# Patient Record
Sex: Female | Born: 1975 | Race: White | Hispanic: Yes | Marital: Married | Smoking: Never smoker
Health system: Southern US, Community
[De-identification: ages and names within clinical notes are randomized; demographics above are authoritative.]

## PROBLEM LIST (undated history)

## (undated) ENCOUNTER — Emergency Department (HOSPITAL_COMMUNITY): Admission: EM | Payer: Self-pay | Source: Home / Self Care

## (undated) ENCOUNTER — Inpatient Hospital Stay (HOSPITAL_COMMUNITY): Payer: Self-pay

## (undated) DIAGNOSIS — Z789 Other specified health status: Secondary | ICD-10-CM

## (undated) DIAGNOSIS — K3533 Acute appendicitis with perforation and localized peritonitis, with abscess: Secondary | ICD-10-CM

## (undated) DIAGNOSIS — R7303 Prediabetes: Secondary | ICD-10-CM

## (undated) DIAGNOSIS — E119 Type 2 diabetes mellitus without complications: Secondary | ICD-10-CM

## (undated) HISTORY — DX: Type 2 diabetes mellitus without complications: E11.9

## (undated) HISTORY — PX: APPENDECTOMY: SHX54

## (undated) HISTORY — DX: Prediabetes: R73.03

---

## 2001-04-15 ENCOUNTER — Encounter: Payer: Self-pay | Admitting: Emergency Medicine

## 2001-04-15 ENCOUNTER — Emergency Department (HOSPITAL_COMMUNITY): Admission: EM | Admit: 2001-04-15 | Discharge: 2001-04-15 | Payer: Self-pay | Admitting: Emergency Medicine

## 2005-09-28 ENCOUNTER — Emergency Department (HOSPITAL_COMMUNITY): Admission: EM | Admit: 2005-09-28 | Discharge: 2005-09-28 | Payer: Self-pay | Admitting: Emergency Medicine

## 2005-10-03 ENCOUNTER — Emergency Department (HOSPITAL_COMMUNITY): Admission: EM | Admit: 2005-10-03 | Discharge: 2005-10-03 | Payer: Self-pay | Admitting: Family Medicine

## 2007-12-16 ENCOUNTER — Ambulatory Visit (HOSPITAL_COMMUNITY): Admission: RE | Admit: 2007-12-16 | Discharge: 2007-12-16 | Payer: Self-pay | Admitting: Obstetrics & Gynecology

## 2008-05-07 ENCOUNTER — Ambulatory Visit: Payer: Self-pay | Admitting: Obstetrics & Gynecology

## 2008-05-07 ENCOUNTER — Ambulatory Visit: Payer: Self-pay | Admitting: Obstetrics and Gynecology

## 2008-05-07 ENCOUNTER — Inpatient Hospital Stay (HOSPITAL_COMMUNITY): Admission: AD | Admit: 2008-05-07 | Discharge: 2008-05-09 | Payer: Self-pay | Admitting: Obstetrics & Gynecology

## 2008-05-08 ENCOUNTER — Encounter (HOSPITAL_COMMUNITY): Payer: Self-pay | Admitting: Obstetrics and Gynecology

## 2010-01-14 ENCOUNTER — Emergency Department (HOSPITAL_COMMUNITY): Admission: EM | Admit: 2010-01-14 | Discharge: 2010-01-14 | Payer: Self-pay | Admitting: Emergency Medicine

## 2010-10-17 ENCOUNTER — Emergency Department (HOSPITAL_COMMUNITY)
Admission: EM | Admit: 2010-10-17 | Discharge: 2010-10-17 | Payer: Self-pay | Source: Home / Self Care | Admitting: Emergency Medicine

## 2011-01-12 LAB — DIFFERENTIAL
Basophils Absolute: 0.1 10*3/uL (ref 0.0–0.1)
Basophils Relative: 1 % (ref 0–1)
Eosinophils Absolute: 1 10*3/uL — ABNORMAL HIGH (ref 0.0–0.7)
Monocytes Relative: 4 % (ref 3–12)
Neutro Abs: 9.7 10*3/uL — ABNORMAL HIGH (ref 1.7–7.7)
Neutrophils Relative %: 72 % (ref 43–77)

## 2011-01-12 LAB — URINALYSIS, ROUTINE W REFLEX MICROSCOPIC
Bilirubin Urine: NEGATIVE
Glucose, UA: NEGATIVE mg/dL
Ketones, ur: NEGATIVE mg/dL
Nitrite: NEGATIVE
Protein, ur: 30 mg/dL — AB
Specific Gravity, Urine: 1.033 — ABNORMAL HIGH (ref 1.005–1.030)
Urobilinogen, UA: 0.2 mg/dL (ref 0.0–1.0)
pH: 8.5 — ABNORMAL HIGH (ref 5.0–8.0)

## 2011-01-12 LAB — BASIC METABOLIC PANEL
BUN: 20 mg/dL (ref 6–23)
CO2: 25 mEq/L (ref 19–32)
Calcium: 9.2 mg/dL (ref 8.4–10.5)
Creatinine, Ser: 0.66 mg/dL (ref 0.4–1.2)
GFR calc non Af Amer: >60 mL/min (ref >60)
Glucose, Bld: 137 mg/dL — ABNORMAL HIGH (ref 70–99)
Sodium: 139 mEq/L (ref 135–145)

## 2011-01-12 LAB — URINE MICROSCOPIC-ADD ON

## 2011-01-12 LAB — CBC
MCH: 31 pg (ref 26.0–34.0)
MCHC: 34 g/dL (ref 30.0–36.0)
Platelets: 308 10*3/uL (ref 150–400)
RDW: 12.9 % (ref 11.5–15.5)

## 2011-01-12 LAB — POCT PREGNANCY, URINE: Preg Test, Ur: NEGATIVE

## 2011-01-26 LAB — URINALYSIS, ROUTINE W REFLEX MICROSCOPIC
Glucose, UA: NEGATIVE mg/dL
Ketones, ur: NEGATIVE mg/dL
Leukocytes, UA: NEGATIVE
Protein, ur: 30 mg/dL — AB
Urobilinogen, UA: 0.2 mg/dL (ref 0.0–1.0)

## 2011-01-26 LAB — COMPREHENSIVE METABOLIC PANEL
Albumin: 4.4 g/dL (ref 3.5–5.2)
Alkaline Phosphatase: 69 U/L (ref 39–117)
BUN: 17 mg/dL (ref 6–23)
Calcium: 8.4 mg/dL (ref 8.4–10.5)
Creatinine, Ser: 0.69 mg/dL (ref 0.4–1.2)
Potassium: 3.8 mEq/L (ref 3.5–5.1)
Total Protein: 8.2 g/dL (ref 6.0–8.3)

## 2011-01-26 LAB — CBC
Hemoglobin: 14.6 g/dL (ref 12.0–15.0)
MCV: 92.4 fL (ref 78.0–100.0)
Platelets: 255 10*3/uL (ref 150–400)
RBC: 4.65 MIL/uL (ref 3.87–5.11)
RDW: 13.5 % (ref 11.5–15.5)
WBC: 13.4 10*3/uL — ABNORMAL HIGH (ref 4.0–10.5)

## 2011-01-26 LAB — DIFFERENTIAL
Lymphocytes Relative: 8 % — ABNORMAL LOW (ref 12–46)
Lymphs Abs: 1.1 10*3/uL (ref 0.7–4.0)
Monocytes Absolute: 0.8 10*3/uL (ref 0.1–1.0)
Monocytes Relative: 6 % (ref 3–12)
Neutro Abs: 10.4 10*3/uL — ABNORMAL HIGH (ref 1.7–7.7)
Neutrophils Relative %: 77 % (ref 43–77)

## 2011-01-26 LAB — URINE MICROSCOPIC-ADD ON

## 2011-07-30 LAB — CBC
HCT: 36.8
Hemoglobin: 12.8
MCHC: 34.6
MCV: 96.5
RBC: 3.82 — ABNORMAL LOW
RDW: 14.7

## 2011-11-03 NOTE — L&D Delivery Note (Cosign Needed)
  Carrie Buchanan, Carrie Buchanan [[id number]]  Delivery Note At 7:31 PM a viable female was delivered via Vaginal, Spontaneous Delivery (Presentation: OA).  APGAR: 9, 9; weight 6 lb 2.4 oz (2790 g).   Placenta status: intact, normal, to pathology.  Cord: 3 vessels with the following complications: None.  Anesthesia: Epidural  Episiotomy: None Lacerations: None Est. Blood Loss (mL): 450  Mom to postpartum.  Baby to nursery-stable.  [name] 04/19/2012, 7:56 PM     [name], Colin Broach [[id number]]  Delivery Note At 7:38 PM a viable female was delivered via  (Presentation: double footling breech extraction).  APGAR: 5, 9; weight 6 lb 7 oz (2920 g).   Placenta status: intact, normal, to pathology.  Cord:  with the following complications: None  Anesthesia:  See above Lacerations: None Est. Blood Loss (mL): 450  Mom to postpartum.  Baby to nursery-stable.  [name] 04/19/2012, 7:56 PM  Supervised by Dr. Shawnie Pons and Wynelle Bourgeois, CNM

## 2011-12-14 ENCOUNTER — Other Ambulatory Visit (HOSPITAL_COMMUNITY): Payer: Self-pay | Admitting: Physician Assistant

## 2011-12-14 DIAGNOSIS — Z3689 Encounter for other specified antenatal screening: Secondary | ICD-10-CM

## 2011-12-14 LAB — CYTOLOGY - NON PAP: CYTOLOGY - PAP: NEGATIVE

## 2011-12-14 LAB — CBC
HCT: 37 % (ref 36–46)
Hemoglobin: 11.5 g/dL — AB (ref 12.0–16.0)
Platelets: 260 10*3/uL (ref 150–399)

## 2011-12-14 LAB — RUBELLA ANTIBODY, IGM: Rubella: IMMUNE

## 2011-12-14 LAB — HIV ANTIBODY (ROUTINE TESTING W REFLEX): HIV: NONREACTIVE

## 2011-12-14 LAB — ABO/RH

## 2011-12-17 ENCOUNTER — Ambulatory Visit (HOSPITAL_COMMUNITY)
Admission: RE | Admit: 2011-12-17 | Discharge: 2011-12-17 | Disposition: A | Discharge status: Home / Self Care | Payer: Medicaid Other | Source: Ambulatory Visit | Attending: Physician Assistant | Admitting: Physician Assistant

## 2011-12-17 ENCOUNTER — Other Ambulatory Visit (HOSPITAL_COMMUNITY): Payer: Self-pay | Admitting: Physician Assistant

## 2011-12-17 DIAGNOSIS — O358XX0 Maternal care for other (suspected) fetal abnormality and damage, not applicable or unspecified: Secondary | ICD-10-CM | POA: Insufficient documentation

## 2011-12-17 DIAGNOSIS — Z363 Encounter for antenatal screening for malformations: Secondary | ICD-10-CM | POA: Insufficient documentation

## 2011-12-17 DIAGNOSIS — Z3689 Encounter for other specified antenatal screening: Secondary | ICD-10-CM

## 2011-12-17 DIAGNOSIS — Z1389 Encounter for screening for other disorder: Secondary | ICD-10-CM | POA: Insufficient documentation

## 2011-12-17 DIAGNOSIS — O09299 Supervision of pregnancy with other poor reproductive or obstetric history, unspecified trimester: Secondary | ICD-10-CM | POA: Insufficient documentation

## 2012-01-01 DIAGNOSIS — O0930 Supervision of pregnancy with insufficient antenatal care, unspecified trimester: Secondary | ICD-10-CM | POA: Insufficient documentation

## 2012-01-01 DIAGNOSIS — O30049 Twin pregnancy, dichorionic/diamniotic, unspecified trimester: Secondary | ICD-10-CM

## 2012-01-01 DIAGNOSIS — O0990 Supervision of high risk pregnancy, unspecified, unspecified trimester: Secondary | ICD-10-CM | POA: Insufficient documentation

## 2012-01-01 DIAGNOSIS — O30009 Twin pregnancy, unspecified number of placenta and unspecified number of amniotic sacs, unspecified trimester: Secondary | ICD-10-CM | POA: Insufficient documentation

## 2012-01-01 DIAGNOSIS — O09529 Supervision of elderly multigravida, unspecified trimester: Secondary | ICD-10-CM | POA: Insufficient documentation

## 2012-01-04 ENCOUNTER — Ambulatory Visit (INDEPENDENT_AMBULATORY_CARE_PROVIDER_SITE_OTHER): Payer: Self-pay | Admitting: Obstetrics & Gynecology

## 2012-01-04 DIAGNOSIS — O09529 Supervision of elderly multigravida, unspecified trimester: Secondary | ICD-10-CM

## 2012-01-04 DIAGNOSIS — O0990 Supervision of high risk pregnancy, unspecified, unspecified trimester: Secondary | ICD-10-CM

## 2012-01-04 DIAGNOSIS — O0930 Supervision of pregnancy with insufficient antenatal care, unspecified trimester: Secondary | ICD-10-CM

## 2012-01-04 DIAGNOSIS — O30009 Twin pregnancy, unspecified number of placenta and unspecified number of amniotic sacs, unspecified trimester: Secondary | ICD-10-CM

## 2012-01-04 DIAGNOSIS — O30049 Twin pregnancy, dichorionic/diamniotic, unspecified trimester: Secondary | ICD-10-CM

## 2012-01-04 LAB — POCT URINALYSIS DIP (DEVICE)
Bilirubin Urine: NEGATIVE
Ketones, ur: NEGATIVE mg/dL
Protein, ur: NEGATIVE mg/dL
Specific Gravity, Urine: 1.02 (ref 1.005–1.030)
pH: 7 (ref 5.0–8.0)

## 2012-01-04 NOTE — Progress Notes (Signed)
U/S scheduled January 14, 2012 at 1030am

## 2012-01-04 NOTE — Progress Notes (Signed)
First visit at [facility].  No problems thus far.  Last scan was on 2/14, next scan to be scheduled.  No other complaints or concerns.  Fetal movement and labor precautions reviewed.  To meet with SW and Nutrition today.

## 2012-01-04 NOTE — Progress Notes (Signed)
Nutrition Note: (1st [facility] consult) Pt seen today for 1st nutrition consult. Dx. Twin gestation, overweight prior to pregnancy, late [facility], AMA. Pt reports pg wt of 140#, current wt of 142.5# @ [redacted]w[redacted]d gestation is inadequate and plots 10#< expected.  Pt reports good intake with no N/V, no food allergies.  Pt eats a wide variety of foods and reports 4 meals/24 hours.  Pt agrees to increase overall intake to 5-6 smaller meals.   Pt does receive WIC services and plans to BF. Follow up in 4 weeks.  Cy Blamer, RD

## 2012-01-04 NOTE — Patient Instructions (Signed)
Return to clinic for any obstetric concerns or go to [facility] for evaluation  

## 2012-01-04 NOTE — Progress Notes (Signed)
Pulse 92 Pain in back and legs

## 2012-01-05 ENCOUNTER — Encounter: Payer: Self-pay | Admitting: *Deleted

## 2012-01-14 ENCOUNTER — Ambulatory Visit (HOSPITAL_COMMUNITY)
Admission: RE | Admit: 2012-01-14 | Discharge: 2012-01-14 | Disposition: A | Discharge status: Home / Self Care | Payer: Medicaid Other | Source: Ambulatory Visit | Attending: Obstetrics & Gynecology | Admitting: Obstetrics & Gynecology

## 2012-01-14 ENCOUNTER — Inpatient Hospital Stay (HOSPITAL_COMMUNITY): Payer: Medicaid Other

## 2012-01-14 ENCOUNTER — Other Ambulatory Visit: Payer: Self-pay | Admitting: Obstetrics & Gynecology

## 2012-01-14 ENCOUNTER — Inpatient Hospital Stay (HOSPITAL_COMMUNITY)
Admission: AD | Admit: 2012-01-14 | Discharge: 2012-01-22 | DRG: 782 | Disposition: A | Discharge status: Home / Self Care | Payer: Medicaid Other | Attending: Obstetrics & Gynecology | Admitting: Obstetrics & Gynecology

## 2012-01-14 DIAGNOSIS — O0930 Supervision of pregnancy with insufficient antenatal care, unspecified trimester: Secondary | ICD-10-CM

## 2012-01-14 DIAGNOSIS — O09529 Supervision of elderly multigravida, unspecified trimester: Secondary | ICD-10-CM

## 2012-01-14 DIAGNOSIS — O30049 Twin pregnancy, dichorionic/diamniotic, unspecified trimester: Secondary | ICD-10-CM

## 2012-01-14 DIAGNOSIS — O30009 Twin pregnancy, unspecified number of placenta and unspecified number of amniotic sacs, unspecified trimester: Secondary | ICD-10-CM | POA: Insufficient documentation

## 2012-01-14 DIAGNOSIS — O0990 Supervision of high risk pregnancy, unspecified, unspecified trimester: Secondary | ICD-10-CM

## 2012-01-14 DIAGNOSIS — O26879 Cervical shortening, unspecified trimester: Secondary | ICD-10-CM

## 2012-01-14 LAB — TYPE AND SCREEN
ABO/RH(D): O POS
Antibody Screen: NEGATIVE

## 2012-01-14 LAB — CBC
MCHC: 33.9 g/dL (ref 30.0–36.0)
Platelets: 260 10*3/uL (ref 150–400)
RDW: 14.4 % (ref 11.5–15.5)
WBC: 12.2 10*3/uL — ABNORMAL HIGH (ref 4.0–10.5)

## 2012-01-14 LAB — BASIC METABOLIC PANEL
BUN: 9 mg/dL (ref 6–23)
CO2: 22 mEq/L (ref 19–32)
GFR calc non Af Amer: >90 mL/min (ref >90)
Glucose, Bld: 89 mg/dL (ref 70–99)
Potassium: 3.9 mEq/L (ref 3.5–5.1)

## 2012-01-14 LAB — WET PREP, GENITAL
Clue Cells Wet Prep HPF POC: NONE SEEN
Trich, Wet Prep: NONE SEEN
Yeast Wet Prep HPF POC: NONE SEEN

## 2012-01-14 MED ORDER — MAGNESIUM SULFATE 40 G IN LACTATED RINGERS - SIMPLE
2.0000 g/h | INTRAVENOUS | Status: DC
  Administered 2012-01-15 – 2012-01-16 (×2): 2 g/h via INTRAVENOUS
  Filled 2012-01-14 (×3): qty 500

## 2012-01-14 MED ORDER — ZOLPIDEM TARTRATE 10 MG PO TABS: 10.0000 mg | ORAL_TABLET | Freq: Every evening | ORAL | Status: DC | PRN

## 2012-01-14 MED ORDER — DOCUSATE SODIUM 100 MG PO CAPS
100.0000 mg | ORAL_CAPSULE | Freq: Every day | ORAL | Status: DC
  Administered 2012-01-14 – 2012-01-22 (×9): 100 mg via ORAL
  Filled 2012-01-14 (×8): qty 1

## 2012-01-14 MED ORDER — PRENATAL MULTIVITAMIN CH
1.0000 | ORAL_TABLET | Freq: Every day | ORAL | Status: DC
  Administered 2012-01-14 – 2012-01-22 (×9): 1 via ORAL
  Filled 2012-01-14 (×9): qty 1

## 2012-01-14 MED ORDER — BETAMETHASONE SOD PHOS & ACET 6 (3-3) MG/ML IJ SUSP
12.0000 mg | INTRAMUSCULAR | Status: AC
  Administered 2012-01-14 – 2012-01-15 (×2): 12 mg via INTRAMUSCULAR
  Filled 2012-01-14 (×2): qty 2

## 2012-01-14 MED ORDER — MAGNESIUM SULFATE BOLUS VIA INFUSION
6.0000 g | Freq: Once | INTRAVENOUS | Status: AC
  Administered 2012-01-14: 6 g via INTRAVENOUS
  Filled 2012-01-14: qty 500

## 2012-01-14 MED ORDER — CALCIUM CARBONATE ANTACID 500 MG PO CHEW: 2.0000 | CHEWABLE_TABLET | ORAL | Status: DC | PRN

## 2012-01-14 MED ORDER — PENICILLIN G POTASSIUM 5000000 UNITS IJ SOLR
5.0000 10*6.[IU] | Freq: Once | INTRAVENOUS | Status: DC
  Administered 2012-01-14: 5 10*6.[IU] via INTRAVENOUS
  Filled 2012-01-14: qty 5

## 2012-01-14 MED ORDER — ACETAMINOPHEN 325 MG PO TABS: 650.0000 mg | ORAL_TABLET | ORAL | Status: DC | PRN

## 2012-01-14 MED ORDER — PROGESTERONE MICRONIZED 200 MG PO CAPS
200.0000 mg | ORAL_CAPSULE | Freq: Every day | ORAL | Status: DC
  Administered 2012-01-14 – 2012-01-21 (×8): 200 mg via VAGINAL
  Filled 2012-01-14 (×9): qty 1

## 2012-01-14 MED ORDER — PENICILLIN G POTASSIUM 5000000 UNITS IJ SOLR
2.5000 10*6.[IU] | INTRAVENOUS | Status: DC
  Administered 2012-01-14 – 2012-01-18 (×21): 2.5 10*6.[IU] via INTRAVENOUS
  Filled 2012-01-14 (×25): qty 2.5

## 2012-01-14 MED ORDER — LACTATED RINGERS IV SOLN
INTRAVENOUS | Status: DC
  Administered 2012-01-14 – 2012-01-15 (×2): via INTRAVENOUS
  Administered 2012-01-15: 100 mL/h via INTRAVENOUS
  Administered 2012-01-16 – 2012-01-17 (×4): via INTRAVENOUS

## 2012-01-14 NOTE — Progress Notes (Signed)
Pt. C/o of SOB, pulse ox placed on left index finger, sats 99-100%, lung clear bi-laterally. Dr. Macon Large notified - no new orders received.

## 2012-01-14 NOTE — Consult Note (Signed)
Asked to provide prenatal consultation for patient at risk for preterm delivery of twins.  Mother is 35y.o. G7 P5 Ab1 who is now 24.[redacted] weeks EGA, with pregnancy previously otherwise uncomplicated but cervical shortening noted on routine [place] today.  She is being treated with betamethasone, mag SO4, and PCN (pending GBS results).  With assistance of interpreter I discussed with patient and FOB usual expectations for preterm infants at 24+ weeks gestation, including possible needs for DR resuscitation, respiratory support, IV access, and blood products.  Also presented risks of death or serious morbidity, including brain, lung, gut, or eye complications. Projected possible length of stay in NICU until 36 - [redacted] wks EGA.  Discussed advantages of feeding with mother's milk, and encouraged her to consider pumping postnatally (she intends to pump and breast feed).  Patient was attentive, FOB also attentive and had appropriate questions, thanked me for my input.  Thank you for the consultation.

## 2012-01-14 NOTE — Consult Note (Signed)
MATERNAL FETAL MEDICINE CONSULT  Patient Name: Carrie Buchanan Medical Record Number:  [id number] Date of Birth: 1976-08-18 Requesting Physician Name:  Tereso Newcomer, MD Date of Service: 01/14/2012  Chief Complaint Cervical Insufficiency  History of Present Illness Carrie Buchanan was seen today due to cervical insufficiency at the request of Tereso Newcomer, MD.  The patient is a 36 y.o. [id number], pregnancy with dichorionic diamniotic twins at [redacted]w[redacted]d with an EDD of 05/04/2012, by Ultrasound dating method.  She was seen earlier today for a routine ultrasound and was noted to have a cervical length of 0.9 cm. She denies pain, cramping, contractions, vaginal bleeding, or leakage of fluid. Her fetuses are moving normally.   Review of Systems Pertinent items are noted in HPI.  Patient History OB History    Grav Para Term Preterm Abortions TAB SAB Ect Mult Living   7 5 5  1  1   5      # Outc Date GA Lbr Len/2nd Wgt Sex Del Anes PTL Lv   1 TRM 1993 [redacted]w[redacted]d 05:00 6lb(2.722kg) F SVD None No    2 TRM 1994 [redacted]w[redacted]d 04:00 7lb(3.175kg) M SVD None No Yes   3 TRM 1996 [redacted]w[redacted]d 04:00 7lb(3.175kg) M SVD None No Yes   4 TRM 2000 [redacted]w[redacted]d 40:00 6lb(2.722kg) M SVD None  Yes   5 SAB 2004 [redacted]w[redacted]d          6 TRM 2009 [redacted]w[redacted]d 05:00 7lb(3.175kg) F SVD   Yes   Comments: induction   7 CUR               No past medical history on file.  No past surgical history on file.  History   Social History  . Marital Status: Single    Spouse Name: N/A    Number of Children: N/A  . Years of Education: N/A   Social History Main Topics  . Smoking status: Never Smoker   . Smokeless tobacco: Not on file  . Alcohol Use: No  . Drug Use: No  . Sexually Active: Yes   Other Topics Concern  . Not on file   Social History Narrative  . No narrative on file    Family History  Problem Relation Age of Onset  . Asthma Mother    In addition, the patient has no family history of recurrent miscarriage, cervical  insufficiency mental retardation, birth defects, or genetic diseases.  Physical Examination Filed Vitals:   01/14/12 1240  BP: 111/61  Pulse: 77  Temp: 98.4 F (36.9 C)  Resp: 18   General appearance - alert, well appearing, and in no distress Abdomen - soft, nontender, nondistended, no masses or organomegaly  Assessment and Recommendations 1. Cervical Insufficiency. As the patient is already receiving magnesium sulfate for neuroprotection and betamethasone to accelerate fetal maturity, I have few additional recommendations as there are no other interventions of proven benefit in this situation. A cerclage is contra-indicated in this situation both due to her gestational age and twin pregnancy. If rupture of membranes occurs and/or labor ensues a vaginal delivery can be attempted as the presenting fetus is vertex. However, the second twin is not a candidate for a breech vaginal delivery due to its small size and high risk of head entrapment. Thus, performing a primary cesarean if delivery is required would be a reasonable alternative to attempting a vaginal delivery.  Rema Fendt, MD

## 2012-01-14 NOTE — H&P (Signed)
Attending Antepartum Admission History and Physical  Carrie Buchanan is a 36 y.o. [phone] with di/di twin gestation at [redacted]w[redacted]d admitted after only 0.9cm cervical length was noted on routine ultrasound today. Patient denies any contractions, LOF, VB. Reports good FM x 2. Twins were noted to have good FHR, good concordance in weight, normal AFV x2, no other concerning signs or symptoms. Internal os was noted to be dilated to about 3.2 cm with funnelling, 0.9cm measurable cervical length. Fetal presentation is cephalic for Twin A and transverse, head maternal right for Twin B.  Patient Active Problem List  Diagnoses  . Twin pregnancy, antepartum  . AMA (advanced maternal age) multigravida 35+  . Late prenatal care  . High-risk pregnancy supervision  . Short cervix, antepartum, twins   Past Medical History: No past medical history on file.  Past Surgical History: No past surgical history on file.  Obstetrical History: OB History    Grav Para Term Preterm Abortions TAB SAB Ect Mult Living   7 5 5  1  1   5     SVD at term x 5, no complications.  SAB x 1  Gynecological History: Noncontributory. No history of cervical dysplasia or STDs.  Social History: History   Social History  . Marital Status: Single    Spouse Name: N/A    Number of Children: N/A  . Years of Education: N/A   Social History Main Topics  . Smoking status: Never Smoker   . Smokeless tobacco: Not on file  . Alcohol Use: No  . Drug Use: No  . Sexually Active: Yes   Other Topics Concern  . Not on file   Social History Narrative  . No narrative on file   Family History: Family History  Problem Relation Age of Onset  . Asthma Mother    Allergies: No Known Allergies  Prescriptions prior to admission  Medication Sig Dispense Refill  . prenatal vitamin w/FE, FA (PRENATAL 1 + 1) 27-1 MG TABS Take 1 tablet by mouth daily.       Review of Systems: Negative  Vitals:  Temperature 98.4 F (36.9 C),  temperature source Oral, resp. rate 18, height 4\' 11"  (1.499 m). Physical Examination:  General appearance: alert, well appearing, and in no distress  Chest: clear to auscultation, no wheezes, rales or rhonchi, symmetric air entry  Heart: normal rate and regular rhythm  Abdomen: gravid and fundal height is consistent with twins  Cervix: Evaluated by sterile speculum exam., Position: mid position, Dilation: 0 cm visually Thickness: 70% effaced and Consistency: soft Station: Floating and fetal presentation is cephalic and transverse on ultrasound.  Extremities: extremities normal, atraumatic, no cyanosis or edema with DTRs 2+ bilaterally  Membranes:intact  Fetal Monitoring: Baseline is 130s for Twin A, 140s for Twin B; moderate variability x 2, no accels x 2, no decels x 2  Labs:  No results found for this or any previous visit (from the past 24 hour(s)).  Imaging Studies: 12/17/2011  OBSTETRICAL ULTRASOUND:  Twin A EFW 770g/71%, normal AFV, cephalic, normal anatomy, female; and Twin B EFW 697g/58%, normal AFV, tranverse, normal anatomy, female; 9% discordance ; 0.9 cm cervical length, internal os open to 3.2 cm, funneling noted.    . betamethasone acetate-betamethasone sodium phosphate  12 mg Intramuscular Q24H  . docusate sodium  100 mg Oral Daily  . magnesium  6 g Intravenous Once  . pencillin G potassium IV  5 Million Units Intravenous Once   Followed by  . Robley Fries  G potassium IV  2.5 Million Units Intravenous Q4H  . prenatal multivitamin  1 tablet Oral Daily  . progesterone  200 mg Vaginal QHS   I have reviewed the patient's current medications.  ASSESSMENT AND PLAN: Patient Active Problem List  Diagnoses  . Twin pregnancy, antepartum  . AMA (advanced maternal age) multigravida 35+  . Late prenatal care  . High-risk pregnancy supervision  . Short cervix, antepartum, twins  Admit to Antenatal  Bedrest, Trendelenberg position, admission labs ordered  BMZ regimen, magnesium  sulfate ordered for tocolysis and neuroprotection  Progesterone per vagina ordered to help with cervix  PCN ordered for GBS unknown in setting of prematurity  NICU and MFM consults ordered  Routine antenatal care  Jaynie Collins, M.D. 01/14/2012 1:04 PM

## 2012-01-15 ENCOUNTER — Encounter (HOSPITAL_COMMUNITY): Payer: Self-pay | Admitting: *Deleted

## 2012-01-15 DIAGNOSIS — O26879 Cervical shortening, unspecified trimester: Principal | ICD-10-CM

## 2012-01-15 LAB — GC/CHLAMYDIA PROBE AMP, GENITAL: Chlamydia, DNA Probe: NEGATIVE

## 2012-01-15 NOTE — Progress Notes (Signed)
FACULTY PRACTICE ANTEPARTUM(COMPREHENSIVE) NOTE  Carrie Buchanan is a 36 y.o. [phone] with di/di twin gestation at [redacted]w[redacted]d  admitted after only 0.9cm cervical length was noted on routine ultrasound on 01/14/12. Patient denies any contractions, LOF, VB. Reports good FM x 2. Twins were noted to have good FHR, good concordance in weight, normal AFV x2, no other concerning signs or symptoms. Internal os was noted to be dilated to about 3.2 cm with funnelling, 0.9cm measurable cervical length.   Fetal presentation is cephalic for Twin A and transverse, head maternal right for Twin B.  Length of Stay:  1  Days  Subjective: No complaints. Patient reports good fetal movement x 2.  She reports no uterine contractions, no bleeding and no loss of fluid per vagina. Spanish interpreter present for this encounter.  Vitals:  Blood pressure 102/64, pulse 83, temperature 98 F (36.7 C), temperature source Oral, resp. rate 20, height 4\' 11"  (1.499 m), SpO2 98.00%. Physical Examination: General appearance - alert, well appearing, and in no distress  Abdomen - soft, nontender, nondistended, no masses or organomegaly  Fundal Height: size equals dates  Pelvic Exam: Deferred  Extremities: extremities normal, atraumatic, no cyanosis or edema, Homans sign is negative, no sign of DVT and SCD are in place with DTRs 2+ bilaterally  Membranes:intact   Fetal Monitoring: For both twins: Baseline: 130s bpm, Variability: moderate, Accelerations: Non-reactive but appropriate for gestational age and Decelerations: Absent   Labs:  Recent Results (from the past 24 hour(s))  WET PREP, GENITAL   Collection Time   01/14/12 12:36 PM      Component Value Range   Yeast Wet Prep HPF POC NONE SEEN  NONE SEEN    Trich, Wet Prep NONE SEEN  NONE SEEN    Clue Cells Wet Prep HPF POC NONE SEEN  NONE SEEN    WBC, Wet Prep HPF POC MANY (*) NONE SEEN   CBC   Collection Time   01/14/12  1:00 PM      Component Value Range   WBC 12.2  (*) 4.0 - 10.5 (K/uL)   RBC 3.48 (*) 3.87 - 5.11 (MIL/uL)   Hemoglobin 11.2 (*) 12.0 - 15.0 (g/dL)   HCT 45.4 (*) 09.8 - 46.0 (%)   MCV 94.8  78.0 - 100.0 (fL)   MCH 32.2  26.0 - 34.0 (pg)   MCHC 33.9  30.0 - 36.0 (g/dL)   RDW 11.9  14.7 - 82.9 (%)   Platelets 260  150 - 400 (K/uL)  TYPE AND SCREEN   Collection Time   01/14/12  1:00 PM      Component Value Range   ABO/RH(D) O POS     Antibody Screen NEG     Sample Expiration 01/17/2012    BASIC METABOLIC PANEL   Collection Time   01/14/12  1:00 PM      Component Value Range   Sodium 133 (*) 135 - 145 (mEq/L)   Potassium 3.9  3.5 - 5.1 (mEq/L)   Chloride 102  96 - 112 (mEq/L)   CO2 22  19 - 32 (mEq/L)   Glucose, Bld 89  70 - 99 (mg/dL)   BUN 9  6 - 23 (mg/dL)   Creatinine, Ser 5.62 (*) 0.50 - 1.10 (mg/dL)   Calcium 9.2  8.4 - 13.0 (mg/dL)   GFR calc non Af Amer >90  >90 (mL/min)   GFR calc Af Amer >90  >90 (mL/min)  ABO/RH   Collection Time   01/14/12  1:00  PM      Component Value Range   ABO/RH(D) O POS      Imaging Studies:    12/17/2011 OBSTETRICAL ULTRASOUND: Twin A EFW 770g/71%, normal AFV, cephalic, normal anatomy, female; and Twin B EFW 697g/58%, normal AFV, tranverse, normal anatomy, female; 9% discordance ; 0.9 cm cervical length, internal os open to 3.2 cm, funneling noted.   Medications:  Scheduled    . betamethasone acetate-betamethasone sodium phosphate  12 mg Intramuscular Q24H  . docusate sodium  100 mg Oral Daily  . magnesium  6 g Intravenous Once  . pencillin G potassium IV  5 Million Units Intravenous Once   Followed by  . pencillin G potassium IV  2.5 Million Units Intravenous Q4H  . prenatal multivitamin  1 tablet Oral Daily  . progesterone  200 mg Vaginal QHS   I have reviewed the patient's current medications.  ASSESSMENT: Patient Active Problem List  Diagnoses  . Twin pregnancy, antepartum  . AMA (advanced maternal age) multigravida 35+  . Late prenatal care  . High-risk pregnancy supervision    . Short cervix, antepartum, twins    PLAN: Continue bedrest, trendelenberg position  Continue BMZ regimen, magnesium sulfate for tocolysis and neuroprotection  Continue progesterone per vagina to help with cervix  PCN ordered for GBS unknown in setting of prematurity; follow up culture results  NICU and MFM consults have been consulted; appreciate their input. Cesarean delivery recommended by MFM for mode of delivery given increased risk of head entrapment for the second twin. Continue routine antenatal care.   Carrie Buchanan A 01/15/2012,7:37 AM

## 2012-01-16 NOTE — Progress Notes (Signed)
Patient ID: Carrie Buchanan, female   DOB: [birth year], 36 y.o.   MRN: [id number] FACULTY PRACTICE ANTEPARTUM(COMPREHENSIVE) NOTE  Carrie Buchanan is a 36 y.o. [id number] with di/di twin gestation at [redacted]w[redacted]d  admitted after only 38 cm cervical length was noted on routine ultrasound on 01/14/12. Patient denies any contractions, LOF, VB. Reports good FM x 2. Twins were noted to have good FHR, good concordance in weight, normal AFV x2, no other concerning signs or symptoms. Internal os was noted to be dilated to about 3.2 cm with funnelling, 0.9cm measurable cervical length.   Fetal presentation is cephalic for Twin A and transverse, head maternal right for Twin B.  Length of Stay:  2  Days  Subjective: No complaints. Patient reports good fetal movement x 2.  She reports no uterine contractions, no bleeding and no loss of fluid per vagina. Spanish interpreter present for this encounter.  Vitals:  Blood pressure 104/63, pulse 84, temperature 98.4 F (36.9 C), temperature source Oral, resp. rate 20, height 4\' 11"  (1.499 m), weight 142 lb (64.411 kg), SpO2 98.00%. Physical Examination: General appearance - alert, well appearing, and in no distress  Abdomen - soft, nontender, nondistended, no masses or organomegaly  Fundal Height: size equals dates  Pelvic Exam: Deferred  Extremities: extremities normal, atraumatic, no cyanosis or edema, Homans sign is negative, no sign of DVT and SCD are in place with DTRs 2+ bilaterally  Membranes:intact   Fetal Monitoring: For both twins: Baseline: 130s bpm, Variability: moderate, Accelerations: Non-reactive but appropriate for gestational age and Decelerations: Absent   Labs:  Recent Results (from the past 48 hour(s))  GC/CHLAMYDIA PROBE AMP, GENITAL   Collection Time   01/14/12 12:36 PM      Component Value Range   GC Probe Amp, Genital NEGATIVE  NEGATIVE    Chlamydia, DNA Probe NEGATIVE  NEGATIVE   WET PREP, GENITAL   Collection Time   01/14/12 12:36  PM      Component Value Range   Yeast Wet Prep HPF POC NONE SEEN  NONE SEEN    Trich, Wet Prep NONE SEEN  NONE SEEN    Clue Cells Wet Prep HPF POC NONE SEEN  NONE SEEN    WBC, Wet Prep HPF POC MANY (*) NONE SEEN   CBC   Collection Time   01/14/12  1:00 PM      Component Value Range   WBC 12.2 (*) 4.0 - 10.5 (K/uL)   RBC 3.48 (*) 3.87 - 5.11 (MIL/uL)   Hemoglobin 11.2 (*) 12.0 - 15.0 (g/dL)   HCT 64.3 (*) 32.9 - 46.0 (%)   MCV 94.8  78.0 - 100.0 (fL)   MCH 32.2  26.0 - 34.0 (pg)   MCHC 33.9  30.0 - 36.0 (g/dL)   RDW 51.8  84.1 - 66.0 (%)   Platelets 260  150 - 400 (K/uL)  TYPE AND SCREEN   Collection Time   01/14/12  1:00 PM      Component Value Range   ABO/RH(D) O POS     Antibody Screen NEG     Sample Expiration 01/17/2012    BASIC METABOLIC PANEL   Collection Time   01/14/12  1:00 PM      Component Value Range   Sodium 133 (*) 135 - 145 (mEq/L)   Potassium 3.9  3.5 - 5.1 (mEq/L)   Chloride 102  96 - 112 (mEq/L)   CO2 22  19 - 32 (mEq/L)   Glucose, Bld 89  70 - 99 (  mg/dL)   BUN 9  6 - 23 (mg/dL)   Creatinine, Ser 8.29 (*) 0.50 - 1.10 (mg/dL)   Calcium 9.2  8.4 - 56.2 (mg/dL)   GFR calc non Af Amer >90  >90 (mL/min)   GFR calc Af Amer >90  >90 (mL/min)  ABO/RH   Collection Time   01/14/12  1:00 PM      Component Value Range   ABO/RH(D) O POS        Imaging Studies:    12/17/2011 OBSTETRICAL ULTRASOUND: Twin A EFW 770g/71%, normal AFV, cephalic, normal anatomy, female; and Twin B EFW 697g/58%, normal AFV, tranverse, normal anatomy, female; 9% discordance ; 0.9 cm cervical length, internal os open to 3.2 cm, funneling noted.   Medications:  Scheduled    . betamethasone acetate-betamethasone sodium phosphate  12 mg Intramuscular Q24H  . docusate sodium  100 mg Oral Daily  . pencillin G potassium IV  2.5 Million Units Intravenous Q4H  . prenatal multivitamin  1 tablet Oral Daily  . progesterone  200 mg Vaginal QHS   I have reviewed the patient's current  medications.  ASSESSMENT: Patient Active Problem List  Diagnoses  . Twin pregnancy, antepartum  . AMA (advanced maternal age) multigravida 35+  . Late prenatal care  . High-risk pregnancy supervision  . Short cervix, antepartum, twins    PLAN: Continue bedrest, trendelenberg position  Finished BMZ regimen, continues magnesium sulfate for tocolysis and neuroprotection  Continue progesterone per vagina to help with cervix  PCN ordered for GBS unknown in setting of prematurity; follow up culture results  NICU and MFM consults have been consulted; appreciate their input. Cesarean delivery recommended by MFM for mode of delivery given increased risk of head entrapment for the second twin. Continue routine antenatal care.   Carrie Buchanan H 01/16/2012,7:57 AM

## 2012-01-17 LAB — TYPE AND SCREEN
ABO/RH(D): O POS
Antibody Screen: NEGATIVE

## 2012-01-17 LAB — CULTURE, BETA STREP (GROUP B ONLY): Organism ID, Bacteria: NEGATIVE

## 2012-01-17 MED ORDER — NIFEDIPINE ER 30 MG PO TB24
30.0000 mg | ORAL_TABLET | Freq: Two times a day (BID) | ORAL | Status: DC
  Administered 2012-01-17 – 2012-01-22 (×11): 30 mg via ORAL
  Filled 2012-01-17 (×13): qty 1

## 2012-01-17 MED ORDER — BISACODYL 10 MG RE SUPP
10.0000 mg | Freq: Every day | RECTAL | Status: DC | PRN
  Filled 2012-01-17: qty 1

## 2012-01-17 NOTE — Progress Notes (Signed)
Spanish Interpreter, [place] at the bedside to assist pt. With ordering her meals, security at the bedside due to computer showing 7 visitors in the room.  The pt. Does not have 7 visitors - previous visitors did not sign out.  Two adults and two school age children currently in the room. Interpreter explained visitor's policy to pt. & visitors.

## 2012-01-17 NOTE — Progress Notes (Signed)
Patient ID: Carrie Buchanan, female   DOB: Mar 26, 1976, 36 y.o.   MRN: [id number] FACULTY PRACTICE ANTEPARTUM(COMPREHENSIVE) NOTE  Carrie Buchanan is a 36 y.o. [id number] at [redacted]w[redacted]d by early ultrasound who is admitted for shortened cervix.   Fetal presentation is cephalic and transverse. Length of Stay:  3  Days  Subjective: Doing well, constipated Patient reports the fetal movement as active. Patient reports uterine contraction  activity as regular, every 15-20 minutes. Patient reports  vaginal bleeding as none. Patient describes fluid per vagina as None.  Vitals:  Blood pressure 97/63, pulse 78, temperature 98.1 F (36.7 C), temperature source Oral, resp. rate 18, height 4\' 11"  (1.499 m), weight 66.724 kg (147 lb 1.6 oz), SpO2 97.00%. Physical Examination:  General appearance - alert, well appearing, and in no distress Abdomen - soft, nontender, nondistended, no masses or organomegaly Fundal Height:  size equals dates Extremities: extremities normal, atraumatic, no cyanosis or edema  Membranes:intact  Fetal Monitoring:  Baseline: 140 x 2 bpm  Medications:  Scheduled    . docusate sodium  100 mg Oral Daily  . pencillin G potassium IV  2.5 Million Units Intravenous Q4H  . prenatal multivitamin  1 tablet Oral Daily  . progesterone  200 mg Vaginal QHS   I have reviewed the patient's current medications.  ASSESSMENT: Patient Active Problem List  Diagnoses  . Twin pregnancy, antepartum  . AMA (advanced maternal age) multigravida 35+  . Late prenatal care  . High-risk pregnancy supervision  . Short cervix, antepartum, twins    PLAN: D/C magnesium, slowly increase activity Start Procardia Dulcolax  Carrie Buchanan S 01/17/2012,7:46 AM

## 2012-01-17 NOTE — Progress Notes (Signed)
While giving a.m. Medication, supp. Not given - pt. Stated she just had a BM - "much".

## 2012-01-18 ENCOUNTER — Encounter: Payer: Self-pay | Admitting: Family Medicine

## 2012-01-18 MED ORDER — SODIUM CHLORIDE 0.9 % IJ SOLN
3.0000 mL | Freq: Two times a day (BID) | INTRAMUSCULAR | Status: DC
  Administered 2012-01-18 – 2012-01-21 (×8): 3 mL via INTRAVENOUS

## 2012-01-18 NOTE — Progress Notes (Signed)
Patient ID: Carrie Buchanan, female   DOB: April 02, 1976, 36 y.o.   MRN: [id number] Patient ID: Carrie Buchanan, female   DOB: [birth year], 36 y.o.   MRN: [id number] FACULTY PRACTICE ANTEPARTUM(COMPREHENSIVE) NOTE  Carrie Buchanan is a 36 y.o. [id number] at [redacted]w[redacted]d by early ultrasound who is admitted for shortened cervix.   Fetal presentation is cephalic and transverse. Length of Stay:  5  Days  Subjective: Doing well, constipated Patient reports the fetal movement as active. Patient reports no uterine contractions. Patient reports  vaginal bleeding as none. Patient describes fluid per vagina as None.  Vitals:  Blood pressure 105/56, pulse 85, temperature 97.8 F (36.6 C), temperature source Oral, resp. rate 20, height 4\' 11"  (1.499 m), weight 147 lb 1.6 oz (66.724 kg), SpO2 97.00%. Physical Examination:  General appearance - alert, well appearing, and in no distress Abdomen - soft, nontender, nondistended, no masses or organomegaly Fundal Height:  size equals dates Extremities: extremities normal, atraumatic, no cyanosis or edema  Membranes:intact  Fetal Monitoring:  Baseline: 140 x 2 bpm  Medications:  Scheduled    . docusate sodium  100 mg Oral Daily  . NIFEdipine  30 mg Oral BID  . prenatal multivitamin  1 tablet Oral Daily  . progesterone  200 mg Vaginal QHS  . DISCONTD: pencillin G potassium IV  2.5 Million Units Intravenous Q4H   I have reviewed the patient's current medications.  ASSESSMENT: Patient Active Problem List  Diagnoses  . Twin pregnancy, antepartum  . AMA (advanced maternal age) multigravida 35+  . Late prenatal care  . High-risk pregnancy supervision  . Short cervix, antepartum, twins    PLAN: Status stable D/C magnesium, slowly increase activity Start Procardia Dulcolax  Gardiner Espana H 01/18/2012,7:49 AM

## 2012-01-19 NOTE — Progress Notes (Signed)
Interpreter at the bedside with rn. Offered to pt the option to move across the hall for a better view. Pt ok with her current room and doesn't wish to move at this time. Pt reports that her spirit and emotional state is ok. She is ok with having to stay in the hospital a while. Will let the nurses know if she has any needs or decides she wants to move rooms. Valinda Hoar, RNC

## 2012-01-19 NOTE — Progress Notes (Signed)
Patient ID: Carrie Buchanan, female   DOB: [birth year], 36 y.o.   MRN: [id number] FACULTY PRACTICE ANTEPARTUM(COMPREHENSIVE) NOTE  Elton Catalano is a 36 y.o. [id number] at [redacted]w[redacted]d by midtrimester ultrasound who is admitted for short cervix, funneling 3.2 cm.   Fetal presentation is cephalic, transverse Length of Stay:  5  Days  Subjective: No problems Patient reports the fetal movement as active. Patient reports uterine contraction  activity as none. Patient reports  vaginal bleeding as none. Patient describes fluid per vagina as None.  Vitals:  Blood pressure 108/52, pulse 75, temperature 98.1 F (36.7 C), temperature source Oral, resp. rate 16, height 4\' 11"  (1.499 m), weight 147 lb 1.6 oz (66.724 kg), SpO2 97.00%. Physical Examination:  General appearance - alert, well appearing, and in no distress Heart - normal rate and regular rhythm Abdomen - soft, nontender, nondistended Fundal Height:  consistent with twins Cervical Exam: Not evaluated. and found to be not evaluated/ / and fetal presentation is . Extremities: extremities normal, atraumatic, no cyanosis or edema and Homans sign is negative, no sign of DVT with DTRs 2+ bilaterally Membranes:intact  Fetal Monitoring:  Baseline: 145-155 bpm, Variability: Fair (1-6 bpm) and Decelerations: Absent  Labs:  No results found for this or any previous visit (from the past 24 hour(s)).  Imaging Studies:      Medications:  Scheduled    . docusate sodium  100 mg Oral Daily  . NIFEdipine  30 mg Oral BID  . prenatal multivitamin  1 tablet Oral Daily  . progesterone  200 mg Vaginal QHS  . sodium chloride  3 mL Intravenous Q12H   I have reviewed the patient's current medications.  ASSESSMENT: Patient Active Problem List  Diagnoses  . Twin pregnancy, antepartum  . AMA (advanced maternal age) multigravida 35+  . Late prenatal care  . High-risk pregnancy supervision  . Short cervix, antepartum, twins    PLAN: Continue  present management  Jenni Thew 01/19/2012,7:18 AM

## 2012-01-20 LAB — TYPE AND SCREEN
ABO/RH(D): O POS
Antibody Screen: NEGATIVE

## 2012-01-20 NOTE — Progress Notes (Signed)
Patient ID: Carrie Buchanan, female   DOB: 1976-02-16, 36 y.o.   MRN: [id number] FACULTY PRACTICE ANTEPARTUM(COMPREHENSIVE) NOTE  Carrie Buchanan is a 36 y.o. [id number] at [redacted]w[redacted]d Fetal presentation is cephalic, transverse. S/p BMZ & mag prophylaxis Length of Stay:  6  Days  Subjective: Denies leak, bldg or pain. Reports +FM.   Vitals:  Blood pressure 106/69, pulse 81, temperature 98 F (36.7 C), temperature source Oral, resp. rate 18, height 4\' 11"  (1.499 m), weight 66.724 kg (147 lb 1.6 oz), SpO2 97.00%. Physical Examination:  General appearance - alert, well appearing, and in no distress Fundal Height:  consistent with twins Pelvic Exam:  examination not indicated Cervical Exam: Not evaluated. and found to be not evaluated Extremities: extremities normal, atraumatic, no cyanosis or edema  Membranes:intact  Fetal Monitoring:  Baseline: 150 x 2 bpm; + accels, occ mi variables, reassuring for GA; no ctx per [employer]:  No results found for this or any previous visit (from the past 24 hour(s)).        Medications:  Scheduled    . docusate sodium  100 mg Oral Daily  . NIFEdipine  30 mg Oral BID  . prenatal multivitamin  1 tablet Oral Daily  . progesterone  200 mg Vaginal QHS  . sodium chloride  3 mL Intravenous Q12H   I have reviewed the patient's current medications.  ASSESSMENT: Patient Active Problem List  Diagnoses  . Twin pregnancy, antepartum  . AMA (advanced maternal age) multigravida 35+  . Late prenatal care  . High-risk pregnancy supervision  . Short cervix, antepartum, twins    PLAN: Continue current management  Carrie Buchanan 01/20/2012,7:01 AM

## 2012-01-21 ENCOUNTER — Inpatient Hospital Stay (HOSPITAL_COMMUNITY): Payer: Medicaid Other

## 2012-01-21 NOTE — Progress Notes (Signed)
25 weeks twin gestation, with short cervix.  Height  59" Weight 148 Lbs pre-pregnancy weight 140 Lbs.Pre-pregnancy  BMI 28.3 ( overweight)  IBW 95 Lbs  Total weight gain 8 Lbs. Weight gain goals 31-50 Lbs.  Noted improved weight gain since admission ( 6 Lbs ) Estimated needs: 19-2100 kcal/day, 65-75  grams protein/day, 2.1 liters fluid/day Regular diet tolerated well Current diet prescription will provide for increased needs. No abnormal nutrition related labs  Nutrition Dx: Increased nutrient needs r/t twin pregnancy and fetal growth requirements aeb [redacted] weeks gestation.  No educational needs assessed at this time.

## 2012-01-21 NOTE — Progress Notes (Signed)
Patient ID: Saryah Loper, female   DOB: [birth year], 36 y.o.   MRN: [id number] FACULTY PRACTICE ANTEPARTUM NOTE  Misheel Gowans is a 37 y.o. [id number] at [redacted]w[redacted]d  who is admitted for decreased cervical length.    Length of Stay:  7  Days  Subjective: Patient reports good fetal movement.  She reports no uterine contractions, no bleeding and no loss of fluid per vagina.  Vitals:  Blood pressure 116/61, pulse 101, temperature 98.2 F (36.8 C), temperature source Oral, resp. rate 16, height 4\' 11"  (1.499 m), weight 67.178 kg (148 lb 1.6 oz), SpO2 98.00%. Physical Examination:  General appearance - alert, well appearing, and in no distress Eyes - pupils equal and reactive, extraocular eye movements intact Chest - clear to auscultation, no wheezes, rales or rhonchi, symmetric air entry Heart - normal rate, regular rhythm, normal S1, S2, no murmurs, rubs, clicks or gallops Abdomen - soft, nontender, nondistended, no masses or organomegaly Extremities - peripheral pulses normal, no pedal edema, no clubbing or cyanosis  Fetal Monitoring:  Baseline: 145 and 145 bpm, 10x10 accelerations  Labs:  Recent Results (from the past 24 hour(s))  TYPE AND SCREEN   Collection Time   01/20/12 11:30 AM      Component Value Range   ABO/RH(D) O POS     Antibody Screen NEG     Sample Expiration 01/23/2012     Medications:  Scheduled    . docusate sodium  100 mg Oral Daily  . NIFEdipine  30 mg Oral BID  . prenatal multivitamin  1 tablet Oral Daily  . progesterone  200 mg Vaginal QHS  . sodium chloride  3 mL Intravenous Q12H   I have reviewed the patient's current medications.  ASSESSMENT: Patient Active Problem List  Diagnoses  . Twin pregnancy, antepartum  . AMA (advanced maternal age) multigravida 35+  . Late prenatal care  . High-risk pregnancy supervision  . Short cervix, antepartum, twins    PLAN: Ultrasound for cervical length today. Continue routine antenatal  care.   Tasneem Cormier JEHIEL 01/21/2012,9:09 AM

## 2012-01-22 ENCOUNTER — Ambulatory Visit (HOSPITAL_COMMUNITY): Payer: Medicaid Other

## 2012-01-22 MED ORDER — NIFEDIPINE ER 30 MG PO TB24: 30.0000 mg | ORAL_TABLET | Freq: Two times a day (BID) | ORAL | Status: DC

## 2012-01-22 MED ORDER — DSS 100 MG PO CAPS: 100.0000 mg | ORAL_CAPSULE | Freq: Two times a day (BID) | ORAL | Status: AC

## 2012-01-22 MED ORDER — PROGESTERONE MICRONIZED 200 MG PO CAPS: ORAL_CAPSULE | ORAL | Status: DC

## 2012-01-22 NOTE — Progress Notes (Signed)
Patient ID: Carrie Buchanan, female   DOB: [birth year], 36 y.o.   MRN: [id number] FACULTY PRACTICE ANTEPARTUM(COMPREHENSIVE) NOTE  Carrie Buchanan is a 36 y.o. [id number] at [redacted]w[redacted]d by LMP who is admitted for shortened cervix.   Fetal presentation is cephalic and transverse. Length of Stay:  8  Days  Subjective: Doing well Patient reports the fetal movement as active. Patient reports uterine contraction  activity as irregular, every 10-15 minutes and mild. Patient reports  vaginal bleeding as none. Patient describes fluid per vagina as None.  Vitals:  Blood pressure 101/63, pulse 89, temperature 98 F (36.7 C), temperature source Oral, resp. rate 18, height 4\' 11"  (1.499 m), weight 67.178 kg (148 lb 1.6 oz), SpO2 98.00%. Physical Examination:  General appearance - alert, well appearing, and in no distress Abdomen - soft, nontender, nondistended, no masses or organomegaly Fundal Height:  size equals dates Extremities: extremities normal, atraumatic, no cyanosis or edema  Membranes:intact VE-Cervix is closed/80 Fetal Monitoring:  Baseline: 140 bpm, Variability: Good {> 6 bpm), Accelerations: Non-reactive but appropriate for gestational age, Decelerations: Absent and B baseline:  140 bpm, Variability:  Good (> 6 bpm), Accelerations;  Non-reactive but appropriate for gestational age, Decelerations:  Absent.  Labs:  No results found for this or any previous visit (from the past 24 hour(s)).  Medications:  Scheduled    . docusate sodium  100 mg Oral Daily  . NIFEdipine  30 mg Oral BID  . prenatal multivitamin  1 tablet Oral Daily  . progesterone  200 mg Vaginal QHS  . sodium chloride  3 mL Intravenous Q12H   I have reviewed the patient's current medications.  ASSESSMENT: Patient Active Problem List  Diagnoses  . Twin pregnancy, antepartum  . AMA (advanced maternal age) multigravida 35+  . Late prenatal care  . High-risk pregnancy supervision  . Short cervix, antepartum, twins     PLAN: Consider D/C home.  Will discuss with team.  Cervix is unchanged.  She has someone to come help her at home to maintain bedrest.  Kimothy Kishimoto S 01/22/2012,7:29 AM

## 2012-01-22 NOTE — Discharge Summary (Signed)
Physician Discharge Summary  Patient ID: Carrie Buchanan MRN: [id number] DOB/AGE: 1976/08/28 35 y.o.  Admit date: 01/14/2012 Discharge date: 01/22/2012  Admission Diagnoses: Twin IUP with short cervix  Discharge Diagnoses: same Principal Problem:  *Short cervix, antepartum, twins Active Problems:  Twin pregnancy, antepartum   Discharged Condition: good  Hospital Course: Patient admitted on 3/14 following a routine ultrasound which revealed a short cervix of 0.9 cm. Patient was asymptomatic and denied any cramping pain, contractions or pelvic pressure. Patient was admitted for bedrest and received betamethasone to help facilitate fetal lung maturity, vaginal prometrium and magnesium sulfate for CP prophylaxis. The patient was then started on Procardia as a tocolytic on HD#2. Throughout her hospitalization, fetal status remained reassuring and the patient remained without any symptoms of preterm labor. On HD#7, a repeat ultrasound was obtained with demonstrated an unchanged cervix measuring 0.99cm and her cervical exam remain unchanged since admission. Due to a stable maternal-fetal unit, the patient was found to be stable for discharge on HD#8. Patient assured [place] that she had ample help home to help in the care of her other 5 children to allow her to adhere to strict bed rest. Patient is scheduled to be seen in high risk clinic on 3/28 at 9:30am. Preterm labor precautions reviewed with the patient.   Consults: None  Significant Diagnostic Studies: radiology: Ultrasound: 3/14 CL: 0.9   3/21 CL 0.99  Treatments: bedrest, betamethasone, Magnesium sulfate for seizure prophylaxis, tocolysis with procardia and vaginal prometrium  Discharge Exam: Blood pressure 105/62, pulse 88, temperature 98 F (36.7 C), temperature source Axillary, resp. rate 18, height 4\' 11"  (1.499 m), weight 67.178 kg (148 lb 1.6 oz), SpO2 98.00%. General appearance: alert, cooperative and no distress Pelvic: exam by  Dr. Shawnie Pons- unchanged from admission Extremities: Homans sign is negative, no sign of DVT and no edema, redness or tenderness in the calves or thighs  Disposition:   Discharge Orders    Future Appointments: Provider: Department: Dept Phone: Center:   01/28/2012 9:30 AM Lesly Dukes, MD [facility] [phone] [facility]     Future Orders Please Complete By Expires   Culture, beta strep (group b only)      Comments:   This external order was created through the Results Console.     Medication List  As of 01/22/2012  9:45 AM   TAKE these medications         DSS 100 MG Caps   Take 100 mg by mouth 2 (two) times daily.      NIFEdipine 30 MG 24 hr tablet   Commonly known as: PROCARDIA-XL/ADALAT CC   Take 1 tablet (30 mg total) by mouth 2 (two) times daily.      prenatal multivitamin Tabs   Take 1 tablet by mouth daily.      progesterone 200 MG capsule   Commonly known as: PROMETRIUM   Place 1 capsule (200 mg total) vaginally at bedtime           Follow-up Information    Follow up with [facility] on 01/28/2012. ( at 9:30 am)    Contact information:   [address] [place] [address] [phone]         Signed: Amandalee Lacap 01/22/2012, 9:45 AM

## 2012-01-22 NOTE — Discharge Instructions (Signed)
Sport and exercise psychologist  (Preterm Labor) El parto prematuro comienza antes de la semana 37 de [place]. La duracin de un embarazo normal es de 39 a 41 semanas.  CAUSAS  Generalmente no hay una causa que pueda identificarse. Sin embargo, una de las causas conocidas ms frecuentes son las infecciones. Las infecciones del tero, el cuello, la vagina, el lquido [place], la vejiga, los riones y Teacher, adult education de los pulmones (neumona) pueden hacer que el trabajo de parto se inicie. Otras causas son:   Infecciones urogenitales, como infecciones por hongos y vaginosis bacteriana.   Anormalidades uterinas (forma del tero, sptum uterino, fibromas, hemorragias en la placenta).   Un cuello que ha sido operado y se abre prematuramente.   Malformaciones del beb.   Gestaciones mltiples (mellizos, trillizos y ms).   Ruptura del saco amnitico.  :Otros factores de riesgo del parto prematuro son   Historia previa de Sport and exercise psychologist.   Ruptura prematura de las [place].   La placenta cubre la apertura del cuello (placenta previa).   La placenta se separa del tero (abrupcin placentaria).   El cuello es demasiado dbil para contener al beb en el tero (cuello incompetente).   Hay mucho lquido en el saco amnitico (polihidramnios).   Consumo de drogas o hbito de fumar durante Firefighter.   No aumentar de peso lo suficiente durante el [employer].   Mujeres menores de 18 aos o [address] de [address] [place].   Nivel socioeconmico bajo.   Raza afroamericana.  SNTOMAS  Los signos y sntomas son:   Clicos del tipo menstrual   Contracciones con un intervalo entre 30 y 70 segundos, comienzan a ser regulares, se hacen ms frecuentes y se hacen ms intensas y dolorosas.   Contracciones que comienzan en la parte superior del tero y se expanden hacia abajo, hacia la zona inferior del abdomen y la espalda.   Sensacin de presin en la pelvis o dolor en la espalda.   Aparece una secrecin acuosa o  sanguinolenta por la vagina.  DIAGNSTICO  El diagnstico puede confirmarse:   Con un examen vaginal.   Ecografa del cuello.   Muestra (hisopado) de las secreciones crvico-vaginales. Estas muestras se analizan para buscar la presencia de fibronectina fetal. Esta protena que se encuentra en las secreciones del tero y se asocia con el parto prematuro.   Monitoreo fetal  TRATAMIENTO  Segn el tiempo del Psychiatrist y otras [place], el mdico puede indicar reposo en cama. Si es necesario, le indicarn medicamentos para TEFL teacher las contracciones y apurar la maduracin de los pulmones del feto. Si el trabajo de parto se inicia antes de las 34 semanas de [place], se recomienda la hospitalizacin. El tratamiento depende de las condiciones en que se encuentre la madre y el beb.  PREVENCIN  Hay algunas cosas que [employer] puede hacer para disminuir el riesgo de trabajo de parto prematuro en futuros [employer]. Una mam puede:   Dejar de fumar.   Mantener un peso saludable y evitar sustancias qumicas y drogas innecesarias.   Controlar todo tipo de infeccin.   Informar al mdico si tiene una historia conocida de parto prematuro.  Document Released: 01/26/2008 Document Revised: 10/08/2011 [facility] Patient Information 2012 [place], [place].  Prevencin de Sport and exercise psychologist (Preventing Preterm Labor) Un parto prematuro ocurre cuando la mujer embarazada tiene contracciones uterinas que causan la apertura, el acortamiento y el afinamiento del cuello del tero, antes de las 37 semanas de [place]. Tendr contracciones regulares cada 2 a 3 minutos. Esto generalmente causa  molestias o dolor. CUIDADOS EN EL HOGAR  Consuma una dieta saludable.   [employer] las vitaminas segn le haya indicado el mdico.   Beba una cantidad de lquido suficiente como para Pharmacologist la orina de tono claro o color amarillo plido todos [place].   Descanse y duerma.   No tenga relaciones sexuales si tiene un parto  prematuro o alto riesgo de tenerlo.   Siga las instrucciones del mdico acerca de su [place], los medicamentos y los exmenes.   Evite el estrs.   Evite los esfuerzos extenuantes o la actividad fsica extensa.   No fume.  SOLICITE AYUDA DE INMEDIATO SI:   Tiene contracciones.   Siente dolor abdominal.   Tiene sangrado que proviene de la vagina.   Siente dolor al [employer].   Observa una secrecin anormal que proviene de la vagina.   Tiene una temperatura oral de ms de 102 F (38.9 C).  ASEGRESE DE QUE:  Comprende estas instrucciones.   Controlar su enfermedad.   Solicitar ayuda si no mejora o si empeora.  Document Released: 11/21/2010 Document Revised: 10/08/2011 [facility] Patient Information 2012 [place], [place].

## 2012-01-25 ENCOUNTER — Encounter: Payer: Self-pay | Admitting: Family

## 2012-01-28 ENCOUNTER — Ambulatory Visit (INDEPENDENT_AMBULATORY_CARE_PROVIDER_SITE_OTHER): Payer: Medicaid Other | Admitting: Obstetrics & Gynecology

## 2012-01-28 VITALS — BP 128/77 | Wt 146.7 lb

## 2012-01-28 DIAGNOSIS — O30009 Twin pregnancy, unspecified number of placenta and unspecified number of amniotic sacs, unspecified trimester: Secondary | ICD-10-CM

## 2012-01-28 DIAGNOSIS — O0990 Supervision of high risk pregnancy, unspecified, unspecified trimester: Secondary | ICD-10-CM

## 2012-01-28 DIAGNOSIS — O26879 Cervical shortening, unspecified trimester: Secondary | ICD-10-CM

## 2012-01-28 LAB — POCT URINALYSIS DIP (DEVICE)
Bilirubin Urine: NEGATIVE
Hgb urine dipstick: NEGATIVE
Leukocytes, UA: NEGATIVE
Nitrite: NEGATIVE
Protein, ur: NEGATIVE mg/dL
Urobilinogen, UA: 0.2 mg/dL (ref 0.0–1.0)
pH: 5.5 (ref 5.0–8.0)

## 2012-01-28 LAB — CBC
MCH: 31.8 pg (ref 26.0–34.0)
MCHC: 31.9 g/dL (ref 30.0–36.0)
MCV: 99.7 fL (ref 78.0–100.0)
Platelets: 289 10*3/uL (ref 150–400)
RDW: 14.3 % (ref 11.5–15.5)

## 2012-01-28 LAB — RPR

## 2012-01-28 NOTE — Progress Notes (Signed)
No bleeding, no LOF, rare contractions.  Pt still on procardia and prometrium.  Pts cervix is closed and short, high in vagina, good tone.  Continue current bedrest and meds.

## 2012-01-28 NOTE — Patient Instructions (Signed)
Amamantar al beb (Breastfeeding) LOS BENEFICIOS DE AMAMANTAR Para el beb  La primera leche (calostro ) ayuda al mejor funcionamiento del sistema digestivo del beb.   La leche tiene anticuerpos que provienen de la madre y que ayudan a prevenir las infecciones en el beb.   Hay una menor incidencia de asma, enfermedades alrgicas y SMSI (sndrome de muerte sbita nfantil).   Los nutrientes que contiene la leche materna son mejores que las frmulas para el bibern y favorecen el desarrollo cerebral.   Los bebs amamantados sufren menos gases, clicos y constipacin.  Para la mam  La lactancia materna favorece el desarrollo de un vnculo muy especial entre la madre y el beb.   Es ms conveniente, siempre disponible a la temperatura adecuada y ms econmica que la leche maternizada.   Consume caloras en la madre y la ayuda a perder el peso ganado durante el embarazo.   Favorece la contraccin del tero a su tamao normal, de manera ms rpida y disminuye las hemorragias luego del parto.   Las madres que amamantan tienen menor riesgo de desarrollar cncer de mama.  AMAMNTELO CON FRECUENCIA  Un beb sano, nacido a trmino, puede amamantarse con tanta frecuencia como cada hora, o espaciar las comidas cada tres horas.   Esta frecuencia variar de un beb a otro. Observe al beb cuando manifieste signos de hambre, antes que regirse por el reloj.   Amamntelo tan seguido como el beb lo solicite, o cuando usted sienta la necesidad de aliviar sus mamas.   Despierte al beb si han pasado 3  4 horas desde la ltima comida.   El amamantamiento frecuente la ayudar a producir ms leche y a prevenir problemas de dolor en los pezones e hinchazn de las mamas.  LA POSICIN DEL BEB PARA AMAMANTARLO  Ya sea que se encuentre acostada o sentada, asegrese que el abdomen del beb enfrente el suyo.   Sostenga la mama con el pulgar por arriba y el resto de los dedos por debajo. Asegrese que  sus dedos se encuentren lejos del pezn y de la boca del beb.   Toque suavemente los labios del beb y la mejilla ms cercana a la mama con el dedo o el pezn.   Cuando la boca del beb se abra lo suficiente, introduzca el pezn y la zona oscura que lo rodea tanto como le sea posible dentro de la boca.   Coloque a beb cerca suyo de modo que su nariz y mejillas toquen las mamas al mamar.  LAS COMIDAS  La duracin de cada comida vara de un beb a otro y de una comida a otra.   El beb debe succionar alrededor de dos o tres minutos para que le llegue leche. Esto se denomina "bajada". Por este motivo, permita que el nio se alimente en cada mama todo lo que desee. Terminar de mamar cuando haya recibido la cantidad adecuada de nutrientes.   Para detener la succin coloque su dedo en la comisura de la boca del nio y deslcelo entre sus encas antes de quitarle la mama de la boca. Esto la ayudar a evitar el dolor en los pezones.  REDUCIR LA CONGESTIN DE LAS MAMAS  Durante la primera semana despus del parto, usted puede experimentar congestin en las mamas. Cuando las mamas estn congestionadas, se sienten calientes, llenas y molestas al tacto. Puede reducir la congestin si:   Lo amamanta frecuentemente, cada 2-3 horas. Las mams que amamantan pronto y con frecuencia tienen menos problemas   de congestin.   Coloque bolsas fras livianas entre cada mamada. Esto ayuda a reducir la hinchazn. Envuelva las bolsas de hielo en una toalla liviana para proteger su piel.   Aplique compresas hmedas calientes sobre la mama durante 5 a 10 minutos antes de amamantar al nio. Esto aumenta la circulacin y ayuda a que la leche fluya.   Masajee suavemente la mama antes y durante la alimentacin.   Asegrese que el nio vaca al menos una mama antes de cambiar de lado.   Use un sacaleche para vaciar la mama si el beb se duerme o no se alimenta bien. Tambin podr quitarse la leche con esta bomba si  tiene que volver al trabajo o siente que las mamas estn congestionadas.   Evite los biberones, chupetes o complementar la alimentacin con agua o jugos en lugar de la leche materna.   Verifique que el beb se encuentra en la posicin correcta mientras lo alimenta.   Evite el cansancio, el estrs y la anemia   Use un soutien que sostenga bien sus mamas y evite los que tienen aro.   Consuma una dieta balanceada y beba lquidos en cantidad.  Si sigue estas indicaciones, la congestin debe mejorar en 24 a 48 horas. Si an tiene dificultades, consulte a su asesor en lactancia. TENDR SUFICIENTE LECHE MI BEB? Algunas veces las madres se preocupan acerca de si sus bebs tendrn la leche suficiente. Puede asegurarse que el beb tiene la leche suficiente si:  El beb succiona y escucha que traga activamente.   El nio se alimenta al menos 8 a 12 veces en 24 horas. Alimntelo hasta que se desprenda por sus propios medios o se quede dormido en la primera mama (al menos durante 10 a 20 minutos), luego ofrzcale el otro lado.   El beb moja 5 a 6 paales descartables (6 a 8 paales de tela) en 24 horas cuando tiene 5  6 das de vida.   Tiene al menos 2-3 deposiciones todos los das en los primeros meses. La leche materna es todo el alimento que el beb necesita. No es necesario que el nio ingiera agua o preparados de bibern. De hecho, para ayudar a que sus mamas produzcan ms leche, lo mejor es no darle al beb suplementos durante las primeras semanas.   La materia fecal debe ser blanda y amarillenta.   El beb debe aumentar 112 a 196 g por semana.  CUDESE Cuide sus mamas del siguiente modo:  Bese o dchese diariamente.   No lave sus pezones con jabn.   Comience a amamantar del lado izquierdo en una comida y del lado derecho en la siguiente.   Notar que aumenta el suministro de leche a los 2 a 5 das despus del parto. Puede sentir algunas molestias por la congestin, lo que hace que  sus mamas estn duras y sensibles. La congestin disminuye en 24 a 48 horas. Mientras tanto, aplique toallas hmedas calientes durante 5 a 10 minutos antes de amamantar. Un masaje suave y la extraccin de un poco de leche antes de amamantar ablandarn las mamas y har ms fcil que el beb se agarre. Use un buen sostn y seque al aire los pezones durante 10 a 15 minutos luego de cada alimentacin.   Solo utilice apsitos de algodn.   Utilice lanolina pura sobre los pezones luego de amamantar. No necesita lavarlos luego de alimentar al nio.  Cudese del siguiente modo:   Consuma alimentos bien balanceados y refrigerios nutritivos.     Beba leche, jugos de fruta y agua para satisfacer la sed (alrededor de 8 vasos por da).   Descanse lo suficiente.   Aumente la ingesta de calcio en la dieta (1200mg/da).   Evite los alimentos que usted nota que puedan afectar al beb.  SOLICITE ATENCIN MDICA SI:  Tiene preguntas que formular o dificultades con la alimentacin a pecho.   Necesita ayuda.   Observa una zona dura, roja y que le duele en la zona de la mama, y se acompaa de fiebre de 100.5 F (38.1 C) o ms.   El beb est muy somnoliento como para alimentarse bien o tiene problemas para dormir.   El beb moja menos de 6 paales por da, a partir de los 5 das de vida.   La piel del beb o la parte blanca de sus ojos est ms amarilla de lo que estaba en el hospital.   Se siente deprimida.  Document Released: 10/19/2005 Document Revised: 10/08/2011 [employer] Patient Information 2012 [employer]. 

## 2012-01-28 NOTE — Progress Notes (Signed)
No vaginal discharge. Pulse 104. 1 hr gtt due at 10:53.

## 2012-02-04 ENCOUNTER — Other Ambulatory Visit: Payer: Self-pay | Admitting: Obstetrics & Gynecology

## 2012-02-04 ENCOUNTER — Ambulatory Visit (INDEPENDENT_AMBULATORY_CARE_PROVIDER_SITE_OTHER): Payer: Medicaid Other | Admitting: Obstetrics & Gynecology

## 2012-02-04 VITALS — BP 106/68 | Temp 98.2°F | Wt 147.2 lb

## 2012-02-04 DIAGNOSIS — O09529 Supervision of elderly multigravida, unspecified trimester: Secondary | ICD-10-CM

## 2012-02-04 DIAGNOSIS — O0990 Supervision of high risk pregnancy, unspecified, unspecified trimester: Secondary | ICD-10-CM

## 2012-02-04 DIAGNOSIS — O26879 Cervical shortening, unspecified trimester: Secondary | ICD-10-CM

## 2012-02-04 DIAGNOSIS — O30009 Twin pregnancy, unspecified number of placenta and unspecified number of amniotic sacs, unspecified trimester: Secondary | ICD-10-CM

## 2012-02-04 LAB — POCT URINALYSIS DIP (DEVICE)
Ketones, ur: NEGATIVE mg/dL
Leukocytes, UA: NEGATIVE
Protein, ur: NEGATIVE mg/dL
Urobilinogen, UA: 0.2 mg/dL (ref 0.0–1.0)
pH: 6 (ref 5.0–8.0)

## 2012-02-04 NOTE — Progress Notes (Signed)
U/S scheduled 02/10/12 at 230pm.

## 2012-02-04 NOTE — Progress Notes (Signed)
No complaints.  Signed tubal papers on 01/04/12. Needs growth scan next week. No other complaints or concerns.  Fetal movement and labor precautions reviewed.

## 2012-02-04 NOTE — Progress Notes (Signed)
Pulse 103 Pt has some abdominal pain, no pressure.

## 2012-02-04 NOTE — Patient Instructions (Signed)
Return to clinic for any obstetric concerns or go to [facility] for evaluation  

## 2012-02-10 ENCOUNTER — Other Ambulatory Visit: Payer: Self-pay | Admitting: Obstetrics & Gynecology

## 2012-02-10 ENCOUNTER — Ambulatory Visit (HOSPITAL_COMMUNITY)
Admission: RE | Admit: 2012-02-10 | Discharge: 2012-02-10 | Disposition: A | Discharge status: Home / Self Care | Payer: Medicaid Other | Source: Ambulatory Visit | Attending: Obstetrics & Gynecology | Admitting: Obstetrics & Gynecology

## 2012-02-10 DIAGNOSIS — O09529 Supervision of elderly multigravida, unspecified trimester: Secondary | ICD-10-CM

## 2012-02-10 DIAGNOSIS — O26879 Cervical shortening, unspecified trimester: Secondary | ICD-10-CM

## 2012-02-10 DIAGNOSIS — O30009 Twin pregnancy, unspecified number of placenta and unspecified number of amniotic sacs, unspecified trimester: Secondary | ICD-10-CM

## 2012-02-10 DIAGNOSIS — O09299 Supervision of pregnancy with other poor reproductive or obstetric history, unspecified trimester: Secondary | ICD-10-CM | POA: Insufficient documentation

## 2012-02-10 DIAGNOSIS — O3430 Maternal care for cervical incompetence, unspecified trimester: Secondary | ICD-10-CM | POA: Insufficient documentation

## 2012-02-11 ENCOUNTER — Encounter: Payer: Self-pay | Admitting: Obstetrics & Gynecology

## 2012-02-18 ENCOUNTER — Ambulatory Visit (INDEPENDENT_AMBULATORY_CARE_PROVIDER_SITE_OTHER): Payer: Medicaid Other | Admitting: Obstetrics and Gynecology

## 2012-02-18 VITALS — BP 98/63 | HR 91 | Temp 97.9°F | Wt 149.2 lb

## 2012-02-18 DIAGNOSIS — Z641 Problems related to multiparity: Secondary | ICD-10-CM

## 2012-02-18 DIAGNOSIS — Z23 Encounter for immunization: Secondary | ICD-10-CM

## 2012-02-18 DIAGNOSIS — O0990 Supervision of high risk pregnancy, unspecified, unspecified trimester: Secondary | ICD-10-CM

## 2012-02-18 DIAGNOSIS — O26879 Cervical shortening, unspecified trimester: Secondary | ICD-10-CM

## 2012-02-18 DIAGNOSIS — O30009 Twin pregnancy, unspecified number of placenta and unspecified number of amniotic sacs, unspecified trimester: Secondary | ICD-10-CM

## 2012-02-18 LAB — POCT URINALYSIS DIP (DEVICE)
Bilirubin Urine: NEGATIVE
Hgb urine dipstick: NEGATIVE
Leukocytes, UA: NEGATIVE
Nitrite: NEGATIVE
Protein, ur: NEGATIVE mg/dL
pH: 7 (ref 5.0–8.0)

## 2012-02-18 MED ORDER — TETANUS-DIPHTH-ACELL PERTUSSIS 5-2.5-18.5 LF-MCG/0.5 IM SUSP
0.5000 mL | Freq: Once | INTRAMUSCULAR | Status: AC
  Administered 2012-02-18: 0.5 mL via INTRAMUSCULAR

## 2012-02-18 MED ORDER — TETANUS-DIPHTH-ACELL PERTUSSIS 5-2.5-18.5 LF-MCG/0.5 IM SUSP: 0.5000 mL | Freq: Once | INTRAMUSCULAR | Status: DC

## 2012-02-18 NOTE — Progress Notes (Signed)
Pulse: 91. Pain on right side of abdomen since last night. No pressure. Vaginal d/c described as clear, watery; usually a day after administration of progesterone capsule.

## 2012-02-18 NOTE — Progress Notes (Signed)
Informal [place] for FHT's  Twin A= 146, breech on maternal Lt,  Twin B=133, breech on maternal Rt

## 2012-02-18 NOTE — Progress Notes (Signed)
Addended by: Lynnell Dike on: 02/18/2012 10:31 AM   Modules accepted: Orders

## 2012-02-18 NOTE — Progress Notes (Signed)
[place] 4/10: concordant growth, nl AFV, short cx stable. Denies UCs. Discussed RLP. Cont. Pelvic rest and Prometrium.  See SW re BTL.

## 2012-02-18 NOTE — Patient Instructions (Signed)
Dolor del ligamento redondo (Round Ligament Pain) El ligamento redondo se compone de msculo y tejido fibroso. Est unido al tero cerca de las trompas de [place] El ligamento redondo est ubicado en ambos lados del tero y ayuda a mantener su posicin. Normalmente comienza en el segundo trimestre del embarazo cuando el tero sale hacia afuera de la pelvis. El dolor puede aparecer y desaparecer hasta el nacimiento del beb. El dolor de ligamento redondo no es un problema serio y no ocasiona daos al beb. CAUSAS Durante el embarazo el tero crece mayormente desde el segundo trimestre hasta el parto. A medida que crece, se estira y tuerce ligeramente los ligamentos. Cuando el tero ejerce presin en ambos lados, el ligamento redondo del lado opuesto presiona y se estira. Esto causa dolor. SNTOMAS El dolor puede ocurrir en uno o ambos lados. El dolor es por lo general como un pellizco corto y filoso. A veces puede ser un dolor opaco y persistente. Se siente en la parte baja del abdomen o en la ingle. Es un dolor interno, y por lo general comienza en la ingle y se mueve hacia la zona de la cadera. El dolor puede ocurrir con:  Cambio de posicin repentino como el levantarse de la cama o de una silla.   Darse vuelta en la cama.   Toser o estornudar.   Caminar demasiado.   Cualquier tipo de actividad fsica.  DIAGNSTICO El medico deber asegurarse de que no existen problemas graves que ocasionan el dolor. Si no encuentra nada grave, los sntomas suelen indicar de que se trata de un dolor proveniente del ligamento redondo. TRATAMIENTO  Sintese y reljese cuando el dolor comience.   Llevar las rodillas hacia el estmago.   Recustese sobre un lado con una almohada debajo del vientre (abdomen) y otra entre sus piernas.   Sintese en un bao caliente de 15 a 20 minutos o hasta que el dolor desaparezca.  INSTRUCCIONES PARA EL CUIDADO DOMICILIARIO  Utilice los medicamentos de venta libre o de  prescripcin para el dolor, el malestar o la fiebre, segn se lo indique el profesional que lo asiste.   Sintese y pngase de pie lentamente.   Evite caminatas largas si le ocasionan dolor.   Detenga o disminuya las actividades fsicas si le ocasionan dolor.  SOLICITE ATENCIN MDICA SI:  El dolor no desaparece con estas medidas.   Necesita que le prescriban medicamentos ms fuertes para el dolor.   Desarrolla un dolor de espalda que no haba sentido antes junto con el de lado.  SOLICITE ATENCIN MDICA DE INMEDIATO SI:  La temperatura se eleva por encima de 102 F (38.9 C) o ms.   Siente contracciones uterinas.   Presenta hemorragia vaginal.   Presenta nuseas, diarrea o vmitos.   Comienza a sentir escalofros   Siente dolor al orinar.  Document Released: 10/01/2008 Document Revised: 10/08/2011 [employer] Patient Information 2012 [employer]. 

## 2012-02-25 ENCOUNTER — Ambulatory Visit (INDEPENDENT_AMBULATORY_CARE_PROVIDER_SITE_OTHER): Payer: Medicaid Other | Admitting: Obstetrics and Gynecology

## 2012-02-25 ENCOUNTER — Other Ambulatory Visit: Payer: Self-pay | Admitting: Obstetrics and Gynecology

## 2012-02-25 VITALS — BP 103/69 | Temp 97.0°F | Wt 152.5 lb

## 2012-02-25 DIAGNOSIS — Z641 Problems related to multiparity: Secondary | ICD-10-CM

## 2012-02-25 DIAGNOSIS — O30009 Twin pregnancy, unspecified number of placenta and unspecified number of amniotic sacs, unspecified trimester: Secondary | ICD-10-CM

## 2012-02-25 DIAGNOSIS — O26879 Cervical shortening, unspecified trimester: Secondary | ICD-10-CM

## 2012-02-25 DIAGNOSIS — O0990 Supervision of high risk pregnancy, unspecified, unspecified trimester: Secondary | ICD-10-CM

## 2012-02-25 DIAGNOSIS — O09529 Supervision of elderly multigravida, unspecified trimester: Secondary | ICD-10-CM

## 2012-02-25 DIAGNOSIS — O0930 Supervision of pregnancy with insufficient antenatal care, unspecified trimester: Secondary | ICD-10-CM

## 2012-02-25 LAB — POCT URINALYSIS DIP (DEVICE)
Ketones, ur: NEGATIVE mg/dL
Leukocytes, UA: NEGATIVE
Protein, ur: NEGATIVE mg/dL
Urobilinogen, UA: 0.2 mg/dL (ref 0.0–1.0)
pH: 7 (ref 5.0–8.0)

## 2012-02-25 MED ORDER — PRENATAL MULTIVITAMIN CH: 1.0000 | ORAL_TABLET | Freq: Every day | ORAL | Status: DC

## 2012-02-25 MED ORDER — PROGESTERONE MICRONIZED 200 MG PO CAPS: ORAL_CAPSULE | ORAL | Status: DC

## 2012-02-25 MED ORDER — NIFEDIPINE ER 30 MG PO TB24: 30.0000 mg | ORAL_TABLET | Freq: Two times a day (BID) | ORAL | Status: DC

## 2012-02-25 NOTE — Progress Notes (Signed)
Pulse 95. Pain on left hip. No pressure.

## 2012-02-25 NOTE — Progress Notes (Addendum)
U/S scheduled Mar 04, 2012 at 930 am.

## 2012-02-25 NOTE — Progress Notes (Signed)
Patient doing well. Reports contractions decreased with procardia. Will schedule F/U growth ultrasound. Will start fetal testing at 32 weeks

## 2012-03-03 ENCOUNTER — Ambulatory Visit (INDEPENDENT_AMBULATORY_CARE_PROVIDER_SITE_OTHER): Payer: Self-pay | Admitting: Family Medicine

## 2012-03-03 VITALS — BP 98/61 | Temp 97.0°F | Wt 154.1 lb

## 2012-03-03 DIAGNOSIS — O0990 Supervision of high risk pregnancy, unspecified, unspecified trimester: Secondary | ICD-10-CM

## 2012-03-03 DIAGNOSIS — O0930 Supervision of pregnancy with insufficient antenatal care, unspecified trimester: Secondary | ICD-10-CM

## 2012-03-03 DIAGNOSIS — O26879 Cervical shortening, unspecified trimester: Secondary | ICD-10-CM

## 2012-03-03 DIAGNOSIS — O30009 Twin pregnancy, unspecified number of placenta and unspecified number of amniotic sacs, unspecified trimester: Secondary | ICD-10-CM

## 2012-03-03 LAB — POCT URINALYSIS DIP (DEVICE)
Hgb urine dipstick: NEGATIVE
Nitrite: NEGATIVE
Protein, ur: NEGATIVE mg/dL
Specific Gravity, Urine: 1.015 (ref 1.005–1.030)
Urobilinogen, UA: 0.2 mg/dL (ref 0.0–1.0)

## 2012-03-03 MED ORDER — PROGESTERONE MICRONIZED 200 MG PO CAPS: ORAL_CAPSULE | ORAL | Status: DC

## 2012-03-03 MED ORDER — NIFEDIPINE ER 30 MG PO TB24: 30.0000 mg | ORAL_TABLET | Freq: Two times a day (BID) | ORAL | Status: DC

## 2012-03-03 NOTE — Progress Notes (Signed)
Addended by: Levie Heritage on: 03/03/2012 11:54 AM   Modules accepted: Orders, Level of Service

## 2012-03-03 NOTE — Progress Notes (Signed)
Pain in lower back that radiates to left groin, 10/10 in pain scale; pain in right leg. No c/o pressure.

## 2012-03-03 NOTE — Patient Instructions (Signed)
Embarazo - Tercer trimestre (Pregnancy - Third Trimester) El tercer trimestre del embarazo (los ltimos 3 meses) es el perodo de cambios ms rpidos que atraviesan usted y el beb. El aumento de peso es ms rpido. El beb alcanza un largo de aproximadamente 50 cm (20 pulgadas) y pesa entre 2,700 y 4,500 kg (6 a 10 libras). El beb gana ms tejido graso y ya est listo para la vida fuera del cuerpo de la madre. Mientras estn en el interior, los bebs tienen perodos de sueo y vigilia, succionan el pulgar y tienen hipo. Quizs sienta pequeas contracciones del tero. Este es el falso trabajo de parto. Tambin se las conoce como contracciones de Braxton-Hicks. Es como una prctica del parto. Los problemas ms habituales de esta etapa del embarazo incluyen mayor dificultad para respirar, hinchazn de las manos y los pies por retencin de lquidos y la necesidad de orinar con ms frecuencia debido a que el tero y el beb presionan sobre la vejiga.  EXAMENES PRENATALES  Durante los exmenes prenatales, deber seguir realizando pruebas de sangre, segn avance el embarazo. Estas pruebas se realizan para controlar su salud y la del beb. Tambin se realizan anlisis de sangre para conocer los niveles de hemoglobina. La anemia (bajo nivel de hemoglobina) es frecuente durante el embarazo. Para prevenirla, se administran hierro y vitaminas. Tambin le harn nuevas pruebas para descartar la diabetes. Podrn repetirle algunas de las pruebas que le hicieron previamente.   En cada visita le medirn el tamao del tero. Es para asegurarse de que el beb se desarrolla correctamente.   Tambin en cada visita la pesarn. Esto se realiza para asegurarse de que aumenta de peso al ritmo indicado y que usted y su beb evolucionan normalmente.   En algunas ocasiones se realiza una ecografa para confirmar el correcto desarrollo y evolucin del beb. Esta prueba se realiza con ondas sonoras inofensivas para el beb, de modo  que el profesional pueda calcular con ms precisin la fecha del parto.   Discuta las posibilidades de la anestesia si necesita cesrea.  Algunas veces se realizan pruebas especializadas del lquido amnitico que rodea al beb. Esta prueba se denomina amniocentesis. El lquido amnitico se obtiene introduciendo una aguja en el abdomen (vientre). En ocasiones se lleva a cabo cerca del final del embarazo, si es necesario adelantar el parto. En este caso se realiza para asegurarse de que los pulmones del beb estn lo suficientemente maduros como para que pueda vivir fuera del tero. CAMBIOS QUE OCURREN EN EL TERCER TRIMESTRE DEL EMBARAZO Su organismo atravesar diferentes cambios durante el embarazo que varan de una persona a otra. Converse con el profesional que la asiste acerca los cambios que usted note y que la preocupen.  Durante el ltimo trimestre probablemente sienta un aumento del apetito. Es normal tener "antojos" de ciertas comidas. Esto vara de una persona a otra y de un embarazo a otro.   Podrn aparecer las primeras estras en las caderas, abdomen y mamas. Estos son cambios normales del cuerpo durante el embarazo. No existen medicamentos ni ejercicios que puedan prevenir estos cambios.   El estreimiento puede tratarse con un laxante o agregando fibra a su dieta. Beber grandes cantidades de lquidos, tomar fibras en forma de verduras, frutas y granos integrales es de gran ayuda.   Tambin es beneficioso practicar actividad fsica. Si ha sido una persona activa hasta el embarazo, podr continuar con la mayora de las actividades durante el mismo. Si ha sido menos activa, puede ser beneficioso   que comience con un programa de ejercicios, como realizar caminatas. Consulte con el profesional que la asiste antes de comenzar un programa de ejercicios.   Evite el consumo de cigarrillos, el alcohol, los medicamentos no prescritos y las "drogas de la calle" durante el embarazo. Estas sustancias  qumicas afectan la formacin y el desarrollo del beb. Evite estas sustancias durante todo el embarazo para asegurar el nacimiento de un beb sano.   Dolor de espalda, venas varicosas y hemorroides podran aparecer o empeorar.   Los movimientos del beb pueden ser ms bruscos y aparecer ms a menudo.   Puede que note dificultades para respirar facilmente.   El ombligo podra salrsele hacia afuera.   Puede segregar un lquido amarillento (calostro) de las mamas.   Puede segregar mucus con sangre. Esto normalmente ocurre unos pocos das a una semana antes de que comience el trabajo de parto.  INSTRUCCIONES PARA EL CUIDADO DOMICILIARIO  La mayor parte de los cuidados que se aconsejan son los mismos que los indicados para las primeras etapas del embarazo. Es importante que concurra a todas las citas con el profesional y siga sus instrucciones con respecto a los medicamentos que deba utilizar, a la actividad fsica y a la dieta.   Durante el embarazo debe obtener nutrientes para usted y para su beb. Consuma alimentos balanceados a intervalos regulares. Elija alimentos como carne, pescado, leche y otros productos lcteos descremados, verduras, frutas, panes integrales y cereales. El profesional le informar cul es el aumento de peso ideal.   Las relaciones sexuales pueden continuarse hasta casi el final del embarazo, si no se presentan otros problemas como prdida prematura (antes de tiempo) de lquido amnitico, hemorragia vaginal o dolor abdominal (en el vientre).   Realice actividad fsica todos los das, si no tiene restricciones. Consulte con el profesional que la asiste si no sabe con certeza si determinados ejercicios son seguros. El mayor aumento de peso se produce en los dos ltimos trimestres del embarazo.   Haga reposo con frecuencia, con las piernas elevadas, o segn lo necesite para evitar los calambres y el dolor de cintura.   Use un buen sostn o como los que se usan para hacer  deportes para aliviar la sensibilidad de las mamas. Tambin puede serle til si lo usa mientras duerme. Si pierde calostro, podr utilizar apsitos en el sostn.   No utilice la baera con agua caliente, baos turcos y saunas.   Colquese el cinturn de seguridad cuando conduzca. Este la proteger a usted y al beb en caso de accidente.   Evite comer carne cruda y el contacto con los utensilios y desperdicios de los gatos. Estos elementos contienen grmenes que pueden causar defectos de nacimiento en el beb.   Es fcil perder algo de orina durante el embarazo. Apretar y fortalecer los msculos de la pelvis la ayudar con este problema. Practique detener la miccin cuando est en el bao. Estos son los mismos msculos que necesita fortalecer. Son tambin los mismos msculos que utiliza cuando trata de evitar los gases. Puede practicar apretando estos msculos diez veces, y repetir esto tres veces por da aproximadamente. Una vez que conozca qu msculos debe contraer, no realice estos ejercicios durante la miccin. Puede favorecerle una infeccin si la orina vuelve hacia atrs.   Pida ayuda si tiene necesidades econmicas, de asesoramiento o nutricionales durante el embarazo. El profesional podr ayudarla con respecto a estas necesidades, o derivarla a otros especialistas.   Practique la ida hasta el hospital a modo   de prueba.   Tome clases prenatales junto con su pareja para comprender, practicar y hacer preguntas acerca del trabajo de parto y el nacimiento.   Prepare la habitacin del beb.   No viaje fuera de la ciudad a menos que sea absolutamente necesario y con el consejo del mdico.   Use slo zapatos bajos sin taco para tener un mejor equilibrio y prevenir cadas.  EL CONSUMO DE MEDICAMENTOS Y DROGAS DURANTE EL EMBARAZO  Contine tomando las vitaminas apropiadas para esta etapa tal como se le indic. Las vitaminas deben contener un miligramo de cido flico y deben suplementarse con  hierro. Guarde todas las vitaminas fuera del alcance de los nios. La ingestin de slo un par de vitaminas o comprimidos que contengan hierro pueden ocasionar la muerte en un beb o en un nio pequeo.   Evite el uso de medicamentos, inclusive los de venta libre, que no hayan sido prescritos o indicados por el profesional que la asiste. Algunos medicamentos pueden causar problemas fsicos al beb. Utilice los medicamentos de venta libre o de prescripcin para el dolor, el malestar o la fiebre, segn se lo indique el profesional que lo asiste. No utilice aspirina, ibuprofeno (Motrin, Advil, Nuprin) o naproxeno (Aleve) a menos que el profesional la autorice.   El alcohol se asocia a cierto nmero de defectos del nacimiento, incluido el sndrome de alcoholismo fetal. Debe evitar el consumo de alcohol en cualquiera de sus formas. El cigarrillo causa nacimientos prematuros y bebs de bajo peso al nacer. Las drogas de la calle son muy nocivas para el beb y estn absolutamente prohibidas. Un beb que nace de una madre adicta, ser adicto al nacer. Ese beb tendr los mismos sntomas de abstinencia que un adulto.   Infrmele al profesional si consume alguna droga.  SOLICITE ATENCIN MDICA SI: Tiene alguna preocupacin durante el embarazo. Es mejor que llame para formular las preguntas si no puede esperar hasta la prxima visita, que sentirse preocupada por ellas.  DECISIONES ACERCA DE LA CIRCUNCISIN Usted puede saber o no cul es el sexo de su beb. Si es un varn, ste es el momento de pensar acerca de la circuncisin. La circuncisin es la extirpacin del prepucio. Esta es la piel que cubre el extremo sensible del pene. No hay un motivo mdico que lo justifique. Generalmente la decisin se toma segn lo que sea popular en ese momento, o se basa en creencias religiosas. Podr conversar estos temas con el profesional que la asiste. SOLICITE ATENCIN MDICA DE INMEDIATO SI:  La temperatura oral se eleva  sin motivo por encima de 102 F (38.9 C) o segn le indique el profesional que la asiste.   Tiene una prdida de lquido por la vagina (canal de parto). Si sospecha una ruptura de las membranas, tmese la temperatura y llame al profesional para informarlo sobre esto.   Observa unas pequeas manchas, una hemorragia vaginal o elimina cogulos. Avsele al profesional acerca de la cantidad y de cuntos apsitos est utilizando.   Presenta un olor desagradable en la secrecin vaginal y observa un cambio en el color, de transparente a blanco.   Ha vomitado durante ms de 24 horas.   Presenta escalofros o fiebre.   Comienza a sentir falta de aire.   Siente ardor al orinar.   Baja o sube ms de 900 g (ms de 2 libras), o segn lo indicado por el profesional que la asiste. Observa que sbitamente se le hinchan el rostro, las manos, los pies o las   piernas.   Presenta dolor abdominal. Las molestias en el ligamento redondo son una causa benigna (no cancerosa) frecuente de dolor abdominal durante el embarazo, pero el profesional que la asiste deber evaluarlo.   Presenta dolor de cabeza intenso que no se alivia.   Si no siente los movimientos del beb durante ms de tres horas. Si piensa que el beb no se mueve tanto como lo haca habitualmente, coma algo que contenga azcar y recustese sobre el lado izquierdo durante una hora. El beb debe moverse al menos 4  5 veces por hora. Comunquese inmediatamente si el beb se mueve menos que lo indicado.   Se cae, se ve involucrada en un accidente automovilstico o sufre algn tipo de traumatismo.   En su hogar hay violencia mental o fsica.  Document Released: 07/29/2005 Document Revised: 10/08/2011 [employer] Patient Information 2012 [employer]. 

## 2012-03-03 NOTE — Progress Notes (Signed)
Has some intermittent lower back and leg pain due to pregnancy.  No other complaints.  Denies contractions, vaginal bleeding, vaginal discharge.

## 2012-03-04 ENCOUNTER — Ambulatory Visit (HOSPITAL_COMMUNITY)
Admission: RE | Admit: 2012-03-04 | Discharge: 2012-03-04 | Disposition: A | Discharge status: Home / Self Care | Payer: Medicaid Other | Source: Ambulatory Visit | Attending: Obstetrics and Gynecology | Admitting: Obstetrics and Gynecology

## 2012-03-04 DIAGNOSIS — O3430 Maternal care for cervical incompetence, unspecified trimester: Secondary | ICD-10-CM | POA: Insufficient documentation

## 2012-03-04 DIAGNOSIS — O09299 Supervision of pregnancy with other poor reproductive or obstetric history, unspecified trimester: Secondary | ICD-10-CM | POA: Insufficient documentation

## 2012-03-04 DIAGNOSIS — O09529 Supervision of elderly multigravida, unspecified trimester: Secondary | ICD-10-CM | POA: Insufficient documentation

## 2012-03-04 DIAGNOSIS — O30009 Twin pregnancy, unspecified number of placenta and unspecified number of amniotic sacs, unspecified trimester: Secondary | ICD-10-CM | POA: Insufficient documentation

## 2012-03-04 DIAGNOSIS — O0990 Supervision of high risk pregnancy, unspecified, unspecified trimester: Secondary | ICD-10-CM

## 2012-03-17 ENCOUNTER — Ambulatory Visit (INDEPENDENT_AMBULATORY_CARE_PROVIDER_SITE_OTHER): Payer: Medicaid Other | Admitting: Obstetrics & Gynecology

## 2012-03-17 VITALS — BP 105/68 | Temp 98.3°F | Wt 156.5 lb

## 2012-03-17 DIAGNOSIS — O0930 Supervision of pregnancy with insufficient antenatal care, unspecified trimester: Secondary | ICD-10-CM

## 2012-03-17 LAB — POCT URINALYSIS DIP (DEVICE)
Bilirubin Urine: NEGATIVE
Hgb urine dipstick: NEGATIVE
Ketones, ur: NEGATIVE mg/dL
Nitrite: NEGATIVE
Specific Gravity, Urine: 1.02 (ref 1.005–1.030)
pH: 7 (ref 5.0–8.0)

## 2012-03-17 NOTE — Progress Notes (Signed)
Pulse: 89

## 2012-03-17 NOTE — Progress Notes (Signed)
Informal scan for position and FHT's.    Twin A= vtx, FHR = 128,  Twin B = breech, FHR = 136

## 2012-03-17 NOTE — Progress Notes (Signed)
Last growth [place] -- concordant growth.  Needs [place] to check FH x2.  Continue prometrium and procardia until next week.

## 2012-03-31 ENCOUNTER — Encounter: Payer: Self-pay | Admitting: Family Medicine

## 2012-03-31 ENCOUNTER — Ambulatory Visit (INDEPENDENT_AMBULATORY_CARE_PROVIDER_SITE_OTHER): Payer: Medicaid Other | Admitting: Family Medicine

## 2012-03-31 VITALS — BP 106/63 | Temp 97.8°F | Wt 161.4 lb

## 2012-03-31 DIAGNOSIS — O30009 Twin pregnancy, unspecified number of placenta and unspecified number of amniotic sacs, unspecified trimester: Secondary | ICD-10-CM

## 2012-03-31 DIAGNOSIS — O0930 Supervision of pregnancy with insufficient antenatal care, unspecified trimester: Secondary | ICD-10-CM

## 2012-03-31 DIAGNOSIS — O09529 Supervision of elderly multigravida, unspecified trimester: Secondary | ICD-10-CM

## 2012-03-31 DIAGNOSIS — O26879 Cervical shortening, unspecified trimester: Secondary | ICD-10-CM

## 2012-03-31 LAB — POCT URINALYSIS DIP (DEVICE)
Bilirubin Urine: NEGATIVE
Glucose, UA: NEGATIVE mg/dL
Ketones, ur: NEGATIVE mg/dL
Specific Gravity, Urine: 1.01 (ref 1.005–1.030)

## 2012-03-31 LAB — OB RESULTS CONSOLE GBS: GBS: NEGATIVE

## 2012-03-31 NOTE — Patient Instructions (Signed)
Monitoreo fetal, Prueba sin estrs (Psychologist, counselling, Nonstress Test) El beb (feto) es monitoreado antes del nacimiento. Esto se realiza para observar si existe algn problema. La correccin de los problemas puede prevenir la muerte fetal. Los patrones de monitoreo de la frecuencia cardaca del feto se ha utilizado durante casi treinta aos. actualmente, la ultrasonografa y la velocimetra Doppler se utilizan para Scientist, physiological salud del beb. Tambin se realizan diferentes monitoreos si usted tiene problemas de Visual merchandiser. Tambin se utilizan si el beb no crece normalmente.  TCNICAS DE MONITOREO DEL BEB ANTES DEL NACIMIENTO Se utilizan varios tipos de prueba. Entre ellas se incluyen la evaluacin de los movimientos fetales, prueba sin estrs, prueba de estrs durante la contraccin, el perfil biofsico fetal, el perfil biofsico modificado y velocimetra Doppler arterial umbilical. Los nombres parecen difciles pero las pruebas se realizan de rutina. PRUEBA SIN ESTRS La prueba sin estrs se basa en la idea de que la frecuencia cardaca de un bebe normal se acelerar si el beb se mueve. Esto es indicador de un embarazo que sigue su curso normal. La prdida del movimiento generalmente se observa cuando el beb duerme, o si existen problemas en el embarazo. La frecuencia cardaca fetal se controla con un electrodo en el abdomen de la [place], [place] se encuentra recostada sobre un lado. El fumar puede [employer] de la prueba. Se observa un trazado de aceleraciones de frecuencia cardaca fetal que aumentan en al menos 15 latidos por minuto con respecto al estado de reposo y duran al menos 15 segundos. Puede ser necesario realizar un trazado de 40 minutos o ms. La estimulacin sonora de un feto sano puede acelerar la frecuencia cardaca del beb. Esto tambin significa que el beb es sano. La estimulacin ayuda a reducir [employer] de la prueba y adems puede descubrir problemas del  Psychiatrist. Para hacerlo, se coloca sobre el abdomen de la madre un dispositivo para estimulacin sonora durante aproximadamente 12 segundos. Esto puede repetirse hasta tres veces para obtener una duracin cada vez mayor.  Los resultados de la prueba sin estrs se clasifican en reactivos (normal) o no reactivos (anormal). Generalmente se considera reactivo (normal) si hay dos o ms aceleraciones en la frecuencia cardaca fetal como se han descrito, dentro de un perodo de 20 minutos. Pueden ser con o sin los movimientos fetales que la madre percibe. Una prueba no reactiva es la que no presenta suficientes aceleraciones de la frecuencia cardaca en un perodo de 40 minutos. Una prueba anormal puede requerir un seguimiento ms profundo. El profesional que la asiste le ayudar a [employer], y lo que significan para usted y su beb. Document Released: 10/19/2005 Document Revised: 10/08/2011 [facility] Patient Information 2012 [place], [place].

## 2012-03-31 NOTE — Progress Notes (Signed)
No complaints.  Denies regular contractions, vaginal discharge, vaginal bleeding.  [place] next week for growth.  Twice weekly testing starting next week.

## 2012-03-31 NOTE — Progress Notes (Signed)
Pulse- 76  Edema-feet  Pain-vaginal/stomach x 3 days Interpreter # [phone]

## 2012-04-01 LAB — GC/CHLAMYDIA PROBE AMP, GENITAL: GC Probe Amp, Genital: NEGATIVE

## 2012-04-07 ENCOUNTER — Ambulatory Visit (HOSPITAL_COMMUNITY)
Admission: RE | Admit: 2012-04-07 | Discharge: 2012-04-07 | Disposition: A | Discharge status: Home / Self Care | Payer: Medicaid Other | Source: Ambulatory Visit | Attending: Family Medicine | Admitting: Family Medicine

## 2012-04-07 DIAGNOSIS — O3430 Maternal care for cervical incompetence, unspecified trimester: Secondary | ICD-10-CM | POA: Insufficient documentation

## 2012-04-07 DIAGNOSIS — O09299 Supervision of pregnancy with other poor reproductive or obstetric history, unspecified trimester: Secondary | ICD-10-CM | POA: Insufficient documentation

## 2012-04-07 DIAGNOSIS — O30009 Twin pregnancy, unspecified number of placenta and unspecified number of amniotic sacs, unspecified trimester: Secondary | ICD-10-CM | POA: Insufficient documentation

## 2012-04-07 DIAGNOSIS — O09529 Supervision of elderly multigravida, unspecified trimester: Secondary | ICD-10-CM | POA: Insufficient documentation

## 2012-04-19 ENCOUNTER — Inpatient Hospital Stay (HOSPITAL_COMMUNITY): Payer: Medicaid Other | Admitting: Anesthesiology

## 2012-04-19 ENCOUNTER — Encounter (HOSPITAL_COMMUNITY): Payer: Self-pay | Admitting: *Deleted

## 2012-04-19 ENCOUNTER — Inpatient Hospital Stay (HOSPITAL_COMMUNITY)
Admission: AD | Admit: 2012-04-19 | Discharge: 2012-04-21 | DRG: 775 | Disposition: A | Discharge status: Home / Self Care | Payer: Medicaid Other | Source: Ambulatory Visit | Attending: Obstetrics & Gynecology | Admitting: Obstetrics & Gynecology

## 2012-04-19 ENCOUNTER — Encounter (HOSPITAL_COMMUNITY): Payer: Self-pay | Admitting: Anesthesiology

## 2012-04-19 DIAGNOSIS — O26879 Cervical shortening, unspecified trimester: Secondary | ICD-10-CM

## 2012-04-19 DIAGNOSIS — O30009 Twin pregnancy, unspecified number of placenta and unspecified number of amniotic sacs, unspecified trimester: Secondary | ICD-10-CM

## 2012-04-19 DIAGNOSIS — O3090 Multiple gestation, unspecified, unspecified trimester: Secondary | ICD-10-CM

## 2012-04-19 DIAGNOSIS — O09529 Supervision of elderly multigravida, unspecified trimester: Secondary | ICD-10-CM

## 2012-04-19 DIAGNOSIS — O329XX0 Maternal care for malpresentation of fetus, unspecified, not applicable or unspecified: Principal | ICD-10-CM | POA: Diagnosis present

## 2012-04-19 LAB — PREPARE RBC (CROSSMATCH)

## 2012-04-19 LAB — CBC
HCT: 34 % — ABNORMAL LOW (ref 36.0–46.0)
MCHC: 33.2 g/dL (ref 30.0–36.0)
Platelets: 141 10*3/uL — ABNORMAL LOW (ref 150–400)
RDW: 15 % (ref 11.5–15.5)
WBC: 9.2 10*3/uL (ref 4.0–10.5)

## 2012-04-19 LAB — HEPATITIS B SURFACE ANTIGEN: Hepatitis B Surface Ag: NEGATIVE

## 2012-04-19 LAB — APTT: aPTT: 29 seconds (ref 24–37)

## 2012-04-19 LAB — RPR: RPR Ser Ql: NONREACTIVE

## 2012-04-19 MED ORDER — OXYCODONE-ACETAMINOPHEN 5-325 MG PO TABS: 1.0000 | ORAL_TABLET | ORAL | Status: DC | PRN

## 2012-04-19 MED ORDER — OXYTOCIN 40 UNITS IN LACTATED RINGERS INFUSION - SIMPLE MED
62.5000 mL/h | Freq: Once | INTRAVENOUS | Status: AC
  Administered 2012-04-19: 2.5 [IU]/h via INTRAVENOUS
  Filled 2012-04-19: qty 1000

## 2012-04-19 MED ORDER — CITRIC ACID-SODIUM CITRATE 334-500 MG/5ML PO SOLN
ORAL | Status: AC
  Filled 2012-04-19: qty 15

## 2012-04-19 MED ORDER — FLEET ENEMA 7-19 GM/118ML RE ENEM: 1.0000 | ENEMA | RECTAL | Status: DC | PRN

## 2012-04-19 MED ORDER — ONDANSETRON HCL 4 MG/2ML IJ SOLN: 4.0000 mg | Freq: Four times a day (QID) | INTRAMUSCULAR | Status: DC | PRN

## 2012-04-19 MED ORDER — ONDANSETRON HCL 4 MG PO TABS: 4.0000 mg | ORAL_TABLET | ORAL | Status: DC | PRN

## 2012-04-19 MED ORDER — PHENYLEPHRINE 40 MCG/ML (10ML) SYRINGE FOR IV PUSH (FOR BLOOD PRESSURE SUPPORT): 80.0000 ug | PREFILLED_SYRINGE | INTRAVENOUS | Status: DC | PRN

## 2012-04-19 MED ORDER — IBUPROFEN 600 MG PO TABS: 600.0000 mg | ORAL_TABLET | Freq: Four times a day (QID) | ORAL | Status: DC | PRN

## 2012-04-19 MED ORDER — DIPHENHYDRAMINE HCL 50 MG/ML IJ SOLN: 12.5000 mg | INTRAMUSCULAR | Status: DC | PRN

## 2012-04-19 MED ORDER — PRENATAL MULTIVITAMIN CH
1.0000 | ORAL_TABLET | Freq: Every day | ORAL | Status: DC
  Administered 2012-04-20 – 2012-04-21 (×2): 1 via ORAL
  Filled 2012-04-19 (×2): qty 1

## 2012-04-19 MED ORDER — PHENYLEPHRINE 40 MCG/ML (10ML) SYRINGE FOR IV PUSH (FOR BLOOD PRESSURE SUPPORT)
80.0000 ug | PREFILLED_SYRINGE | INTRAVENOUS | Status: DC | PRN
  Filled 2012-04-19: qty 5

## 2012-04-19 MED ORDER — SENNOSIDES-DOCUSATE SODIUM 8.6-50 MG PO TABS
2.0000 | ORAL_TABLET | Freq: Every day | ORAL | Status: DC
  Administered 2012-04-20: 2 via ORAL

## 2012-04-19 MED ORDER — CITRIC ACID-SODIUM CITRATE 334-500 MG/5ML PO SOLN: 30.0000 mL | ORAL | Status: DC | PRN

## 2012-04-19 MED ORDER — EPHEDRINE 5 MG/ML INJ: 10.0000 mg | INTRAVENOUS | Status: DC | PRN

## 2012-04-19 MED ORDER — IBUPROFEN 600 MG PO TABS
600.0000 mg | ORAL_TABLET | Freq: Four times a day (QID) | ORAL | Status: DC
  Administered 2012-04-20 – 2012-04-21 (×6): 600 mg via ORAL
  Filled 2012-04-19 (×7): qty 1

## 2012-04-19 MED ORDER — EPHEDRINE 5 MG/ML INJ
10.0000 mg | INTRAVENOUS | Status: DC | PRN
  Filled 2012-04-19: qty 4

## 2012-04-19 MED ORDER — TETANUS-DIPHTH-ACELL PERTUSSIS 5-2.5-18.5 LF-MCG/0.5 IM SUSP: 0.5000 mL | Freq: Once | INTRAMUSCULAR | Status: DC

## 2012-04-19 MED ORDER — LACTATED RINGERS IV SOLN
500.0000 mL | Freq: Once | INTRAVENOUS | Status: AC
  Administered 2012-04-19: 12:00:00 via INTRAVENOUS

## 2012-04-19 MED ORDER — ZOLPIDEM TARTRATE 5 MG PO TABS: 5.0000 mg | ORAL_TABLET | Freq: Every evening | ORAL | Status: DC | PRN

## 2012-04-19 MED ORDER — LACTATED RINGERS IV SOLN
INTRAVENOUS | Status: DC
  Administered 2012-04-19: 125 mL/h via INTRAVENOUS
  Administered 2012-04-19 (×2): via INTRAVENOUS

## 2012-04-19 MED ORDER — LIDOCAINE HCL (PF) 1 % IJ SOLN
INTRAMUSCULAR | Status: DC | PRN
  Administered 2012-04-19 (×2): 5 mL

## 2012-04-19 MED ORDER — OXYCODONE-ACETAMINOPHEN 5-325 MG PO TABS
1.0000 | ORAL_TABLET | ORAL | Status: DC | PRN
  Administered 2012-04-21: 1 via ORAL
  Filled 2012-04-19: qty 1

## 2012-04-19 MED ORDER — ONDANSETRON HCL 4 MG/2ML IJ SOLN: 4.0000 mg | INTRAMUSCULAR | Status: DC | PRN

## 2012-04-19 MED ORDER — BENZOCAINE-MENTHOL 20-0.5 % EX AERO: 1.0000 "application " | INHALATION_SPRAY | CUTANEOUS | Status: DC | PRN

## 2012-04-19 MED ORDER — LACTATED RINGERS IV SOLN: 500.0000 mL | INTRAVENOUS | Status: DC | PRN

## 2012-04-19 MED ORDER — ACETAMINOPHEN 325 MG PO TABS: 650.0000 mg | ORAL_TABLET | ORAL | Status: DC | PRN

## 2012-04-19 MED ORDER — OXYTOCIN 40 UNITS IN LACTATED RINGERS INFUSION - SIMPLE MED
1.0000 m[IU]/min | INTRAVENOUS | Status: DC
  Administered 2012-04-19: 2 m[IU]/min via INTRAVENOUS

## 2012-04-19 MED ORDER — FENTANYL CITRATE 0.05 MG/ML IJ SOLN: 50.0000 ug | INTRAMUSCULAR | Status: DC | PRN

## 2012-04-19 MED ORDER — WITCH HAZEL-GLYCERIN EX PADS: 1.0000 "application " | MEDICATED_PAD | CUTANEOUS | Status: DC | PRN

## 2012-04-19 MED ORDER — MISOPROSTOL 200 MCG PO TABS
ORAL_TABLET | ORAL | Status: AC
  Administered 2012-04-19: 1000 ug
  Filled 2012-04-19: qty 5

## 2012-04-19 MED ORDER — OXYTOCIN BOLUS FROM INFUSION
250.0000 mL | Freq: Once | INTRAVENOUS | Status: DC
  Filled 2012-04-19: qty 500

## 2012-04-19 MED ORDER — SIMETHICONE 80 MG PO CHEW: 80.0000 mg | CHEWABLE_TABLET | ORAL | Status: DC | PRN

## 2012-04-19 MED ORDER — DIPHENHYDRAMINE HCL 25 MG PO CAPS: 25.0000 mg | ORAL_CAPSULE | Freq: Four times a day (QID) | ORAL | Status: DC | PRN

## 2012-04-19 MED ORDER — DIBUCAINE 1 % RE OINT: 1.0000 "application " | TOPICAL_OINTMENT | RECTAL | Status: DC | PRN

## 2012-04-19 MED ORDER — LANOLIN HYDROUS EX OINT: TOPICAL_OINTMENT | CUTANEOUS | Status: DC | PRN

## 2012-04-19 MED ORDER — TERBUTALINE SULFATE 1 MG/ML IJ SOLN: 0.2500 mg | Freq: Once | INTRAMUSCULAR | Status: DC | PRN

## 2012-04-19 MED ORDER — LIDOCAINE HCL (PF) 1 % IJ SOLN: 30.0000 mL | INTRAMUSCULAR | Status: DC | PRN

## 2012-04-19 MED ORDER — FENTANYL 2.5 MCG/ML BUPIVACAINE 1/10 % EPIDURAL INFUSION (WH - ANES)
14.0000 mL/h | INTRAMUSCULAR | Status: DC
  Administered 2012-04-19 (×2): 14 mL/h via EPIDURAL
  Filled 2012-04-19 (×2): qty 60

## 2012-04-19 NOTE — Progress Notes (Signed)
Patient ID: Carrie Buchanan, female   DOB: Dec 15, 1975, 36 y.o.   MRN: [id number] Asked to see pt. For pp bleeding.  Nurse noted 20cc clot on pad. On exam, 250 cc removed from boggy uterus. of cytotec given per rectum. Excellent hemostasis noted.

## 2012-04-19 NOTE — Progress Notes (Signed)
Carrie Buchanan is a 36 y.o. [id number] at [redacted]w[redacted]d by ultrasound admitted for PROM  Subjective:   Objective: BP 112/77  Pulse 66  Temp 98.4 F (36.9 C) (Oral)  Resp 16  Ht 4\' 11"  (1.499 m)  Wt 171 lb (77.565 kg)  BMI 34.54 kg/m2  SpO2 99%      FHT:  FHR: 120 bpm, variability: moderate,  accelerations:  Present,  decelerations:  Absent B:  FHR:125, moderate variability, accelerations: Present, decelerations: Occ. Mild variable. UC:   regular, every 1-2 minutes SVE:   4/100/-2 Labs: Lab Results  Component Value Date   WBC 9.2 04/19/2012   HGB 11.3* 04/19/2012   HCT 34.0* 04/19/2012   MCV 94.4 04/19/2012   PLT 141* 04/19/2012    Assessment / Plan: Augmentation of labor, progressing well  Labor: Progressing normally Preeclampsia:  N/A Fetal Wellbeing:  Category I Pain Control:  Epidural I/D:  n/a Anticipated MOD:  NSVD  Jestina Stephani S 04/19/2012, 3:23 PM

## 2012-04-19 NOTE — H&P (Signed)
I saw this patient with the resident, reviewed the tracing, used ultrasound to determine presentation.  I have reviewed her history and performed an exam.  I agree with the resident's above note.  The patient is a grand multipara with a twin gestation, at term that are vtx, breech.  Discussed delivery and pain management with the pt.  Will give her 6 hours to spontaneously labor, then start pitocin if no labor.

## 2012-04-19 NOTE — Progress Notes (Signed)
[facility] Department of Clinical Social Work Documentation of Interpretation   I assisted __Dr. Freeman-Jackson_________________ with interpretation of _epidural_____________________ for this patient.

## 2012-04-19 NOTE — H&P (Signed)
Carrie Buchanan is a 36 y.o. female presenting for ROM. Maternal Medical History:  Reason for admission: Reason for admission: rupture of membranes.  Reason for Admission:   nausea@0600   Contractions: Frequency: irregular.   Perceived severity is moderate.    Fetal activity: Perceived fetal activity is normal.    Prenatal complications: Preterm labor.   No bleeding, cholelithiasis, HIV, hypertension, infection, IUGR, nephrolithiasis, oligohydramnios, placental abnormality, polyhydramnios, pre-eclampsia or substance abuse.   Hospitalization for short cervix.  Pt received betamethasone, magnesium for CP prophylaxis.  Pt adhered to strict bed rest    AMA Di/Di Twins, short cervix Cystic fibrosis screen negative 12/14/2011 Too late for integrated screen Quad screen drawn 12/14/2011 negative Early 1 hour GTT 96, repeat 117 Urine culture no growth Pap Smear negative on 12/14/2011 Plans to breast feed, wants BTL for contraception, papers signed 01/04/12  OB History    Grav Para Term Preterm Abortions TAB SAB Ect Mult Living   7 5 5  1  1   5      History reviewed. No pertinent past medical history. History reviewed. No pertinent past surgical history. Family History: family history includes Asthma in her mother. Social History:  reports that she has never smoked. She does not have any smokeless tobacco history on file. She reports that she does not drink alcohol or use illicit drugs.  Prenatal Transfer Tool  Maternal Diabetes: No Genetic Screening: Normal Maternal Ultrasounds/Referrals: Normal Fetal Ultrasounds or other Referrals:  None Maternal Substance Abuse:  No Significant Maternal Medications:  None Significant Maternal Lab Results:  None Other Comments:  Twin A vertex; Twin B breech  Review of Systems  Constitutional: Negative for fever and chills.  Eyes: Negative for blurred vision and double vision.  Respiratory: Negative for cough, hemoptysis, shortness of breath  and wheezing.   Cardiovascular: Positive for leg swelling. Negative for claudication.  Gastrointestinal: Negative for nausea, vomiting and abdominal pain.  Genitourinary: Negative.  Negative for dysuria, urgency and frequency.  Musculoskeletal: Negative.   Neurological: Negative.  Negative for headaches.  Endo/Heme/Allergies: Negative.   Psychiatric/Behavioral: Negative.     Dilation: 1.5 Effacement (%): 80 Station: -2 Exam by:: Dr. Berline Chough Blood pressure 137/79, pulse 62, temperature 97.8 F (36.6 C), temperature source Oral, resp. rate 18, height 4\' 11"  (1.499 m), weight 77.565 kg (171 lb). Maternal Exam:  Uterine Assessment: Contraction strength is moderate.  Contraction frequency is irregular.   Abdomen: Fetal presentation: vertex Bed side ultrasound confirmed twin in Vertex, Twin B breech   Introitus: Normal vulva. Normal vagina.  Ferning test: positive.   Pelvis: adequate for delivery.   Cervix: Cervix evaluated by digital exam.     Fetal Exam Fetal Monitor Review: Mode: ultrasound.   Baseline rate: Baby A - 125  Baby B - 130.  Pattern: no decelerations and accelerations present.    Fetal State Assessment: Category I - tracings are normal.     Physical Exam  Constitutional: She appears well-developed and well-nourished. No distress.  HENT:  Head: Normocephalic and atraumatic.  Mouth/Throat: No oropharyngeal exudate.  Eyes: Conjunctivae are normal. Right eye exhibits no discharge. Left eye exhibits no discharge.  Cardiovascular: Normal rate, regular rhythm, normal heart sounds and intact distal pulses.  Exam reveals no gallop and no friction rub.   No murmur heard. Respiratory: Breath sounds normal. No respiratory distress. She has no wheezes.  GI: Soft. Bowel sounds are normal. She exhibits distension and mass. There is no tenderness. There is no rebound and no guarding.  Genitourinary: Vagina normal and uterus normal.  Musculoskeletal: She exhibits edema (2+/4).  She exhibits no tenderness.  Skin: Skin is warm and dry. No rash noted. She is not diaphoretic. No erythema. No pallor.  Psychiatric: She has a normal mood and affect. Her behavior is normal. Judgment and thought content normal.    Prenatal labs: ABO, Rh: --/--/O POS (03/20 1130) Antibody: NEG (03/20 1130) Rubella: Immune (02/11 0000) RPR: NON REAC (03/28 1050)  HBsAg:   PENDING HIV: Non-reactive (02/11 0000)  GBS: Negative (05/30 0000)   Assessment/Plan: To L&D for expectant NSVD delivery. Dr. Shawnie Pons aware of pt and in to evaluate Ultrasound reveals twin A vertex; twin B breech IV pain medicine fentanyl; offered epidural; pt considering Will continue to monitor; will plan to start pitocin ~1200  Andrena Mews, DO [facility] Resident - PGY-1 04/19/2012 9:40 AM

## 2012-04-19 NOTE — Progress Notes (Signed)
Carrie Buchanan is a 36 y.o. [id number] at [redacted]w[redacted]d Subjective: Pt has epidural placed.  Objective: BP 140/74  Pulse 61  Temp 98.5 F (36.9 C) (Oral)  Resp 16  Ht 4\' 11"  (1.499 m)  Wt 77.565 kg (171 lb)  BMI 34.54 kg/m2  SpO2 99%      FHT:  FHR: 135 & 145 bpm, variability: moderate,  accelerations:  Present,  decelerations:  Absent UC:   irregular, every 4-6 minutes SVE:   Dilation: 1.5 Effacement (%): 80 Station: -2 Exam by:: Carrie Buchanan ([email or web address])  Labs: Lab Results  Component Value Date   WBC 9.2 04/19/2012   HGB 11.3* 04/19/2012   HCT 34.0* 04/19/2012   MCV 94.4 04/19/2012   PLT 141* 04/19/2012    Assessment / Plan: Protracted latent phase  Labor: Will augment labor Preeclampsia:  n/a Fetal Wellbeing:  Category I Pain Control:  Epidural I/D:  n/a Anticipated MOD:  NSVD  Carrie Mews, DO [facility] Resident - PGY-1 04/19/2012 12:29 PM

## 2012-04-19 NOTE — MAU Note (Signed)
Pt reports SROM, clear fluid, at 0600.  Pt denies uc's or bleeding.

## 2012-04-19 NOTE — Progress Notes (Signed)
[facility] Department of Clinical Social Work Documentation of Interpretation   I assisted ___Judy  RN________________ with interpretation of ____questions__________________ for this patient. 

## 2012-04-19 NOTE — Progress Notes (Signed)
Dr. Shawnie Pons & Dr. Berline Chough @ bedside.  Sonosite U/S done to verify presentation - Baby A vertex. Baby B breech.

## 2012-04-19 NOTE — Progress Notes (Signed)
CNM informed of pt status, states someone from F/P will be coming to see pt.

## 2012-04-19 NOTE — Anesthesia Procedure Notes (Signed)
Epidural Patient location during procedure: OB Start time: 04/19/2012 12:08 PM  Staffing Anesthesiologist: Brayton Caves R Performed by: anesthesiologist   Preanesthetic Checklist Completed: patient identified, site marked, surgical consent, pre-op evaluation, timeout performed, IV checked, risks and benefits discussed and monitors and equipment checked  Epidural Patient position: sitting Prep: site prepped and draped and DuraPrep Patient monitoring: continuous pulse ox and blood pressure Approach: midline Injection technique: LOR air and LOR saline  Needle:  Needle type: Tuohy  Needle gauge: 17 G Needle length: 9 cm Needle insertion depth: 5 cm cm Catheter type: closed end flexible Catheter size: 19 Gauge Catheter at skin depth: 10 cm Test dose: negative  Assessment Events: blood not aspirated, injection not painful, no injection resistance, negative IV test and no paresthesia  Additional Notes Patient identified.  Risk benefits discussed including failed block, incomplete pain control, headache, nerve damage, paralysis, blood pressure changes, nausea, vomiting, reactions to medication both toxic or allergic, and postpartum back pain.  Patient expressed understanding and wished to proceed.  All questions were answered.  Sterile technique used throughout procedure and epidural site dressed with sterile barrier dressing. No paresthesia or other complications noted.The patient did not experience any signs of intravascular injection such as tinnitus or metallic taste in mouth nor signs of intrathecal spread such as rapid motor block. Please see nursing notes for vital signs.

## 2012-04-19 NOTE — Anesthesia Preprocedure Evaluation (Addendum)
Anesthesia Evaluation  Patient identified by MRN, date of birth, ID band Patient awake    Reviewed: Allergy & Precautions, H&P , Patient's Chart, lab work & pertinent test results  Airway Mallampati: II TM Distance: >3 FB Neck ROM: full    Dental No notable dental hx.    Pulmonary neg pulmonary ROS,  breath sounds clear to auscultation  Pulmonary exam normal       Cardiovascular negative cardio ROS  Rhythm:regular Rate:Normal     Neuro/Psych negative neurological ROS  negative psych ROS   GI/Hepatic negative GI ROS, Neg liver ROS,   Endo/Other  negative endocrine ROS  Renal/GU negative Renal ROS     Musculoskeletal   Abdominal   Peds  Hematology negative hematology ROS (+)   Anesthesia Other Findings   Reproductive/Obstetrics (+) Pregnancy                           Anesthesia Physical Anesthesia Plan  ASA: II  Anesthesia Plan: Epidural   Post-op Pain Management:    Induction:   Airway Management Planned:   Additional Equipment:   Intra-op Plan:   Post-operative Plan:   Informed Consent: I have reviewed the patients History and Physical, chart, labs and discussed the procedure including the risks, benefits and alternatives for the proposed anesthesia with the patient or authorized representative who has indicated his/her understanding and acceptance.     Plan Discussed with:   Anesthesia Plan Comments:        twins Anesthesia Quick Evaluation

## 2012-04-20 LAB — CBC
HCT: 31.2 % — ABNORMAL LOW (ref 36.0–46.0)
HCT: 28.6 % — ABNORMAL LOW (ref 36.0–46.0)
Hemoglobin: 10.6 g/dL — ABNORMAL LOW (ref 12.0–15.0)
Hemoglobin: 9.8 g/dL — ABNORMAL LOW (ref 12.0–15.0)
MCH: 31.6 pg (ref 26.0–34.0)
MCH: 31.7 pg (ref 26.0–34.0)
MCHC: 34.3 g/dL (ref 30.0–36.0)
MCV: 93.1 fL (ref 78.0–100.0)
MCV: 92.6 fL (ref 78.0–100.0)
Platelets: 155 10*3/uL (ref 150–400)
RBC: 3.35 MIL/uL — ABNORMAL LOW (ref 3.87–5.11)
WBC: 17.2 10*3/uL — ABNORMAL HIGH (ref 4.0–10.5)

## 2012-04-20 LAB — COMPREHENSIVE METABOLIC PANEL
Alkaline Phosphatase: 185 U/L — ABNORMAL HIGH (ref 39–117)
BUN: 11 mg/dL (ref 6–23)
Creatinine, Ser: 0.78 mg/dL (ref 0.50–1.10)
GFR calc Af Amer: >90 mL/min (ref >90)
Glucose, Bld: 79 mg/dL (ref 70–99)
Potassium: 4.6 mEq/L (ref 3.5–5.1)
Total Bilirubin: 0.4 mg/dL (ref 0.3–1.2)
Total Protein: 4.7 g/dL — ABNORMAL LOW (ref 6.0–8.3)

## 2012-04-20 NOTE — Anesthesia Postprocedure Evaluation (Signed)
  Anesthesia Post-op Note  Patient: Carrie Buchanan  Procedure(s) Performed: * No procedures listed *  Patient Location: Mother/Baby  Anesthesia Type: Epidural  Level of Consciousness: awake, alert  and oriented  Airway and Oxygen Therapy: Patient Spontanous Breathing  Post-op Pain: mild  Post-op Assessment: Post-op Vital signs reviewed, Patient's Cardiovascular Status Stable, No headache, No backache, No residual numbness and No residual motor weakness  Post-op Vital Signs: Reviewed and stable  Complications: No apparent anesthesia complications

## 2012-04-20 NOTE — Progress Notes (Signed)
Ur chart review completed.  

## 2012-04-20 NOTE — Progress Notes (Signed)
Spoke with resident regarding possible order for CBC and LR fluids. No order given for CBC and told to let bag of LR fluids finish to gravity and to then saline lock IV.

## 2012-04-20 NOTE — Progress Notes (Signed)
Post Partum Day 1 Subjective: no complaints, up ad lib, voiding and No nausea or vomiting.  NPO for possible BTL today.  Objective: Blood pressure 135/89, pulse 68, temperature 98.3 F (36.8 C), temperature source Oral, resp. rate 20, height 4\' 11"  (1.499 m), weight 77.565 kg (171 lb), SpO2 97.00%, unknown if currently breastfeeding.  Physical Exam:  General: alert, cooperative, appears stated age and no distress Lochia: appropriate Uterine Fundus: firm DVT Evaluation: No evidence of DVT seen on physical exam.   Basename 04/20/12 0530 04/19/12 0950  HGB 10.6* 11.3*  HCT 31.2* 34.0*    Assessment/Plan: Plan for discharge tomorrow.  Breast and bottle feeding.  Would like BTL.  Will discuss with attending.   LOS: 1 day   CRANSTOUN, LANDI 04/20/2012, 7:54 AM    Saw pt. Still is fasting. Dr. Marice Potter is waiting to hear from OR if BTL will be done.  States bleeding is light. Denies H/A, visual sx. FF at u+3 fb.  BPs 130's 150's over 70's -89. DTR 1-2+. P: Watch subinvolution Will check preE labs.   Maximos Zayas 04/20/2012 11:08 AM

## 2012-04-21 DIAGNOSIS — O30009 Twin pregnancy, unspecified number of placenta and unspecified number of amniotic sacs, unspecified trimester: Secondary | ICD-10-CM

## 2012-04-21 MED ORDER — IBUPROFEN 600 MG PO TABS: 600.0000 mg | ORAL_TABLET | Freq: Four times a day (QID) | ORAL | Status: AC

## 2012-04-21 NOTE — Progress Notes (Signed)
[facility] Department of Clinical Social Work Documentation of Interpretation   I assisted __Faculty Practice_________________ with interpretation of ___discharge plan___________________ for this patient.

## 2012-04-21 NOTE — Discharge Summary (Signed)
Obstetric Discharge Summary Reason for Admission: rupture of membranes Prenatal Procedures: ultrasound Intrapartum Procedures: spontaneous vaginal delivery with Baby A vertex and Baby B double footling breech   Postpartum Procedures: none Complications-Operative and Postpartum: hemorrhage Hgb 11.3>9.8 Hemoglobin  Date Value Range Status  04/20/2012 9.8* 12.0 - 15.0 g/dL Final     HCT  Date Value Range Status  04/20/2012 28.6* 36.0 - 46.0 % Final    Physical Exam:  General: alert, cooperative and no distress Lochia: appropriate Uterine Fundus: firm, -1 Incision: N/A DVT Evaluation: No evidence of DVT seen on physical exam. Negative Homan's sign. No cords or calf tenderness. No significant calf/ankle edema.  Discharge Diagnoses: Term Pregnancy-delivered  Discharge Information: Date: 04/21/2012 Activity: pelvic rest Diet: routine Medications: Ibuprofen Condition: stable Instructions: refer to practice specific booklet Discharge to: home   Newborn Data:   Carrie Buchanan, [place] [[id number]]  Live born female  Birth Weight: 6 lb 2.4 oz (2790 g) APGAR: 9, 9   Carrie Buchanan, Dress [[id number]]  Live born female  Birth Weight: 6 lb 7 oz (2920 g) APGAR: 5, 9  Home with mother.  Carrie Buchanan 04/21/2012, 8:34 AM

## 2012-04-22 LAB — TYPE AND SCREEN: Unit division: 0

## 2012-05-18 ENCOUNTER — Encounter: Payer: Self-pay | Admitting: Family

## 2012-05-18 ENCOUNTER — Ambulatory Visit: Payer: Self-pay | Admitting: Family

## 2012-05-18 VITALS — BP 107/63 | HR 80 | Temp 97.5°F | Ht 59.0 in | Wt 135.9 lb

## 2012-05-18 DIAGNOSIS — Z3009 Encounter for other general counseling and advice on contraception: Secondary | ICD-10-CM

## 2012-05-18 LAB — POCT PREGNANCY, URINE: Preg Test, Ur: NEGATIVE

## 2012-05-18 MED ORDER — MEDROXYPROGESTERONE ACETATE 150 MG/ML IM SUSP: 150.0000 mg | INTRAMUSCULAR | Status: DC

## 2012-05-18 MED ORDER — MEDROXYPROGESTERONE ACETATE 150 MG/ML IM SUSP
150.0000 mg | INTRAMUSCULAR | Status: AC
  Administered 2012-05-18: 150 mg via INTRAMUSCULAR

## 2012-05-18 NOTE — Progress Notes (Unsigned)
  Subjective:     Carrie Buchanan is a 36 y.o. female who presents for a postpartum visit. She is 4 weeks postpartum following a spontaneous vaginal delivery. I have fully reviewed the prenatal and intrapartum course. The delivery was at 38 gestational weeks. Outcome: spontaneous vaginal delivery. Anesthesia: epidural. Postpartum course has been unremarkable. Babies course has been unremarkable. Baby is feeding by breast. Bleeding no bleeding, stopped 12 days after. Bowel function is normal. Bladder function is normal. Patient is sexually active. Contraception method is none. Postpartum depression screening: negative.  Desires depo provera for birth control.  The following portions of the patient's history were reviewed and updated as appropriate: allergies, current medications, past family history, past medical history, past social history, past surgical history and problem list.  Review of Systems Pertinent items are noted in HPI.   Objective:    BP 107/63  Pulse 80  Temp 97.5 F (36.4 C) (Oral)  Ht 4\' 11"  (1.499 m)  Wt 135 lb 14.4 oz (61.644 kg)  BMI 27.45 kg/m2  General:  alert, cooperative and appears stated age   Breasts:  inspection negative, no nipple discharge or bleeding, no masses or nodularity palpable  Lungs: clear to auscultation bilaterally  Heart:  regular rate and rhythm, S1, S2 normal, no murmur, click, rub or gallop  Abdomen: soft, non-tender; bowel sounds normal; no masses,  no organomegaly   Vulva:  normal  Vagina: normal vagina, no discharge, exudate, lesion, or erythema  Cervix:  no cervical motion tenderness  Corpus: normal size, contour, position, consistency, mobility, non-tender  Adnexa:  normal adnexa  Rectal Exam: Normal rectovaginal exam        Assessment:    Normal postpartum exam. Pap smear not done at today's visit.   Plan:    1. Contraception: Depo-Provera injections 2. Pap smear due 3. Follow up in: 3 months or as needed.

## 2012-08-03 ENCOUNTER — Ambulatory Visit: Payer: Medicaid Other

## 2012-09-02 HISTORY — PX: INTRAUTERINE DEVICE INSERTION: SHX323

## 2013-02-09 ENCOUNTER — Inpatient Hospital Stay (HOSPITAL_COMMUNITY): Payer: Medicaid Other | Admitting: Anesthesiology

## 2013-02-09 ENCOUNTER — Encounter (HOSPITAL_COMMUNITY): Admission: EM | Disposition: A | Discharge status: Home / Self Care | Payer: Self-pay | Source: Home / Self Care

## 2013-02-09 ENCOUNTER — Encounter (HOSPITAL_COMMUNITY): Payer: Self-pay | Admitting: Anesthesiology

## 2013-02-09 ENCOUNTER — Emergency Department (HOSPITAL_COMMUNITY): Payer: Medicaid Other

## 2013-02-09 ENCOUNTER — Observation Stay (HOSPITAL_COMMUNITY)
Admission: EM | Admit: 2013-02-09 | Discharge: 2013-02-11 | Disposition: A | Discharge status: Home / Self Care | Payer: Medicaid Other | Attending: General Surgery | Admitting: General Surgery

## 2013-02-09 ENCOUNTER — Encounter (HOSPITAL_COMMUNITY): Payer: Self-pay | Admitting: Emergency Medicine

## 2013-02-09 DIAGNOSIS — K3580 Unspecified acute appendicitis: Secondary | ICD-10-CM

## 2013-02-09 DIAGNOSIS — K3533 Acute appendicitis with perforation and localized peritonitis, with abscess: Secondary | ICD-10-CM | POA: Diagnosis present

## 2013-02-09 HISTORY — DX: Acute appendicitis with perforation and localized peritonitis, with abscess: K35.33

## 2013-02-09 HISTORY — PX: LAPAROSCOPIC APPENDECTOMY: SHX408

## 2013-02-09 HISTORY — DX: Other specified health status: Z78.9

## 2013-02-09 LAB — COMPREHENSIVE METABOLIC PANEL
ALT: 36 U/L — ABNORMAL HIGH (ref 0–35)
AST: 33 U/L (ref 0–37)
Albumin: 4.4 g/dL (ref 3.5–5.2)
Alkaline Phosphatase: 97 U/L (ref 39–117)
Chloride: 102 mEq/L (ref 96–112)
Potassium: 4 mEq/L (ref 3.5–5.1)
Total Bilirubin: 0.5 mg/dL (ref 0.3–1.2)

## 2013-02-09 LAB — URINALYSIS, MICROSCOPIC ONLY
Bilirubin Urine: NEGATIVE
Glucose, UA: NEGATIVE mg/dL
Ketones, ur: 15 mg/dL — AB
pH: 5 (ref 5.0–8.0)

## 2013-02-09 LAB — CBC WITH DIFFERENTIAL/PLATELET
Basophils Absolute: 0 10*3/uL (ref 0.0–0.1)
Basophils Relative: 0 % (ref 0–1)
Hemoglobin: 13.9 g/dL (ref 12.0–15.0)
MCHC: 35.4 g/dL (ref 30.0–36.0)
Neutro Abs: 12 10*3/uL — ABNORMAL HIGH (ref 1.7–7.7)
Neutrophils Relative %: 80 % — ABNORMAL HIGH (ref 43–77)
RDW: 13.3 % (ref 11.5–15.5)
WBC: 15 10*3/uL — ABNORMAL HIGH (ref 4.0–10.5)

## 2013-02-09 LAB — PREGNANCY, URINE: Preg Test, Ur: NEGATIVE

## 2013-02-09 LAB — WET PREP, GENITAL: Yeast Wet Prep HPF POC: NONE SEEN

## 2013-02-09 SURGERY — APPENDECTOMY, LAPAROSCOPIC
Anesthesia: General | Site: Abdomen | Wound class: Contaminated

## 2013-02-09 MED ORDER — MORPHINE SULFATE 2 MG/ML IJ SOLN: 1.0000 mg | INTRAMUSCULAR | Status: DC | PRN

## 2013-02-09 MED ORDER — KCL IN DEXTROSE-NACL 20-5-0.45 MEQ/L-%-% IV SOLN
INTRAVENOUS | Status: DC
  Administered 2013-02-09: 16:00:00 via INTRAVENOUS
  Filled 2013-02-09 (×3): qty 1000

## 2013-02-09 MED ORDER — PROPOFOL 10 MG/ML IV BOLUS
INTRAVENOUS | Status: DC | PRN
  Administered 2013-02-09: 200 mg via INTRAVENOUS

## 2013-02-09 MED ORDER — DIPHENHYDRAMINE HCL 12.5 MG/5ML PO ELIX: 12.5000 mg | ORAL_SOLUTION | Freq: Four times a day (QID) | ORAL | Status: DC | PRN

## 2013-02-09 MED ORDER — DIPHENHYDRAMINE HCL 50 MG/ML IJ SOLN: 12.5000 mg | Freq: Four times a day (QID) | INTRAMUSCULAR | Status: DC | PRN

## 2013-02-09 MED ORDER — FENTANYL CITRATE 0.05 MG/ML IJ SOLN
INTRAMUSCULAR | Status: DC | PRN
  Administered 2013-02-09 (×2): 50 ug via INTRAVENOUS
  Administered 2013-02-09: 100 ug via INTRAVENOUS

## 2013-02-09 MED ORDER — OXYCODONE HCL 5 MG PO TABS
5.0000 mg | ORAL_TABLET | ORAL | Status: DC | PRN
  Administered 2013-02-10 – 2013-02-11 (×4): 5 mg via ORAL
  Filled 2013-02-09 (×4): qty 1

## 2013-02-09 MED ORDER — SODIUM CHLORIDE 0.9 % IV SOLN
INTRAVENOUS | Status: DC
  Administered 2013-02-09: 10:00:00 via INTRAVENOUS

## 2013-02-09 MED ORDER — GLYCOPYRROLATE 0.2 MG/ML IJ SOLN
INTRAMUSCULAR | Status: DC | PRN
  Administered 2013-02-09: 0.4 mg via INTRAVENOUS

## 2013-02-09 MED ORDER — SODIUM CHLORIDE 0.9 % IR SOLN
Status: DC | PRN
  Administered 2013-02-09: 1000 mL

## 2013-02-09 MED ORDER — SODIUM CHLORIDE 0.9 % IV SOLN
INTRAVENOUS | Status: DC
  Administered 2013-02-09: 1000 mL via INTRAVENOUS
  Administered 2013-02-10: 75 mL/h via INTRAVENOUS
  Administered 2013-02-10: 12:00:00 via INTRAVENOUS

## 2013-02-09 MED ORDER — IOHEXOL 300 MG/ML  SOLN
80.0000 mL | Freq: Once | INTRAMUSCULAR | Status: AC | PRN
  Administered 2013-02-09: 80 mL via INTRAVENOUS

## 2013-02-09 MED ORDER — ONDANSETRON HCL 4 MG/2ML IJ SOLN
4.0000 mg | Freq: Four times a day (QID) | INTRAMUSCULAR | Status: DC | PRN
  Filled 2013-02-09: qty 2

## 2013-02-09 MED ORDER — NEOSTIGMINE METHYLSULFATE 1 MG/ML IJ SOLN
INTRAMUSCULAR | Status: DC | PRN
  Administered 2013-02-09: 3 mg via INTRAVENOUS

## 2013-02-09 MED ORDER — MORPHINE SULFATE 2 MG/ML IJ SOLN
2.0000 mg | INTRAMUSCULAR | Status: DC | PRN
  Administered 2013-02-10 (×2): 2 mg via INTRAVENOUS
  Filled 2013-02-09 (×2): qty 1

## 2013-02-09 MED ORDER — ONDANSETRON HCL 4 MG/2ML IJ SOLN
4.0000 mg | Freq: Four times a day (QID) | INTRAMUSCULAR | Status: DC | PRN
  Administered 2013-02-09: 4 mg via INTRAVENOUS

## 2013-02-09 MED ORDER — MIDAZOLAM HCL 5 MG/5ML IJ SOLN
INTRAMUSCULAR | Status: DC | PRN
  Administered 2013-02-09: 2 mg via INTRAVENOUS

## 2013-02-09 MED ORDER — IOHEXOL 300 MG/ML  SOLN
25.0000 mL | INTRAMUSCULAR | Status: AC
  Administered 2013-02-09 (×2): 25 mL via ORAL

## 2013-02-09 MED ORDER — HYDROMORPHONE HCL PF 1 MG/ML IJ SOLN
0.2500 mg | INTRAMUSCULAR | Status: DC | PRN
  Administered 2013-02-09 (×2): 0.5 mg via INTRAVENOUS

## 2013-02-09 MED ORDER — ROCURONIUM BROMIDE 100 MG/10ML IV SOLN
INTRAVENOUS | Status: DC | PRN
  Administered 2013-02-09: 40 mg via INTRAVENOUS

## 2013-02-09 MED ORDER — SODIUM CHLORIDE 0.9 % IV SOLN
1.0000 g | INTRAVENOUS | Status: DC
  Administered 2013-02-09: 1 g via INTRAVENOUS
  Filled 2013-02-09: qty 1

## 2013-02-09 MED ORDER — MORPHINE SULFATE 4 MG/ML IJ SOLN
4.0000 mg | INTRAMUSCULAR | Status: DC | PRN
  Administered 2013-02-09: 4 mg via INTRAVENOUS
  Filled 2013-02-09: qty 1

## 2013-02-09 MED ORDER — ACETAMINOPHEN 325 MG PO TABS
650.0000 mg | ORAL_TABLET | Freq: Four times a day (QID) | ORAL | Status: DC | PRN
  Administered 2013-02-10: 650 mg via ORAL
  Filled 2013-02-09: qty 1
  Filled 2013-02-09: qty 2
  Filled 2013-02-09: qty 1

## 2013-02-09 MED ORDER — ONDANSETRON HCL 4 MG/2ML IJ SOLN
INTRAMUSCULAR | Status: DC | PRN
  Administered 2013-02-09: 4 mg via INTRAVENOUS

## 2013-02-09 MED ORDER — LACTATED RINGERS IV SOLN
INTRAVENOUS | Status: DC | PRN
  Administered 2013-02-09: 17:00:00 via INTRAVENOUS

## 2013-02-09 MED ORDER — BUPIVACAINE-EPINEPHRINE 0.25% -1:200000 IJ SOLN
INTRAMUSCULAR | Status: DC | PRN
  Administered 2013-02-09: 10 mL

## 2013-02-09 MED ORDER — HYDROMORPHONE HCL PF 1 MG/ML IJ SOLN
INTRAMUSCULAR | Status: AC
  Filled 2013-02-09: qty 1

## 2013-02-09 MED ORDER — ACETAMINOPHEN 650 MG RE SUPP: 650.0000 mg | Freq: Four times a day (QID) | RECTAL | Status: DC | PRN

## 2013-02-09 MED ORDER — ONDANSETRON HCL 4 MG/2ML IJ SOLN
4.0000 mg | INTRAMUSCULAR | Status: DC | PRN
  Administered 2013-02-09: 4 mg via INTRAVENOUS
  Filled 2013-02-09: qty 2

## 2013-02-09 MED ORDER — LIDOCAINE HCL (CARDIAC) 20 MG/ML IV SOLN
INTRAVENOUS | Status: DC | PRN
  Administered 2013-02-09: 80 mg via INTRAVENOUS

## 2013-02-09 MED ORDER — SODIUM CHLORIDE 0.9 % IV SOLN
1.0000 g | Freq: Once | INTRAVENOUS | Status: DC
  Filled 2013-02-09: qty 1

## 2013-02-09 MED ORDER — LACTATED RINGERS IV SOLN: INTRAVENOUS | Status: DC

## 2013-02-09 SURGICAL SUPPLY — 42 items
ADH SKN CLS APL DERMABOND .7 (GAUZE/BANDAGES/DRESSINGS) ×1
ADH SKN CLS LQ APL DERMABOND (GAUZE/BANDAGES/DRESSINGS) ×1
APPLIER CLIP ROT 10 11.4 M/L (STAPLE)
APR CLP MED LRG 11.4X10 (STAPLE)
BAG SPEC RTRVL LRG 6X4 10 (ENDOMECHANICALS) ×1
CANISTER SUCTION 2500CC (MISCELLANEOUS) ×2 IMPLANT
CHLORAPREP W/TINT 26ML (MISCELLANEOUS) ×2 IMPLANT
CLIP APPLIE ROT 10 11.4 M/L (STAPLE) IMPLANT
CLOTH BEACON ORANGE TIMEOUT ST (SAFETY) ×2 IMPLANT
COVER SURGICAL LIGHT HANDLE (MISCELLANEOUS) ×2 IMPLANT
CUTTER FLEX LINEAR 45M (STAPLE) ×2 IMPLANT
DERMABOND ADHESIVE PROPEN (GAUZE/BANDAGES/DRESSINGS) ×1
DERMABOND ADVANCED (GAUZE/BANDAGES/DRESSINGS) ×1
DERMABOND ADVANCED .7 DNX12 (GAUZE/BANDAGES/DRESSINGS) ×1 IMPLANT
DERMABOND ADVANCED .7 DNX6 (GAUZE/BANDAGES/DRESSINGS) IMPLANT
ELECT REM PT RETURN 9FT ADLT (ELECTROSURGICAL) ×2
ELECTRODE REM PT RTRN 9FT ADLT (ELECTROSURGICAL) ×1 IMPLANT
GLOVE BIO SURGEON STRL SZ7 (GLOVE) ×2 IMPLANT
GLOVE BIOGEL PI IND STRL 7.5 (GLOVE) ×1 IMPLANT
GLOVE BIOGEL PI INDICATOR 7.5 (GLOVE) ×1
GOWN STRL NON-REIN LRG LVL3 (GOWN DISPOSABLE) ×6 IMPLANT
KIT BASIN OR (CUSTOM PROCEDURE TRAY) ×2 IMPLANT
KIT ROOM TURNOVER OR (KITS) ×2 IMPLANT
NS IRRIG 1000ML POUR BTL (IV SOLUTION) ×2 IMPLANT
PAD ARMBOARD 7.5X6 YLW CONV (MISCELLANEOUS) ×4 IMPLANT
POUCH SPECIMEN RETRIEVAL 10MM (ENDOMECHANICALS) ×2 IMPLANT
RELOAD 45 VASCULAR/THIN (ENDOMECHANICALS) ×2 IMPLANT
RELOAD STAPLE 45 2.5 WHT GRN (ENDOMECHANICALS) ×1 IMPLANT
RELOAD STAPLE 45 3.5 BLU ETS (ENDOMECHANICALS) IMPLANT
RELOAD STAPLE TA45 3.5 REG BLU (ENDOMECHANICALS) IMPLANT
SCALPEL HARMONIC ACE (MISCELLANEOUS) ×2 IMPLANT
SCISSORS LAP 5X35 DISP (ENDOMECHANICALS) IMPLANT
SET IRRIG TUBING LAPAROSCOPIC (IRRIGATION / IRRIGATOR) ×2 IMPLANT
SLEEVE ENDOPATH XCEL 5M (ENDOMECHANICALS) ×2 IMPLANT
SPECIMEN JAR SMALL (MISCELLANEOUS) ×2 IMPLANT
SUT MNCRL AB 4-0 PS2 18 (SUTURE) ×2 IMPLANT
TOWEL OR 17X24 6PK STRL BLUE (TOWEL DISPOSABLE) ×2 IMPLANT
TOWEL OR 17X26 10 PK STRL BLUE (TOWEL DISPOSABLE) ×2 IMPLANT
TRAY FOLEY CATH 14FR (SET/KITS/TRAYS/PACK) ×2 IMPLANT
TRAY LAPAROSCOPIC (CUSTOM PROCEDURE TRAY) ×2 IMPLANT
TROCAR XCEL BLUNT TIP 100MML (ENDOMECHANICALS) ×2 IMPLANT
TROCAR XCEL NON-BLD 5MMX100MML (ENDOMECHANICALS) ×2 IMPLANT

## 2013-02-09 NOTE — Transfer of Care (Signed)
Immediate Anesthesia Transfer of Care Note  Patient: Carrie Buchanan  Procedure(s) Performed: Procedure(s): APPENDECTOMY LAPAROSCOPIC (N/A)  Patient Location: PACU  Anesthesia Type:General  Level of Consciousness: awake, alert  and oriented  Airway & Oxygen Therapy: Patient Spontanous Breathing and Patient connected to nasal cannula oxygen  Post-op Assessment: Report given to PACU RN  Post vital signs: Reviewed and stable  Complications: No apparent anesthesia complications

## 2013-02-09 NOTE — Op Note (Signed)
Preoperative diagnosis: Acute appendicitis Postoperative diagnosis: Acute suppurative appendicitis Procedure: Laparoscopic appendectomy Surgeon: Dr. Harden Mo Anesthesia: General endotracheal Estimated blood loss: Minimal Drains: None Complications: None Specimen: Appendix to pathology Sponge and needle count correct at end of operation Disposition to recovery room in stable condition  Indications: This is a 37 year old female with a several day history of abdominal pain. On her CT scan she has evidence of appendicitis. On her clinical examination she is tender in the right lower quadrant as well. I discussed with her via a translator proceeding with a laparoscopic appendectomy.  Procedure: After informed consent was obtained the patient was taken to the operating room. She had sequential compression devices on her legs. She was administered invanz in the emergency room. She was then placed under general anesthesia without complication. A Foley catheter was placed. Her abdomen was prepped and draped in the standard sterile surgical fashion. A surgical timeout was then performed.  I infiltrated Marcaine below the incision. I then made a vertical incision and carried this out down to the fascia. I incised the fascia sharply. I then entered the peritoneum bluntly. I placed a 0 Vicryl pursestring suture through the fascia. I then inserted a Hassan trocar and insufflated the abdomen to 15 mm mercury pressure. I then placed 2 further 5 mm trocars in the suprapubic and left lower quadrant. The omentum was noted be adherent to the sidewall uncovering what ended up being acute suppurative appendicitis. I released the omentum from the sidewall using the Harmonic scalpel. I also released the lateral attachments of the colon to rotate the appendix into view. I was able to dissect this free from the cecum which it had rolled back on top of. I then encircled the base. I divided the base with the GIA stapler.  I then divided the mesentery with the Harmonic scalpel. I placed this in an Endo Catch bag and removed it from the umbilicus. I irrigated this area. There was no evidence of any injury to the cecum and terminal ileum. The staple line was hemostatic and clean. The irrigation was clear. I then removed the umbilical trocar and tied this down and it completely obliterated by defect. I then removed my remaining trocars and desufflated the abdomen. She tolerated this well was extubated and transferred to recovery stable.

## 2013-02-09 NOTE — Preoperative (Signed)
Beta Blockers   Reason not to administer Beta Blockers:Not Applicable 

## 2013-02-09 NOTE — Anesthesia Postprocedure Evaluation (Signed)
  Anesthesia Post-op Note  Patient: Carrie Buchanan  Procedure(s) Performed: Procedure(s): APPENDECTOMY LAPAROSCOPIC (N/A)  Patient Location: PACU  Anesthesia Type:General  Level of Consciousness: awake and alert   Airway and Oxygen Therapy: Patient Spontanous Breathing and Patient connected to nasal cannula oxygen  Post-op Pain: mild  Post-op Assessment: Post-op Vital signs reviewed, Patient's Cardiovascular Status Stable, Respiratory Function Stable, Patent Airway and No signs of Nausea or vomiting  Post-op Vital Signs: Reviewed and stable  Complications: No apparent anesthesia complications

## 2013-02-09 NOTE — Anesthesia Preprocedure Evaluation (Addendum)
Anesthesia Evaluation  Patient identified by MRN, date of birth, ID band Patient awake    Reviewed: Allergy & Precautions, H&P , NPO status , Patient's Chart, lab work & pertinent test results  Airway Mallampati: II TM Distance: >3 FB Neck ROM: Full    Dental  (+) Teeth Intact, Dental Advisory Given and Caps,    Pulmonary neg pulmonary ROS,          Cardiovascular negative cardio ROS      Neuro/Psych    GI/Hepatic Neg liver ROS, History of abdominal pain.   Endo/Other  negative endocrine ROS  Renal/GU negative Renal ROS     Musculoskeletal   Abdominal   Peds  Hematology   Anesthesia Other Findings   Reproductive/Obstetrics                          Anesthesia Physical Anesthesia Plan  ASA: II  Anesthesia Plan: General   Post-op Pain Management:    Induction: Intravenous  Airway Management Planned: Oral ETT  Additional Equipment:   Intra-op Plan:   Post-operative Plan:   Informed Consent:   Dental advisory given  Plan Discussed with: CRNA, Anesthesiologist and Surgeon  Anesthesia Plan Comments:         Anesthesia Quick Evaluation

## 2013-02-09 NOTE — ED Notes (Addendum)
Pt reports RUQ pain with n/v, has not eaten today. Has not taken any OTC medications.

## 2013-02-09 NOTE — Progress Notes (Signed)
Belongings with pt

## 2013-02-09 NOTE — ED Notes (Signed)
Pt c/o right sided abd pain with vomiting; pt denies dysuria

## 2013-02-09 NOTE — ED Provider Notes (Signed)
History     CSN: [id number]  Arrival date & time 02/09/13  0821   First MD Initiated Contact with Patient 02/09/13 0930      Chief Complaint  Patient presents with  . Abdominal Pain     HPI Pt was seen at 0940.   Per pt, c/o gradual onset and persistence of constant RLQ abd "pain" for the past 3 days.  Has been associated with multiple intermittent episodes of N/V.  Describes the abd pain as starting diffusely, now located in her RLQ.  Denies diarrhea, no fevers, no back pain, no rash, no CP/SOB, no black or blood in stools or emesis, no dysuria, no vaginal bleeding/discharge.       History reviewed. No pertinent past medical history.  History reviewed. No pertinent past surgical history.  Family History  Problem Relation Age of Onset  . Asthma Mother     History  Substance Use Topics  . Smoking status: Never Smoker   . Smokeless tobacco: Not on file  . Alcohol Use: No    OB History   Grav Para Term Preterm Abortions TAB SAB Ect Mult Living   7 6 6  1  1  1 7       Review of Systems ROS: Statement: All systems negative except as marked or noted in the HPI; Constitutional: Negative for fever and chills. ; ; Eyes: Negative for eye pain, redness and discharge. ; ; ENMT: Negative for ear pain, hoarseness, nasal congestion, sinus pressure and sore throat. ; ; Cardiovascular: Negative for chest pain, palpitations, diaphoresis, dyspnea and peripheral edema. ; ; Respiratory: Negative for cough, wheezing and stridor. ; ; Gastrointestinal: +N/V, abd pain. Negative for diarrhea, blood in stool, hematemesis, jaundice and rectal bleeding. . ; ; Genitourinary: Negative for dysuria, flank pain and hematuria. ; ; Musculoskeletal: Negative for back pain and neck pain. Negative for swelling and trauma.; ; Skin: Negative for pruritus, rash, abrasions, blisters, bruising and skin lesion.; ; Neuro: Negative for headache, lightheadedness and neck stiffness. Negative for weakness, altered level of  consciousness , altered mental status, extremity weakness, paresthesias, involuntary movement, seizure and syncope.       Allergies  Review of patient's allergies indicates no known allergies.  Home Medications   Current Outpatient Rx  Name  Route  Sig  Dispense  Refill  . Multiple Vitamins-Minerals (MULTIVITAMIN WITH MINERALS) tablet   Oral   Take 1 tablet by mouth daily.           BP 119/85  Pulse 70  Temp(Src) 97.8 F (36.6 C) (Oral)  Resp 16  SpO2 100%  Physical Exam 0945: Physical examination:  Nursing notes reviewed; Vital signs and O2 SAT reviewed;  Constitutional: Well developed, Well nourished, Well hydrated, Uncomfortable appearing; Head:  Normocephalic, atraumatic; Eyes: EOMI, PERRL, No scleral icterus; ENMT: Mouth and pharynx normal, Mucous membranes moist; Neck: Supple, Full range of motion, No lymphadenopathy; Cardiovascular: Regular rate and rhythm, No murmur, rub, or gallop; Respiratory: Breath sounds clear & equal bilaterally, No rales, rhonchi, wheezes.  Speaking full sentences with ease, Normal respiratory effort/excursion; Chest: Nontender, Movement normal; Abdomen: Soft, +diffusely tender, esp RLQ. Nondistended, Normal bowel sounds; Genitourinary: No CVA tenderness. Pelvic exam performed with permission of pt and female ED RN assist during exam.  External genitalia w/o lesions. Vaginal vault with thick white discharge. No bleeding. Cervix w/o lesions, not friable, GC/chlam and wet prep obtained and sent to lab.  Bimanual exam w/o CMT, uterine or adnexal tenderness.;;; Extremities: Pulses normal,  No tenderness, No edema, No calf edema or asymmetry.; Neuro: AA&Ox3, Major CN grossly intact.  Speech clear. No gross focal motor or sensory deficits in extremities.; Skin: Color normal, Warm, Dry.   ED Course  Procedures      MDM  MDM Reviewed: previous chart, nursing note and vitals Reviewed previous: labs Interpretation: labs, ultrasound and CT  scan   Results for orders placed during the hospital encounter of 02/09/13  CBC WITH DIFFERENTIAL      Result Value Range   WBC 15.0 (*) 4.0 - 10.5 K/uL   RBC 4.50  3.87 - 5.11 MIL/uL   Hemoglobin 13.9  12.0 - 15.0 g/dL   HCT 16.1  09.6 - 04.5 %   MCV 87.3  78.0 - 100.0 fL   MCH 30.9  26.0 - 34.0 pg   MCHC 35.4  30.0 - 36.0 g/dL   RDW 40.9  81.1 - 91.4 %   Platelets 280  150 - 400 K/uL   Neutrophils Relative 80 (*) 43 - 77 %   Neutro Abs 12.0 (*) 1.7 - 7.7 K/uL   Lymphocytes Relative 15  12 - 46 %   Lymphs Abs 2.2  0.7 - 4.0 K/uL   Monocytes Relative 5  3 - 12 %   Monocytes Absolute 0.7  0.1 - 1.0 K/uL   Eosinophils Relative 1  0 - 5 %   Eosinophils Absolute 0.1  0.0 - 0.7 K/uL   Basophils Relative 0  0 - 1 %   Basophils Absolute 0.0  0.0 - 0.1 K/uL  COMPREHENSIVE METABOLIC PANEL      Result Value Range   Sodium 137  135 - 145 mEq/L   Potassium 4.0  3.5 - 5.1 mEq/L   Chloride 102  96 - 112 mEq/L   CO2 25  19 - 32 mEq/L   Glucose, Bld 134 (*) 70 - 99 mg/dL   BUN 26 (*) 6 - 23 mg/dL   Creatinine, Ser 7.82  0.50 - 1.10 mg/dL   Calcium 9.4  8.4 - 95.6 mg/dL   Total Protein 8.2  6.0 - 8.3 g/dL   Albumin 4.4  3.5 - 5.2 g/dL   AST 33  0 - 37 U/L   ALT 36 (*) 0 - 35 U/L   Alkaline Phosphatase 97  39 - 117 U/L   Total Bilirubin 0.5  0.3 - 1.2 mg/dL   GFR calc non Af Amer >90  >90 mL/min   GFR calc Af Amer >90  >90 mL/min  LIPASE, BLOOD      Result Value Range   Lipase 24  11 - 59 U/L  URINALYSIS, MICROSCOPIC ONLY      Result Value Range   Color, Urine YELLOW  YELLOW   APPearance TURBID (*) CLEAR   Specific Gravity, Urine 1.035 (*) 1.005 - 1.030   pH 5.0  5.0 - 8.0   Glucose, UA NEGATIVE  NEGATIVE mg/dL   Hgb urine dipstick TRACE (*) NEGATIVE   Bilirubin Urine NEGATIVE  NEGATIVE   Ketones, ur 15 (*) NEGATIVE mg/dL   Protein, ur NEGATIVE  NEGATIVE mg/dL   Urobilinogen, UA 0.2  0.0 - 1.0 mg/dL   Nitrite NEGATIVE  NEGATIVE   Leukocytes, UA SMALL (*) NEGATIVE   WBC, UA  0-2  <3 WBC/hpf   RBC / HPF 0-2  <3 RBC/hpf   Bacteria, UA MANY (*) RARE   Squamous Epithelial / LPF MANY (*) RARE   Urine-Other AMORPHOUS URATES/PHOSPHATES    PREGNANCY,  URINE      Result Value Range   Preg Test, Ur NEGATIVE  NEGATIVE  POCT PREGNANCY, URINE      Result Value Range   Preg Test, Ur NEGATIVE  NEGATIVE    US Pelvis Complete 02/09/2013  *RADIOLOGY REPORT*  Clinical Data:  Right lower quadrant pain.  TRANSABDOMINAL AND TRANSVAGINAL ULTRASOUND OF PELVIS DOPPLER ULTRASOUND OF OVARIES  Technique:  Both transabdominal and transvaginal ultrasound examinations of the pelvis were performed. Transabdominal technique was performed for global imaging of the pelvis including uterus, ovaries, adnexal regions, and pelvic cul-de-sac.  It was necessary to proceed with endovaginal exam following the transabdominal exam to visualize the uterus, endometrium and ovaries.  Color and duplex Doppler ultrasound was utilized to evaluate blood flow to the ovaries.  Comparison:  OB ultrasound 04/07/2012  Findings:  Uterus:  Uterus measures 5.7 x 2.9 x 5.8 cm.  There is a normal anteverted position without gross abnormality.  Endometrium:  An IUD is present.  As a result, endometrium is difficult to evaluate.  Position of IUD is grossly normal.  Right ovary:  Right ovary measures 1.6 x 3.3 x 1.8 cm.  Small follicles in the right ovary.  Vascular flow is identified in the right ovary.  Left ovary:   The left ovary measures 2.3 x 2.2 x 1.0 cm.  There are small follicles within the left ovary.  There is vascular flow in the left ovary.  Pulsed Doppler evaluation demonstrates normal low-resistance arterial and venous waveforms in both ovaries.  IMPRESSION: Normal examination.  IUD is present.  No sonographic evidence for ovarian torsion.   Original Report Authenticated By: Richarda Overlie, M.D.    Ct Abdomen Pelvis W Contrast 02/09/2013  *RADIOLOGY REPORT*  Clinical Data: Abdominal pain.  Nausea, vomiting and diarrhea.  CT  ABDOMEN AND PELVIS WITH CONTRAST  Technique:  Multidetector CT imaging of the abdomen and pelvis was performed following the standard protocol during bolus administration of intravenous contrast.  Contrast: 80mL OMNIPAQUE IOHEXOL 300 MG/ML  SOLN  Comparison: No priors  Findings:  Lung Bases: 2 mm nodule in the anterior aspect of the right middle lobe (image 70 of series 3) is nonspecific, but statistically likely to be benign in this young patient.  Otherwise, unremarkable.  Abdomen/Pelvis:  The appearance of the liver, gallbladder, pancreas, bilateral adrenal glands and bilateral kidneys is unremarkable.  There is mild irregularity of the contour of the spleen anteriorly, which is nonspecific, but may suggest prior splenic trauma.  An IUD is in place within the uterus.  The uterus and ovaries are unremarkable in appearance.  No significant volume of ascites.  No pneumoperitoneum.  No pathologic distension of small bowel.  No definite pathologic lymphadenopathy identified within the abdomen or pelvis.  A trace volume of free fluid the cul- de-sac is presumably physiologic in this young female patient. Urinary bladder is unremarkable in appearance.  The appendix is severely enlarged measuring up to 13 mm in diameter.  Notably, the appendix is surrounded by a large amount of inflammatory changes with some reactive lymphadenopathy in the ileocolic mesentery, and a trace volume of free fluid.  Musculoskeletal: There are no aggressive appearing lytic or blastic lesions noted in the visualized portions of the skeleton. 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  Bilateral pars defects are present at L5.  4 mm of anterolisthesis of L5 upon S1.  IMPRESSION: 1.  Findings, as above, compatible with severe acute appendicitis. No definite findings to suggest appendiceal rupture  at this time. Surgical consultation is highly recommended. 2.  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 3.  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 4.  2 mm nodule in the right middle lobe is almost certainly benign in this young patient. If the patient is at high risk for bronchogenic carcinoma, follow-up chest CT at 1 year is recommended.  If the patient is at low risk, no follow-up is needed.  This recommendation follows the consensus statement: Guidelines for Management of Small Pulmonary Nodules Detected on CT Scans:  A Statement from the [employer]isch[employer] Society as published in Radiology 2005; 237:395-400.  Critical Value/emergent results were called by telephone at the time of interpretation on 02/09/2013 at 01:20 p.m. to Dr. Clarene Duke, who verbally acknowledged these results.   Original Report Authenticated By: Trudie Reed, M.D.      1320:  Pt with acute appendicitis on CT scan.  Will keep NPO, dose IV abx.  Dx and testing d/w pt and family.  Questions answered.  Verb understanding, agreeable to admit. T/C to General Surgery, case discussed, including:  HPI, pertinent PM/SHx, VS/PE, dx testing, ED course and treatment:  Agreeable to admit, requests to dose invanz IV, they will be down to admit.          Laray Anger, DO 02/11/13 1140

## 2013-02-09 NOTE — H&P (Signed)
Carrie Buchanan is an 37 y.o. female.   Chief Complaint: Abdominal pain with some nausea and vomiting for the last 4 days. HPI: Patient is a 37 year old female who is 9 months postpartum and breast feeding. She speaks very little [place] and most of our information was through a Nurse, learning disability and her husband. She's  had abdominal pain for about 4 days. It primarily in the right lower quadrant. Work up in the ER shows acute appenicitis. The appendix is enlarged to 13 mm surrounded by a large amount of inflammatory changes with reactive lymphadenopathy and a trace of free fluid. In addition to this there is finding of an IUD. A Tarlov cyst and the sacrum, a 2 mm nodule in the right middle lobe which appears benign. Grade 1 spondylolisthesis L5/S1. WBC 15,000.  Ultrasound was negative.  History reviewed. No pertinent past medical history. She has no prior medical history  History reviewed. No pertinent past surgical history. No previous surgeries Family History  Problem Relation Age of Onset  . Asthma Mother    Social History:  reports that she has never smoked. She does not have any smokeless tobacco history on file. She reports that she does not drink alcohol or use illicit drugs.  Allergies: No Known Allergies Prior to Admission medications   Medication Sig Start Date End Date Taking? Authorizing Provider  Multiple Vitamins-Minerals (MULTIVITAMIN WITH MINERALS) tablet Take 1 tablet by mouth daily.   Yes Historical Provider, MD     (Not in a hospital admission)  Results for orders placed during the hospital encounter of 02/09/13 (from the past 48 hour(s))  CBC WITH DIFFERENTIAL     Status: Abnormal   Collection Time    02/09/13  8:26 AM      Result Value Range   WBC 15.0 (*) 4.0 - 10.5 K/uL   RBC 4.50  3.87 - 5.11 MIL/uL   Hemoglobin 13.9  12.0 - 15.0 g/dL   HCT 16.1  09.6 - 04.5 %   MCV 87.3  78.0 - 100.0 fL   MCH 30.9  26.0 - 34.0 pg   MCHC 35.4  30.0 - 36.0 g/dL   RDW 40.9   81.1 - 91.4 %   Platelets 280  150 - 400 K/uL   Neutrophils Relative 80 (*) 43 - 77 %   Neutro Abs 12.0 (*) 1.7 - 7.7 K/uL   Lymphocytes Relative 15  12 - 46 %   Lymphs Abs 2.2  0.7 - 4.0 K/uL   Monocytes Relative 5  3 - 12 %   Monocytes Absolute 0.7  0.1 - 1.0 K/uL   Eosinophils Relative 1  0 - 5 %   Eosinophils Absolute 0.1  0.0 - 0.7 K/uL   Basophils Relative 0  0 - 1 %   Basophils Absolute 0.0  0.0 - 0.1 K/uL  COMPREHENSIVE METABOLIC PANEL     Status: Abnormal   Collection Time    02/09/13  8:26 AM      Result Value Range   Sodium 137  135 - 145 mEq/L   Potassium 4.0  3.5 - 5.1 mEq/L   Chloride 102  96 - 112 mEq/L   CO2 25  19 - 32 mEq/L   Glucose, Bld 134 (*) 70 - 99 mg/dL   BUN 26 (*) 6 - 23 mg/dL   Creatinine, Ser 7.82  0.50 - 1.10 mg/dL   Calcium 9.4  8.4 - 95.6 mg/dL   Total Protein 8.2  6.0 - 8.3 g/dL  Albumin 4.4  3.5 - 5.2 g/dL   AST 33  0 - 37 U/L   ALT 36 (*) 0 - 35 U/L   Alkaline Phosphatase 97  39 - 117 U/L   Total Bilirubin 0.5  0.3 - 1.2 mg/dL   GFR calc non Af Amer >90  >90 mL/min   GFR calc Af Amer >90  >90 mL/min   Comment:            The eGFR has been calculated     using the CKD EPI equation.     This calculation has not been     validated in all clinical     situations.     eGFR's persistently     <90 mL/min signify     possible Chronic Kidney Disease.  LIPASE, BLOOD     Status: None   Collection Time    02/09/13  8:26 AM      Result Value Range   Lipase 24  11 - 59 U/L  URINALYSIS, MICROSCOPIC ONLY     Status: Abnormal   Collection Time    02/09/13  8:32 AM      Result Value Range   Color, Urine YELLOW  YELLOW   APPearance TURBID (*) CLEAR   Specific Gravity, Urine 1.035 (*) 1.005 - 1.030   pH 5.0  5.0 - 8.0   Glucose, UA NEGATIVE  NEGATIVE mg/dL   Hgb urine dipstick TRACE (*) NEGATIVE   Bilirubin Urine NEGATIVE  NEGATIVE   Ketones, ur 15 (*) NEGATIVE mg/dL   Protein, ur NEGATIVE  NEGATIVE mg/dL   Urobilinogen, UA 0.2  0.0 - 1.0  mg/dL   Nitrite NEGATIVE  NEGATIVE   Leukocytes, UA SMALL (*) NEGATIVE   WBC, UA 0-2  <3 WBC/hpf   RBC / HPF 0-2  <3 RBC/hpf   Bacteria, UA MANY (*) RARE   Squamous Epithelial / LPF MANY (*) RARE   Urine-Other AMORPHOUS URATES/PHOSPHATES    PREGNANCY, URINE     Status: None   Collection Time    02/09/13  8:32 AM      Result Value Range   Preg Test, Ur NEGATIVE  NEGATIVE   Comment:            THE SENSITIVITY OF THIS     METHODOLOGY IS >20 mIU/mL.  POCT PREGNANCY, URINE     Status: None   Collection Time    02/09/13  9:56 AM      Result Value Range   Preg Test, Ur NEGATIVE  NEGATIVE   Comment:            THE SENSITIVITY OF THIS     METHODOLOGY IS >24 mIU/mL  WET PREP, GENITAL     Status: Abnormal   Collection Time    02/09/13  1:10 PM      Result Value Range   Yeast Wet Prep HPF POC NONE SEEN  NONE SEEN   Trich, Wet Prep NONE SEEN  NONE SEEN   Clue Cells Wet Prep HPF POC NONE SEEN  NONE SEEN   WBC, Wet Prep HPF POC FEW (*) NONE SEEN   US Transvaginal Non-ob  02/09/2013  *RADIOLOGY REPORT*  Clinical Data:  Right lower quadrant pain.  TRANSABDOMINAL AND TRANSVAGINAL ULTRASOUND OF PELVIS DOPPLER ULTRASOUND OF OVARIES  Technique:  Both transabdominal and transvaginal ultrasound examinations of the pelvis were performed. Transabdominal technique was performed for global imaging of the pelvis including uterus, ovaries, adnexal regions, and pelvic cul-de-sac.  It  was necessary to proceed with endovaginal exam following the transabdominal exam to visualize the uterus, endometrium and ovaries.  Color and duplex Doppler ultrasound was utilized to evaluate blood flow to the ovaries.  Comparison:  OB ultrasound 04/07/2012  Findings:  Uterus:  Uterus measures 5.7 x 2.9 x 5.8 cm.  There is a normal anteverted position without gross abnormality.  Endometrium:  An IUD is present.  As a result, endometrium is difficult to evaluate.  Position of IUD is grossly normal.  Right ovary:  Right ovary  measures 1.6 x 3.3 x 1.8 cm.  Small follicles in the right ovary.  Vascular flow is identified in the right ovary.  Left ovary:   The left ovary measures 2.3 x 2.2 x 1.0 cm.  There are small follicles within the left ovary.  There is vascular flow in the left ovary.  Pulsed Doppler evaluation demonstrates normal low-resistance arterial and venous waveforms in both ovaries.  IMPRESSION: Normal examination.  IUD is present.  No sonographic evidence for ovarian torsion.   Original Report Authenticated By: Richarda Overlie, M.D.    US Pelvis Complete  02/09/2013  *RADIOLOGY REPORT*  Clinical Data:  Right lower quadrant pain.  TRANSABDOMINAL AND TRANSVAGINAL ULTRASOUND OF PELVIS DOPPLER ULTRASOUND OF OVARIES  Technique:  Both transabdominal and transvaginal ultrasound examinations of the pelvis were performed. Transabdominal technique was performed for global imaging of the pelvis including uterus, ovaries, adnexal regions, and pelvic cul-de-sac.  It was necessary to proceed with endovaginal exam following the transabdominal exam to visualize the uterus, endometrium and ovaries.  Color and duplex Doppler ultrasound was utilized to evaluate blood flow to the ovaries.  Comparison:  OB ultrasound 04/07/2012  Findings:  Uterus:  Uterus measures 5.7 x 2.9 x 5.8 cm.  There is a normal anteverted position without gross abnormality.  Endometrium:  An IUD is present.  As a result, endometrium is difficult to evaluate.  Position of IUD is grossly normal.  Right ovary:  Right ovary measures 1.6 x 3.3 x 1.8 cm.  Small follicles in the right ovary.  Vascular flow is identified in the right ovary.  Left ovary:   The left ovary measures 2.3 x 2.2 x 1.0 cm.  There are small follicles within the left ovary.  There is vascular flow in the left ovary.  Pulsed Doppler evaluation demonstrates normal low-resistance arterial and venous waveforms in both ovaries.  IMPRESSION: Normal examination.  IUD is present.  No sonographic evidence for ovarian  torsion.   Original Report Authenticated By: Richarda Overlie, M.D.    Ct Abdomen Pelvis W Contrast  02/09/2013  *RADIOLOGY REPORT*  Clinical Data: Abdominal pain.  Nausea, vomiting and diarrhea.  CT ABDOMEN AND PELVIS WITH CONTRAST  Technique:  Multidetector CT imaging of the abdomen and pelvis was performed following the standard protocol during bolus administration of intravenous contrast.  Contrast: 80mL OMNIPAQUE IOHEXOL 300 MG/ML  SOLN  Comparison: No priors  Findings:  Lung Bases: 2 mm nodule in the anterior aspect of the right middle lobe (image 70 of series 3) is nonspecific, but statistically likely to be benign in this young patient.  Otherwise, unremarkable.  Abdomen/Pelvis:  The appearance of the liver, gallbladder, pancreas, bilateral adrenal glands and bilateral kidneys is unremarkable.  There is mild irregularity of the contour of the spleen anteriorly, which is nonspecific, but may suggest prior splenic trauma.  An IUD is in place within the uterus.  The uterus and ovaries are unremarkable in appearance.  No significant volume  of ascites.  No pneumoperitoneum.  No pathologic distension of small bowel.  No definite pathologic lymphadenopathy identified within the abdomen or pelvis.  A trace volume of free fluid the cul- de-sac is presumably physiologic in this young female patient. Urinary bladder is unremarkable in appearance.  The appendix is severely enlarged measuring up to 13 mm in diameter.  Notably, the appendix is surrounded by a large amount of inflammatory changes with some reactive lymphadenopathy in the ileocolic mesentery, and a trace volume of free fluid.  Musculoskeletal: There are no aggressive appearing lytic or blastic lesions noted in the visualized portions of the skeleton. 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  Bilateral pars defects are present at L5.  4 mm of anterolisthesis of L5 upon S1.  IMPRESSION: 1.  Findings, as above, compatible with severe acute appendicitis.  No definite findings to suggest appendiceal rupture at this time. Surgical consultation is highly recommended. 2.  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 3.  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 4.  2 mm nodule in the right middle lobe is almost certainly benign in this young patient. If the patient is at high risk for bronchogenic carcinoma, follow-up chest CT at 1 year is recommended.  If the patient is at low risk, no follow-up is needed.  This recommendation follows the consensus statement: Guidelines for Management of Small Pulmonary Nodules Detected on CT Scans:  A Statement from the Fleischner Society as published in Radiology 2005; 237:395-400.  Critical Value/emergent results were called by telephone at the time of interpretation on 02/09/2013 at 01:20 p.m. to Dr. Clarene Duke, who verbally acknowledged these results.   Original Report Authenticated By: Trudie Reed, M.D.    [place] Art/ven Flow Abd Pelv Doppler  02/09/2013  *RADIOLOGY REPORT*  Clinical Data:  Right lower quadrant pain.  TRANSABDOMINAL AND TRANSVAGINAL ULTRASOUND OF PELVIS DOPPLER ULTRASOUND OF OVARIES  Technique:  Both transabdominal and transvaginal ultrasound examinations of the pelvis were performed. Transabdominal technique was performed for global imaging of the pelvis including uterus, ovaries, adnexal regions, and pelvic cul-de-sac.  It was necessary to proceed with endovaginal exam following the transabdominal exam to visualize the uterus, endometrium and ovaries.  Color and duplex Doppler ultrasound was utilized to evaluate blood flow to the ovaries.  Comparison:  OB ultrasound 04/07/2012  Findings:  Uterus:  Uterus measures 5.7 x 2.9 x 5.8 cm.  There is a normal anteverted position without gross abnormality.  Endometrium:  An IUD is present.  As a result, endometrium is difficult to evaluate.  Position of IUD is grossly normal.  Right ovary:  Right ovary measures 1.6 x 3.3 x 1.8 cm.  Small follicles in the right  ovary.  Vascular flow is identified in the right ovary.  Left ovary:   The left ovary measures 2.3 x 2.2 x 1.0 cm.  There are small follicles within the left ovary.  There is vascular flow in the left ovary.  Pulsed Doppler evaluation demonstrates normal low-resistance arterial and venous waveforms in both ovaries.  IMPRESSION: Normal examination.  IUD is present.  No sonographic evidence for ovarian torsion.   Original Report Authenticated By: Richarda Overlie, M.D.     Review of Systems  Constitutional: Negative.  Negative for fever, chills and weight loss.  HENT: Negative.   Eyes: Negative.   Respiratory: Negative.   Cardiovascular: Negative.   Gastrointestinal: Positive for nausea, vomiting and abdominal pain. Negative for diarrhea, constipation, blood in stool and melena.  Genitourinary:  Negative.   Musculoskeletal: Positive for back pain.  Skin: Negative for itching and rash.  Neurological: Negative.   Endo/Heme/Allergies: Negative.   Psychiatric/Behavioral: Negative.     Blood pressure 105/71, pulse 75, temperature 97.8 F (36.6 C), temperature source Oral, resp. rate 16, SpO2 100.00%. Physical Exam  Constitutional: She is oriented to person, place, and time. She appears well-developed and well-nourished. No distress.  HENT:  Head: Normocephalic and atraumatic.  Nose: Nose normal.  Mouth/Throat: No oropharyngeal exudate.  Eyes: Conjunctivae and EOM are normal. Pupils are equal, round, and reactive to light. Right eye exhibits no discharge. Left eye exhibits no discharge. No scleral icterus.  Neck: Normal range of motion. Neck supple. No JVD present. No tracheal deviation present. No thyromegaly present.  Cardiovascular: Normal rate, regular rhythm, normal heart sounds and intact distal pulses.  Exam reveals no gallop.   No murmur heard. Respiratory: Effort normal and breath sounds normal. No respiratory distress. She has no wheezes. She has no rales. She exhibits no tenderness.  GI:  Soft. Bowel sounds are normal. She exhibits no distension and no mass. There is tenderness (tender all over but most severe in RLQ). There is no rebound and no guarding.  Musculoskeletal: She exhibits no edema and no tenderness.  Lymphadenopathy:    She has no cervical adenopathy.  Neurological: She is alert and oriented to person, place, and time. No cranial nerve deficit.  Skin: Skin is warm and dry. No rash noted. She is not diaphoretic. No erythema. No pallor.  Psychiatric: She has a normal mood and affect. Her behavior is normal. Judgment and thought content normal.     Assessment/Plan 1. Acute appendicitis 2. 9 months Postpartum, and breast feeding currently. 3. Benign appearing 2 mm nodule right below. (Nonsmoker) 4.Tarlov cyst Sacrum 5.Grade 1 Spondylolisthesis  Plan:  Going to the OR this afternoon.  Plan to place her on abx, and IV fluids.  Breast pump her milk and waste for now till off meds.    Laynie Espy 02/09/2013, 2:18 PM

## 2013-02-09 NOTE — Progress Notes (Signed)
Pt dentures and security key with pt at bedside

## 2013-02-09 NOTE — H&P (Signed)
Discussed lap possible open appy via translator app.  We discussed risks and will proceed.

## 2013-02-10 ENCOUNTER — Encounter (HOSPITAL_COMMUNITY): Payer: Self-pay | Admitting: General Surgery

## 2013-02-10 LAB — BASIC METABOLIC PANEL
CO2: 26 mEq/L (ref 19–32)
Calcium: 8.3 mg/dL — ABNORMAL LOW (ref 8.4–10.5)
Creatinine, Ser: 0.66 mg/dL (ref 0.50–1.10)
GFR calc Af Amer: >90 mL/min (ref >90)
GFR calc non Af Amer: >90 mL/min (ref >90)
Sodium: 138 mEq/L (ref 135–145)

## 2013-02-10 LAB — URINE CULTURE: Colony Count: NO GROWTH

## 2013-02-10 LAB — CBC
MCH: 31.3 pg (ref 26.0–34.0)
MCHC: 35.5 g/dL (ref 30.0–36.0)
MCV: 88.2 fL (ref 78.0–100.0)
Platelets: 222 10*3/uL (ref 150–400)
RBC: 3.8 MIL/uL — ABNORMAL LOW (ref 3.87–5.11)
RDW: 13.7 % (ref 11.5–15.5)

## 2013-02-10 LAB — GC/CHLAMYDIA PROBE AMP: CT Probe RNA: NEGATIVE

## 2013-02-10 NOTE — Progress Notes (Signed)
Looks well, wbc mildly elevated, will give 24 hours abx in hospital and dc tomorrow on total 5 days.

## 2013-02-10 NOTE — Progress Notes (Signed)
1 Day Post-Op  Subjective: Still very sore, has not been out of bed.  Clears on tray for breakfast.  Objective: Vital signs in last 24 hours: Temp:  [97.2 F (36.2 C)-98.8 F (37.1 C)] 98.8 F (37.1 C) (04/11 0549) Pulse Rate:  [53-81] 72 (04/11 0549) Resp:  [12-18] 17 (04/11 0549) BP: (91-140)/(55-85) 91/55 mmHg (04/11 0549) SpO2:  [99 %-100 %] 100 % (04/11 0549) Diet: regular, afebrile, VSS, WBC down to 12.4    Intake/Output from previous day: 04/10 0701 - 04/11 0700 In: 1661.3 [I.V.:1561.3; IV Piggyback:100] Out: 525 [Urine:500; Blood:25] Intake/Output this shift:    General appearance: alert, cooperative and no distress GI: soft, but still very tender RLQ.  +BS. Incisions look fine.  Lab Results:   Recent Labs  02/09/13 0826 02/10/13 0510  WBC 15.0* 12.4*  HGB 13.9 11.9*  HCT 39.3 33.5*  PLT 280 222    BMET  Recent Labs  02/09/13 0826 02/10/13 0510  NA 137 138  K 4.0 3.7  CL 102 104  CO2 25 26  GLUCOSE 134* 110*  BUN 26* 15  CREATININE 0.69 0.66  CALCIUM 9.4 8.3*   PT/INR No results found for this basename: LABPROT, INR,  in the last 72 hours   Recent Labs Lab 02/09/13 0826  AST 33  ALT 36*  ALKPHOS 97  BILITOT 0.5  PROT 8.2  ALBUMIN 4.4     Lipase     Component Value Date/Time   LIPASE 24 02/09/2013 0826     Studies/Results: US Transvaginal Non-ob  02/09/2013  *RADIOLOGY REPORT*  Clinical Data:  Right lower quadrant pain.  TRANSABDOMINAL AND TRANSVAGINAL ULTRASOUND OF PELVIS DOPPLER ULTRASOUND OF OVARIES  Technique:  Both transabdominal and transvaginal ultrasound examinations of the pelvis were performed. Transabdominal technique was performed for global imaging of the pelvis including uterus, ovaries, adnexal regions, and pelvic cul-de-sac.  It was necessary to proceed with endovaginal exam following the transabdominal exam to visualize the uterus, endometrium and ovaries.  Color and duplex Doppler ultrasound was utilized to evaluate  blood flow to the ovaries.  Comparison:  OB ultrasound 04/07/2012  Findings:  Uterus:  Uterus measures 5.7 x 2.9 x 5.8 cm.  There is a normal anteverted position without gross abnormality.  Endometrium:  An IUD is present.  As a result, endometrium is difficult to evaluate.  Position of IUD is grossly normal.  Right ovary:  Right ovary measures 1.6 x 3.3 x 1.8 cm.  Small follicles in the right ovary.  Vascular flow is identified in the right ovary.  Left ovary:   The left ovary measures 2.3 x 2.2 x 1.0 cm.  There are small follicles within the left ovary.  There is vascular flow in the left ovary.  Pulsed Doppler evaluation demonstrates normal low-resistance arterial and venous waveforms in both ovaries.  IMPRESSION: Normal examination.  IUD is present.  No sonographic evidence for ovarian torsion.   Original Report Authenticated By: Richarda Overlie, M.D.    US Pelvis Complete  02/09/2013  *RADIOLOGY REPORT*  Clinical Data:  Right lower quadrant pain.  TRANSABDOMINAL AND TRANSVAGINAL ULTRASOUND OF PELVIS DOPPLER ULTRASOUND OF OVARIES  Technique:  Both transabdominal and transvaginal ultrasound examinations of the pelvis were performed. Transabdominal technique was performed for global imaging of the pelvis including uterus, ovaries, adnexal regions, and pelvic cul-de-sac.  It was necessary to proceed with endovaginal exam following the transabdominal exam to visualize the uterus, endometrium and ovaries.  Color and duplex Doppler ultrasound was utilized to  evaluate blood flow to the ovaries.  Comparison:  OB ultrasound 04/07/2012  Findings:  Uterus:  Uterus measures 5.7 x 2.9 x 5.8 cm.  There is a normal anteverted position without gross abnormality.  Endometrium:  An IUD is present.  As a result, endometrium is difficult to evaluate.  Position of IUD is grossly normal.  Right ovary:  Right ovary measures 1.6 x 3.3 x 1.8 cm.  Small follicles in the right ovary.  Vascular flow is identified in the right ovary.  Left  ovary:   The left ovary measures 2.3 x 2.2 x 1.0 cm.  There are small follicles within the left ovary.  There is vascular flow in the left ovary.  Pulsed Doppler evaluation demonstrates normal low-resistance arterial and venous waveforms in both ovaries.  IMPRESSION: Normal examination.  IUD is present.  No sonographic evidence for ovarian torsion.   Original Report Authenticated By: Richarda Overlie, M.D.    Ct Abdomen Pelvis W Contrast  02/09/2013  *RADIOLOGY REPORT*  Clinical Data: Abdominal pain.  Nausea, vomiting and diarrhea.  CT ABDOMEN AND PELVIS WITH CONTRAST  Technique:  Multidetector CT imaging of the abdomen and pelvis was performed following the standard protocol during bolus administration of intravenous contrast.  Contrast: 80mL OMNIPAQUE IOHEXOL 300 MG/ML  SOLN  Comparison: No priors  Findings:  Lung Bases: 2 mm nodule in the anterior aspect of the right middle lobe (image 70 of series 3) is nonspecific, but statistically likely to be benign in this young patient.  Otherwise, unremarkable.  Abdomen/Pelvis:  The appearance of the liver, gallbladder, pancreas, bilateral adrenal glands and bilateral kidneys is unremarkable.  There is mild irregularity of the contour of the spleen anteriorly, which is nonspecific, but may suggest prior splenic trauma.  An IUD is in place within the uterus.  The uterus and ovaries are unremarkable in appearance.  No significant volume of ascites.  No pneumoperitoneum.  No pathologic distension of small bowel.  No definite pathologic lymphadenopathy identified within the abdomen or pelvis.  A trace volume of free fluid the cul- de-sac is presumably physiologic in this young female patient. Urinary bladder is unremarkable in appearance.  The appendix is severely enlarged measuring up to 13 mm in diameter.  Notably, the appendix is surrounded by a large amount of inflammatory changes with some reactive lymphadenopathy in the ileocolic mesentery, and a trace volume of free fluid.   Musculoskeletal: There are no aggressive appearing lytic or blastic lesions noted in the visualized portions of the skeleton. 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  Bilateral pars defects are present at L5.  4 mm of anterolisthesis of L5 upon S1.  IMPRESSION: 1.  Findings, as above, compatible with severe acute appendicitis. No definite findings to suggest appendiceal rupture at this time. Surgical consultation is highly recommended. 2.  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 3.  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 4.  2 mm nodule in the right middle lobe is almost certainly benign in this young patient. If the patient is at high risk for bronchogenic carcinoma, follow-up chest CT at 1 year is recommended.  If the patient is at low risk, no follow-up is needed.  This recommendation follows the consensus statement: Guidelines for Management of Small Pulmonary Nodules Detected on CT Scans:  A Statement from the Fleischner Society as published in Radiology 2005; 237:395-400.  Critical Value/emergent results were called by telephone at the time of interpretation on 02/09/2013 at 01:20 p.m. to  Dr. Clarene Duke, who verbally acknowledged these results.   Original Report Authenticated By: Trudie Reed, M.D.    [place] Art/ven Flow Abd Pelv Doppler  02/09/2013  *RADIOLOGY REPORT*  Clinical Data:  Right lower quadrant pain.  TRANSABDOMINAL AND TRANSVAGINAL ULTRASOUND OF PELVIS DOPPLER ULTRASOUND OF OVARIES  Technique:  Both transabdominal and transvaginal ultrasound examinations of the pelvis were performed. Transabdominal technique was performed for global imaging of the pelvis including uterus, ovaries, adnexal regions, and pelvic cul-de-sac.  It was necessary to proceed with endovaginal exam following the transabdominal exam to visualize the uterus, endometrium and ovaries.  Color and duplex Doppler ultrasound was utilized to evaluate blood flow to the ovaries.  Comparison:  OB ultrasound  04/07/2012  Findings:  Uterus:  Uterus measures 5.7 x 2.9 x 5.8 cm.  There is a normal anteverted position without gross abnormality.  Endometrium:  An IUD is present.  As a result, endometrium is difficult to evaluate.  Position of IUD is grossly normal.  Right ovary:  Right ovary measures 1.6 x 3.3 x 1.8 cm.  Small follicles in the right ovary.  Vascular flow is identified in the right ovary.  Left ovary:   The left ovary measures 2.3 x 2.2 x 1.0 cm.  There are small follicles within the left ovary.  There is vascular flow in the left ovary.  Pulsed Doppler evaluation demonstrates normal low-resistance arterial and venous waveforms in both ovaries.  IMPRESSION: Normal examination.  IUD is present.  No sonographic evidence for ovarian torsion.   Original Report Authenticated By: Richarda Overlie, M.D.     Medications:    Assessment/Plan 1.Acute suppurative appendicitis; s/p Laparoscopic appendectomy, 02/09/2013,  Emelia Loron, MD. 2. 9 months Postpartum, and breast feeding currently.  3. Benign appearing 2 mm nodule right below. (Nonsmoker)  4.Tarlov cyst Sacrum  5.Grade 1 Spondylolisthesis   Plan:  Mobilize and see how she does, may be able to go home later today.   LOS: 1 day    Seeley Southgate 02/10/2013

## 2013-02-11 ENCOUNTER — Other Ambulatory Visit (INDEPENDENT_AMBULATORY_CARE_PROVIDER_SITE_OTHER): Payer: Self-pay | Admitting: General Surgery

## 2013-02-11 LAB — CBC
MCV: 90.1 fL (ref 78.0–100.0)
Platelets: 210 10*3/uL (ref 150–400)
RBC: 3.84 MIL/uL — ABNORMAL LOW (ref 3.87–5.11)
RDW: 13.8 % (ref 11.5–15.5)
WBC: 8.7 10*3/uL (ref 4.0–10.5)

## 2013-02-11 MED ORDER — AMOXICILLIN-POT CLAVULANATE 875-125 MG PO TABS: 1.0000 | ORAL_TABLET | Freq: Two times a day (BID) | ORAL | Status: AC

## 2013-02-11 MED ORDER — HYDROCODONE-ACETAMINOPHEN 5-325 MG PO TABS: 1.0000 | ORAL_TABLET | ORAL | Status: DC | PRN

## 2013-02-11 MED ORDER — AMOXICILLIN-POT CLAVULANATE 875-125 MG PO TABS: 1.0000 | ORAL_TABLET | Freq: Two times a day (BID) | ORAL | Status: DC

## 2013-02-11 MED ORDER — OXYCODONE HCL 5 MG PO TABS: 5.0000 mg | ORAL_TABLET | ORAL | Status: DC | PRN

## 2013-02-11 NOTE — Progress Notes (Addendum)
Discharge instructions gone over with patient. Home medications gone over. Prescriptions given. Diet, activity, and when to call the doctor gone over. Follow up appointment to be made. Signs and symptoms of infection, and incisional care gone over. Valuables returned to patient. Patient told to continue to discard breast milk while on antibiotics and narcotic pain medicine. Patient verbalized understanding of instructions. My chart instructions gone over with patient. Also patient stated her ride will not be available till around 2:30pm.

## 2013-02-11 NOTE — Progress Notes (Signed)
2 Days Post-Op  Subjective: Feeling better.  Tolerating diet.  Objective: Vital signs in last 24 hours: Temp:  [98.1 F (36.7 C)-99 F (37.2 C)] 98.1 F (36.7 C) (04/12 0516) Pulse Rate:  [70-88] 70 (04/12 0516) Resp:  [16-17] 17 (04/12 0516) BP: (98-110)/(56-72) 107/72 mmHg (04/12 0516) SpO2:  [97 %-100 %] 100 % (04/12 0516) Weight:  [154 lb 5.2 oz (70 kg)] 154 lb 5.2 oz (70 kg) (04/11 1356) Last BM Date:  (PTA)  Intake/Output from previous day: 04/11 0701 - 04/12 0700 In: 2760 [P.O.:960; I.V.:1800] Out: 400 [Urine:400] Intake/Output this shift: Total I/O In: 240 [P.O.:240] Out: -   General appearance: alert, cooperative and no distress Resp: nonlabored Cardio: normal rate, regular GI: soft, mild incisional and RLQ tenderness, ND, wounds okay, no peritoneal signs  Lab Results:   Recent Labs  02/10/13 0510 02/11/13 0722  WBC 12.4* 8.7  HGB 11.9* 11.8*  HCT 33.5* 34.6*  PLT 222 210   BMET  Recent Labs  02/09/13 0826 02/10/13 0510  NA 137 138  K 4.0 3.7  CL 102 104  CO2 25 26  GLUCOSE 134* 110*  BUN 26* 15  CREATININE 0.69 0.66  CALCIUM 9.4 8.3*   PT/INR No results found for this basename: LABPROT, INR,  in the last 72 hours ABG No results found for this basename: PHART, PCO2, PO2, HCO3,  in the last 72 hours  Studies/Results: US Transvaginal Non-ob  02/09/2013  *RADIOLOGY REPORT*  Clinical Data:  Right lower quadrant pain.  TRANSABDOMINAL AND TRANSVAGINAL ULTRASOUND OF PELVIS DOPPLER ULTRASOUND OF OVARIES  Technique:  Both transabdominal and transvaginal ultrasound examinations of the pelvis were performed. Transabdominal technique was performed for global imaging of the pelvis including uterus, ovaries, adnexal regions, and pelvic cul-de-sac.  It was necessary to proceed with endovaginal exam following the transabdominal exam to visualize the uterus, endometrium and ovaries.  Color and duplex Doppler ultrasound was utilized to evaluate blood flow to the  ovaries.  Comparison:  OB ultrasound 04/07/2012  Findings:  Uterus:  Uterus measures 5.7 x 2.9 x 5.8 cm.  There is a normal anteverted position without gross abnormality.  Endometrium:  An IUD is present.  As a result, endometrium is difficult to evaluate.  Position of IUD is grossly normal.  Right ovary:  Right ovary measures 1.6 x 3.3 x 1.8 cm.  Small follicles in the right ovary.  Vascular flow is identified in the right ovary.  Left ovary:   The left ovary measures 2.3 x 2.2 x 1.0 cm.  There are small follicles within the left ovary.  There is vascular flow in the left ovary.  Pulsed Doppler evaluation demonstrates normal low-resistance arterial and venous waveforms in both ovaries.  IMPRESSION: Normal examination.  IUD is present.  No sonographic evidence for ovarian torsion.   Original Report Authenticated By: Richarda Overlie, M.D.    US Pelvis Complete  02/09/2013  *RADIOLOGY REPORT*  Clinical Data:  Right lower quadrant pain.  TRANSABDOMINAL AND TRANSVAGINAL ULTRASOUND OF PELVIS DOPPLER ULTRASOUND OF OVARIES  Technique:  Both transabdominal and transvaginal ultrasound examinations of the pelvis were performed. Transabdominal technique was performed for global imaging of the pelvis including uterus, ovaries, adnexal regions, and pelvic cul-de-sac.  It was necessary to proceed with endovaginal exam following the transabdominal exam to visualize the uterus, endometrium and ovaries.  Color and duplex Doppler ultrasound was utilized to evaluate blood flow to the ovaries.  Comparison:  OB ultrasound 04/07/2012  Findings:  Uterus:  Uterus  measures 5.7 x 2.9 x 5.8 cm.  There is a normal anteverted position without gross abnormality.  Endometrium:  An IUD is present.  As a result, endometrium is difficult to evaluate.  Position of IUD is grossly normal.  Right ovary:  Right ovary measures 1.6 x 3.3 x 1.8 cm.  Small follicles in the right ovary.  Vascular flow is identified in the right ovary.  Left ovary:   The left  ovary measures 2.3 x 2.2 x 1.0 cm.  There are small follicles within the left ovary.  There is vascular flow in the left ovary.  Pulsed Doppler evaluation demonstrates normal low-resistance arterial and venous waveforms in both ovaries.  IMPRESSION: Normal examination.  IUD is present.  No sonographic evidence for ovarian torsion.   Original Report Authenticated By: Richarda Overlie, M.D.    Ct Abdomen Pelvis W Contrast  02/09/2013  *RADIOLOGY REPORT*  Clinical Data: Abdominal pain.  Nausea, vomiting and diarrhea.  CT ABDOMEN AND PELVIS WITH CONTRAST  Technique:  Multidetector CT imaging of the abdomen and pelvis was performed following the standard protocol during bolus administration of intravenous contrast.  Contrast: 80mL OMNIPAQUE IOHEXOL 300 MG/ML  SOLN  Comparison: No priors  Findings:  Lung Bases: 2 mm nodule in the anterior aspect of the right middle lobe (image 70 of series 3) is nonspecific, but statistically likely to be benign in this young patient.  Otherwise, unremarkable.  Abdomen/Pelvis:  The appearance of the liver, gallbladder, pancreas, bilateral adrenal glands and bilateral kidneys is unremarkable.  There is mild irregularity of the contour of the spleen anteriorly, which is nonspecific, but may suggest prior splenic trauma.  An IUD is in place within the uterus.  The uterus and ovaries are unremarkable in appearance.  No significant volume of ascites.  No pneumoperitoneum.  No pathologic distension of small bowel.  No definite pathologic lymphadenopathy identified within the abdomen or pelvis.  A trace volume of free fluid the cul- de-sac is presumably physiologic in this young female patient. Urinary bladder is unremarkable in appearance.  The appendix is severely enlarged measuring up to 13 mm in diameter.  Notably, the appendix is surrounded by a large amount of inflammatory changes with some reactive lymphadenopathy in the ileocolic mesentery, and a trace volume of free fluid.  Musculoskeletal:  There are no aggressive appearing lytic or blastic lesions noted in the visualized portions of the skeleton. 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  Bilateral pars defects are present at L5.  4 mm of anterolisthesis of L5 upon S1.  IMPRESSION: 1.  Findings, as above, compatible with severe acute appendicitis. No definite findings to suggest appendiceal rupture at this time. Surgical consultation is highly recommended. 2.  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 3.  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 4.  2 mm nodule in the right middle lobe is almost certainly benign in this young patient. If the patient is at high risk for bronchogenic carcinoma, follow-up chest CT at 1 year is recommended.  If the patient is at low risk, no follow-up is needed.  This recommendation follows the consensus statement: Guidelines for Management of Small Pulmonary Nodules Detected on CT Scans:  A Statement from the Fleischner Society as published in Radiology 2005; 237:395-400.  Critical Value/emergent results were called by telephone at the time of interpretation on 02/09/2013 at 01:20 p.m. to Dr. Clarene Duke, who verbally acknowledged these results.   Original Report Authenticated By: Trudie Reed, M.D.  [place] Art/ven Flow Abd Pelv Doppler  02/09/2013  *RADIOLOGY REPORT*  Clinical Data:  Right lower quadrant pain.  TRANSABDOMINAL AND TRANSVAGINAL ULTRASOUND OF PELVIS DOPPLER ULTRASOUND OF OVARIES  Technique:  Both transabdominal and transvaginal ultrasound examinations of the pelvis were performed. Transabdominal technique was performed for global imaging of the pelvis including uterus, ovaries, adnexal regions, and pelvic cul-de-sac.  It was necessary to proceed with endovaginal exam following the transabdominal exam to visualize the uterus, endometrium and ovaries.  Color and duplex Doppler ultrasound was utilized to evaluate blood flow to the ovaries.  Comparison:  OB ultrasound 04/07/2012   Findings:  Uterus:  Uterus measures 5.7 x 2.9 x 5.8 cm.  There is a normal anteverted position without gross abnormality.  Endometrium:  An IUD is present.  As a result, endometrium is difficult to evaluate.  Position of IUD is grossly normal.  Right ovary:  Right ovary measures 1.6 x 3.3 x 1.8 cm.  Small follicles in the right ovary.  Vascular flow is identified in the right ovary.  Left ovary:   The left ovary measures 2.3 x 2.2 x 1.0 cm.  There are small follicles within the left ovary.  There is vascular flow in the left ovary.  Pulsed Doppler evaluation demonstrates normal low-resistance arterial and venous waveforms in both ovaries.  IMPRESSION: Normal examination.  IUD is present.  No sonographic evidence for ovarian torsion.   Original Report Authenticated By: Richarda Overlie, M.D.     Anti-infectives: Anti-infectives   Start     Dose/Rate Route Frequency Ordered Stop   02/09/13 1500  ertapenem (INVANZ) 1 g in sodium chloride 0.9 % 50 mL IVPB  Status:  Discontinued     1 g 100 mL/hr over 30 Minutes Intravenous Every 24 hours 02/09/13 1417 02/09/13 1929   02/09/13 1400  ertapenem (INVANZ) 1 g in sodium chloride 0.9 % 50 mL IVPB  Status:  Discontinued     1 g 100 mL/hr over 30 Minutes Intravenous  Once 02/09/13 1327 02/09/13 1929      Assessment/Plan: s/p Procedure(s): APPENDECTOMY LAPAROSCOPIC (N/A) she looks okay and tolerating diet. she should be okay for discharge to home later today.  LOS: 2 days    Lodema Pilot DAVID 02/11/2013

## 2013-02-12 NOTE — Progress Notes (Signed)
   CARE MANAGEMENT NOTE 02/12/2013  Patient:  Carrie Buchanan, Carrie Buchanan   Account Number:  [id number]  Date Initiated:  02/12/2013  Documentation initiated by:  [facility]  Subjective/Objective Assessment:     Action/Plan:   medication needs, no insurance   Anticipated DC Date:  02/11/2013   Anticipated DC Plan:  HOME/SELF CARE      DC Planning Services  CM consult  Medication Assistance      Choice offered to / List presented to:             Status of service:  Completed, signed off Medicare Important Message given?   (If response is "NO", the following Medicare IM given date fields will be blank) Date Medicare IM given:   Date Additional Medicare IM given:    Discharge Disposition:  HOME/SELF CARE  Per UR Regulation:    If discussed at Long Length of Stay Meetings, dates discussed:    Comments:  02/11/2013 1300 No NCM needs identified. No consult called to NCM on weekend for meds assistance. Isidoro Donning RN CCM Case Mgmt phone [phone]

## 2013-02-20 ENCOUNTER — Encounter (HOSPITAL_COMMUNITY): Payer: Self-pay | Admitting: General Surgery

## 2013-02-20 DIAGNOSIS — K3533 Acute appendicitis with perforation and localized peritonitis, with abscess: Secondary | ICD-10-CM

## 2013-02-20 HISTORY — DX: Acute appendicitis with perforation, localized peritonitis, and gangrene, with abscess: K35.33

## 2013-02-20 NOTE — Discharge Summary (Signed)
Physician Discharge Summary  Patient ID: Carrie Buchanan MRN: [id number] DOB/AGE: 1976/02/24 37 y.o.  Admit date: 02/09/2013 Discharge date: 02/11/2013  Admission Diagnoses: 1.Acute suppurative appendicitis; s/p Laparoscopic appendectomy, 02/09/2013, Emelia Loron, MD.  2. 9 months Postpartum, and breast feeding currently.  3. Benign appearing 2 mm nodule right below. (Nonsmoker)  4.Tarlov cyst Sacrum  5.Grade 1 Spondylolisthesis      Discharge Diagnoses: Same Active Problems:   Appendicitis, acute, with peritonitis   PROCEDURES:  s/p Laparoscopic appendectomy, 02/09/2013, Emelia Loron, MD.    Hospital Course: This is a 37 year old female with a several day history of abdominal pain. On her CT scan she has evidence of appendicitis. On her clinical examination she is tender in the right lower quadrant as well. I discussed with her via a translator proceeding with a laparoscopic appendectomy. She was admitted and taken to the OR on 02/09/13. She improved over the next 48 hours.  She was discharged home on after being seen by Dr. Biagio Quint.    Disposition: 01-Home or Self Care      Discharge Orders   Future Appointments Provider Department Dept Phone   03/07/2013 11:00 AM [facility] [place] [phone]   Future Orders Complete By Expires     Call MD for:  difficulty breathing, headache or visual disturbances  As directed     Call MD for:  persistant nausea and vomiting  As directed     Call MD for:  redness, tenderness, or signs of infection (pain, swelling, redness, odor or green/yellow discharge around incision site)  As directed     Call MD for:  severe uncontrolled pain  As directed     Call MD for:  temperature >100.4  As directed     Diet - low sodium heart healthy  As directed     Discharge instructions  As directed     Comments:      May shower tomorrow. Call [phone] for follow up with Dr. Dwain Sarna May pump the breast milk and  discard until off all of the pain medications and antibiotics    Increase activity slowly  As directed         Medication List    TAKE these medications       amoxicillin-clavulanate 875-125 MG per tablet  Commonly known as:  AUGMENTIN  Take 1 tablet by mouth 2 (two) times daily.     multivitamin with minerals tablet  Take 1 tablet by mouth daily.         SignedSherrie George 02/20/2013, 1:01 PM

## 2013-03-07 ENCOUNTER — Encounter (INDEPENDENT_AMBULATORY_CARE_PROVIDER_SITE_OTHER): Payer: Self-pay | Admitting: Internal Medicine

## 2013-03-07 ENCOUNTER — Ambulatory Visit (INDEPENDENT_AMBULATORY_CARE_PROVIDER_SITE_OTHER): Payer: Medicaid Other | Admitting: Internal Medicine

## 2013-03-07 VITALS — BP 148/78 | HR 76 | Temp 97.3°F | Resp 16 | Ht 59.5 in | Wt 144.0 lb

## 2013-03-07 DIAGNOSIS — K3580 Unspecified acute appendicitis: Secondary | ICD-10-CM

## 2013-03-07 NOTE — Progress Notes (Signed)
  Subjective: Pt returns to the clinic today after undergoing laparoscopic appendectomy on 02/09/13.  The patient is tolerating their diet well and is having no severe pain.  Bowel function is good.  No problems with the wounds.  Objective: Vital signs in last 24 hours: Reviewed  PE: Abd: soft, non-tender, +bs, incisions well healed  Lab Results:  No results found for this basename: WBC, HGB, HCT, PLT,  in the last 72 hours BMET No results found for this basename: NA, K, CL, CO2, GLUCOSE, BUN, CREATININE, CALCIUM,  in the last 72 hours PT/INR No results found for this basename: LABPROT, INR,  in the last 72 hours CMP     Component Value Date/Time   NA 138 02/10/2013 0510   K 3.7 02/10/2013 0510   CL 104 02/10/2013 0510   CO2 26 02/10/2013 0510   GLUCOSE 110* 02/10/2013 0510   BUN 15 02/10/2013 0510   CREATININE 0.66 02/10/2013 0510   CALCIUM 8.3* 02/10/2013 0510   PROT 8.2 02/09/2013 0826   ALBUMIN 4.4 02/09/2013 0826   AST 33 02/09/2013 0826   ALT 36* 02/09/2013 0826   ALKPHOS 97 02/09/2013 0826   BILITOT 0.5 02/09/2013 0826   GFRNONAA >90 02/10/2013 0510   GFRAA >90 02/10/2013 0510   Lipase     Component Value Date/Time   LIPASE 24 02/09/2013 0826       Studies/Results: No results found.  Anti-infectives: Anti-infectives   None       Assessment/Plan  1.  S/P Laparoscopic Appendectomy: doing well, may resume regular activity without restrictions, Pt will follow up with [place] PRN and knows to call with questions or concerns.     Kaydan Wilhoite 03/07/2013

## 2013-03-07 NOTE — Patient Instructions (Signed)
May resume regular activity without restrictions. Follow up as needed. Call with questions or concerns.  

## 2014-01-05 IMAGING — US US OB FOLLOW-UP
2 series · 14 of 28 positions shown · non-contrast
Comparison: none

[Series 1: us ob follow up · 62 acquisitions, 12 frames shown (1 of 2)]
[im 3/62]
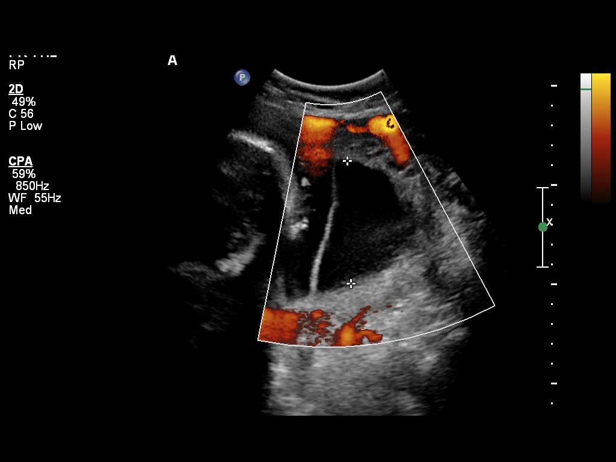
[im 8/62]
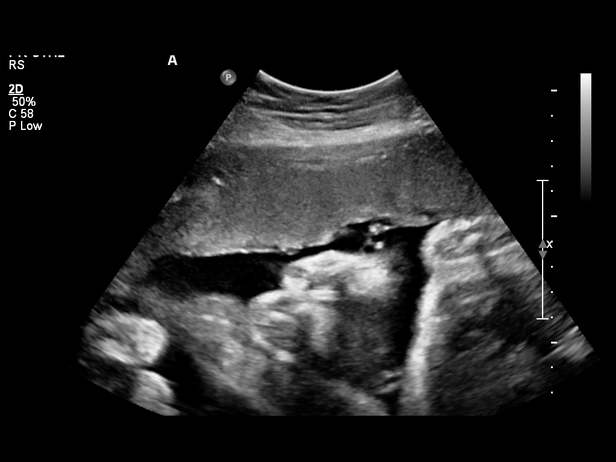
[im 14/62]
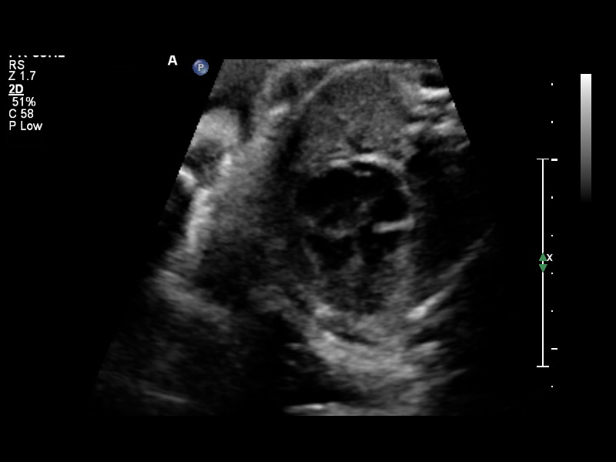
[im 19/62]
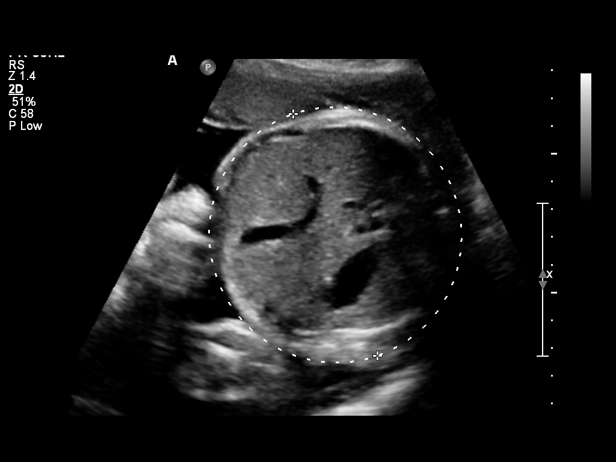
[im 24/62]
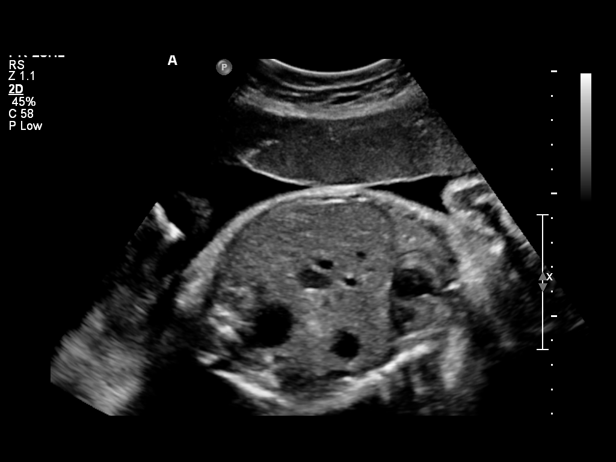
[im 30/62]
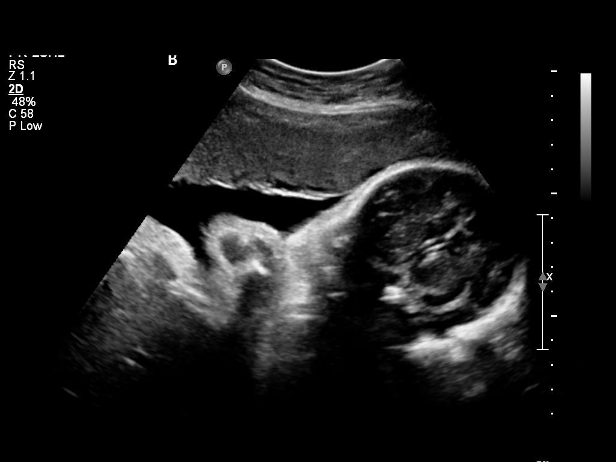
[im 35/62]
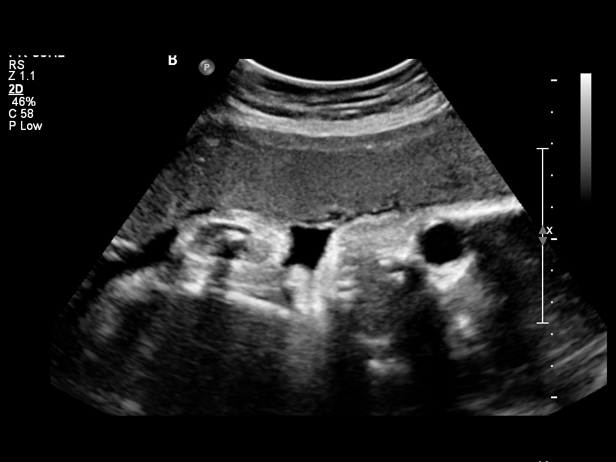
[im 40/62]
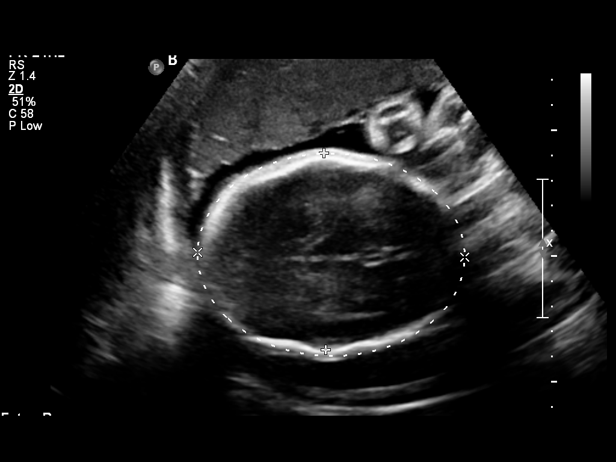
[im 46/62]
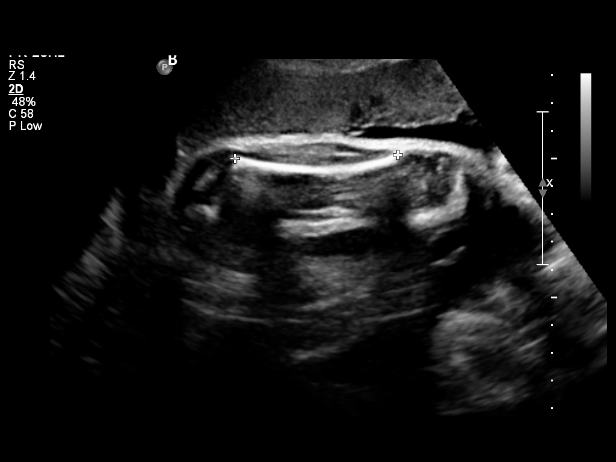
[im 51/62]
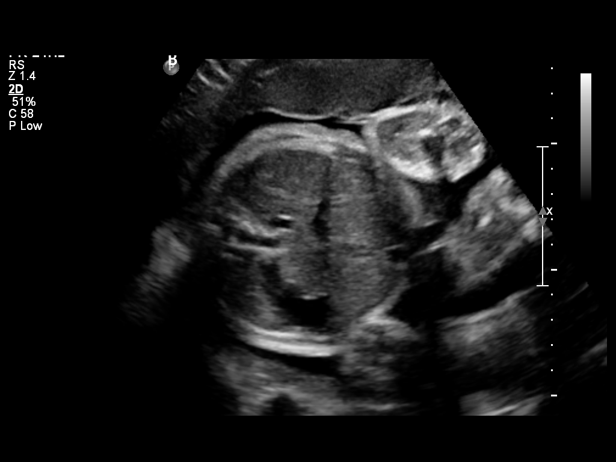
[im 56/62]
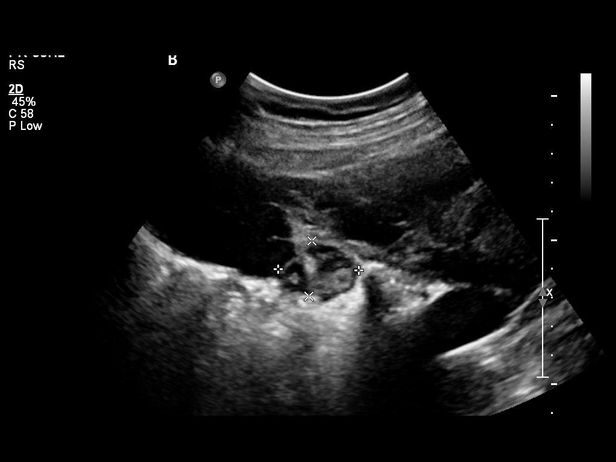
[im 62/62]
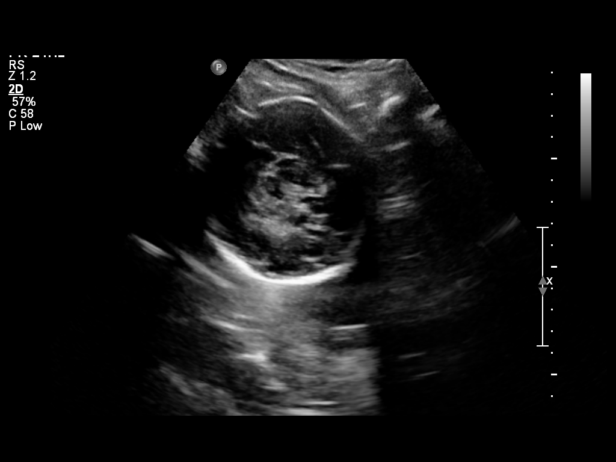

[Series 1: us ob follow up · 2 of 11 slices shown (2 of 2)]
[im 4/11]
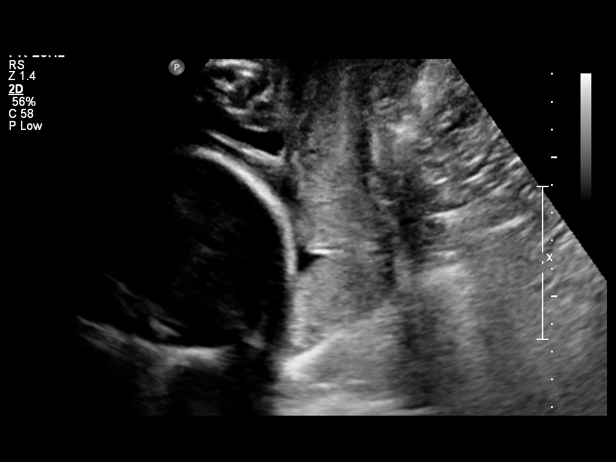
[im 11/11]
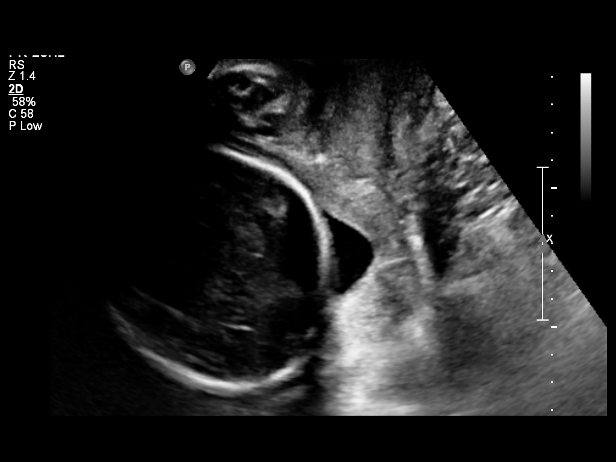

[14 of 28 positions shown; findings below may reference images not displayed]

OBSTETRICS REPORT
                      (Signed Final 03/04/2012 [DATE])

 Order#:         [id number]
                 93_O
Procedures

 US OB FOLLOW UP                                       76816.1
 US OB FOLLOW UP ADDL GEST                             76816.2
Indications

 Twin gestation, Di-Di
 Advanced maternal age (AMA), Multigravida
 Poor obstetrical history (previous SAB)
 Cervical insufficiency
 Assess Fetal Growth / Estimated Fetal Weight
Fetal Evaluation (Fetus A)

 Fetal Heart Rate:  135                         bpm
 Cardiac Activity:  Observed
 Fetal Lie:         Lower  Maternal left side
 Presentation:      Cephalic
 Placenta:          Anterior, above cervical os
 P. Cord            Previously Visualized
 Insertion:

 Amniotic Fluid
 AFI FV:      Subjectively within normal limits
                                             Larg Pckt:     6.2  cm
Biometry (Fetus A)

 BPD:     78.9  mm    G. Age:   31w 5d                CI:        72.39   70 - 86
                                                      FL/HC:      20.5   19.3 -

 HC:       295  mm    G. Age:   32w 4d       49  %    HC/AC:      1.04   0.96 -

 AC:     283.3  mm    G. Age:   32w 2d       77  %    FL/BPD:     76.8   71 - 87
 FL:      60.6  mm    G. Age:   31w 4d       42  %    FL/AC:      21.4   20 - 24
 Est. FW:    4853  gm      4 lb 3 oz     71  %     FW Discordancy      0 \ 4 %
Gestational Age (Fetus A)

 LMP:           31w 2d       Date:   07/29/11                 EDD:   05/04/12
 U/S Today:     32w 0d                                        EDD:   04/29/12
 Best:          31w 2d    Det. By:   LMP  (07/29/11)          EDD:   05/04/12
Anatomy (Fetus A)

 Cranium:           Appears normal      Aortic Arch:       Previously seen
 Fetal Cavum:       Previously seen     Ductal Arch:       Previously seen
 Ventricles:        Appears normal      Diaphragm:         Appears normal
 Choroid Plexus:    Previously seen     Stomach:           Appears
                                                           normal, left
                                                           sided
 Cerebellum:        Previously seen     Abdomen:           Previously seen
 Posterior Fossa:   Previously seen     Abdominal Wall:    Previously seen
 Nuchal Fold:       Not applicable      Cord Vessels:      Previously seen
                    (>20 wks GA)
 Face:              Lips and orbits     Kidneys:           Appear normal
                    previously seen
 Heart:             Appears normal      Bladder:           Appears normal
                    (4 chamber &
                    axis)
 RVOT:              Previously seen     Spine:             Previously seen
 LVOT:              Previously seen     Limbs:             Previously seen

 Other:     Male gender previously seen. Heels and Nasal bone
            previously seen.
Targeted Anatomy (Fetus A)

 Fetal Central Nervous System
 Lat. Ventricles:

Fetal Evaluation (Fetus B)

 Fetal Heart Rate:  135                         bpm
 Cardiac Activity:  Observed
 Fetal Lie:         Upper Fetus
 Presentation:      Transverse, head to
                    maternal left
 Placenta:          Anterior, above cervical os
 P. Cord            Previously Visualized
 Insertion:

 Amniotic Fluid
 AFI FV:      Subjectively within normal limits
                                             Larg Pckt:     6.3  cm
Biometry (Fetus B)

 BPD:     76.9  mm    G. Age:   30w 6d                CI:        68.77   70 - 86
                                                      FL/HC:      19.5   19.3 -

 HC:     296.3  mm    G. Age:   32w 5d       54  %    HC/AC:      1.04   0.96 -

 AC:     284.4  mm    G. Age:   32w 3d       79  %    FL/BPD:     75.0   71 - 87
 FL:      57.7  mm    G. Age:   30w 1d       12  %    FL/AC:      20.3   20 - 24
 Est. FW:    9894  gm          4 lb      63  %     FW Discordancy         4  %
Gestational Age (Fetus B)

 LMP:           31w 2d       Date:   07/29/11                 EDD:   05/04/12
 U/S Today:     31w 4d                                        EDD:   05/02/12
 Best:          31w 2d    Det. By:   LMP  (07/29/11)          EDD:   05/04/12
Anatomy (Fetus B)

 Cranium:           Appears normal      Aortic Arch:       Previously seen
 Fetal Cavum:       Previously seen     Ductal Arch:       Previously seen
 Ventricles:        Appears normal      Diaphragm:         Appears normal
 Choroid Plexus:    Previously seen     Stomach:           Appears
                                                           normal, left
                                                           sided
 Cerebellum:        Previously seen     Abdomen:           Previously seen
 Posterior Fossa:   Previously seen     Abdominal Wall:    Previously seen
 Nuchal Fold:       Not applicable      Cord Vessels:      Previously seen
                    (>20 wks GA)
 Face:              Previously seen     Kidneys:           Appear normal
 Heart:             Appears normal      Bladder:           Appears normal
                    (4 chamber &
                    axis)
 RVOT:              Previously seen     Spine:             Previously seen
 LVOT:              Previously seen     Limbs:             Previously seen

 Other:     Male gender previously seen.  Heels and Nasal bone
            previously seen.
Cervix Uterus Adnexa

 Cervical Length:   1.1       cm       Funnel Width:   4         cm
 Funnel Length:     2.1       cm

 Cervix:       Appears funnelled and dynamic. See comments.

 Left Ovary:   Not visualized.
 Right Ovary:  Within normal limits.
 Adnexa:     No abnormality visualized.
Comments

 These results were discussed with Dr. Yuriy and the
 patient was released to home as directed.
Impression

 Diamniotic, dichorionic twin gestation with twin A
 demonstrating an EGA by US of 32w 0d and twin B
 demonstrating an EGA by US of 31w 4d. Correlation with
 expected EGA by LMP of 31w 2d corresponds with
 appropriate and concordant growth. EFW is currently at the
 71% for twin A and 63% for twin B.

 No late developing anatomic abnormalities are seen
 associated with the four chamber heart, lateral ventricles,
 stomach, kidney or bladder for either twin.

 Subjectively and quantitatively normal amniotic fluid volume
 noted for both twins.

 Shortened cervical length with funneling as described above.
 Evalualed translabially

 FYTOS with us.  Please do not hesitate to

## 2014-01-11 ENCOUNTER — Ambulatory Visit: Payer: Medicaid Other | Attending: Internal Medicine

## 2014-01-29 ENCOUNTER — Emergency Department (HOSPITAL_COMMUNITY)
Admission: EM | Admit: 2014-01-29 | Discharge: 2014-01-29 | Discharge status: Against Medical Advice | Payer: No Typology Code available for payment source | Attending: Emergency Medicine | Admitting: Emergency Medicine

## 2014-01-29 ENCOUNTER — Encounter (HOSPITAL_COMMUNITY): Payer: Self-pay | Admitting: Emergency Medicine

## 2014-01-29 ENCOUNTER — Ambulatory Visit: Payer: No Typology Code available for payment source | Attending: Internal Medicine | Admitting: Internal Medicine

## 2014-01-29 VITALS — BP 134/86 | HR 64 | Temp 98.7°F | Ht 59.0 in | Wt 155.2 lb

## 2014-01-29 DIAGNOSIS — M25559 Pain in unspecified hip: Secondary | ICD-10-CM | POA: Insufficient documentation

## 2014-01-29 DIAGNOSIS — R911 Solitary pulmonary nodule: Secondary | ICD-10-CM

## 2014-01-29 DIAGNOSIS — IMO0002 Reserved for concepts with insufficient information to code with codable children: Secondary | ICD-10-CM | POA: Insufficient documentation

## 2014-01-29 DIAGNOSIS — M549 Dorsalgia, unspecified: Secondary | ICD-10-CM

## 2014-01-29 DIAGNOSIS — M545 Low back pain, unspecified: Secondary | ICD-10-CM | POA: Insufficient documentation

## 2014-01-29 DIAGNOSIS — S335XXA Sprain of ligaments of lumbar spine, initial encounter: Secondary | ICD-10-CM | POA: Insufficient documentation

## 2014-01-29 DIAGNOSIS — M25529 Pain in unspecified elbow: Secondary | ICD-10-CM | POA: Insufficient documentation

## 2014-01-29 DIAGNOSIS — X58XXXA Exposure to other specified factors, initial encounter: Secondary | ICD-10-CM | POA: Insufficient documentation

## 2014-01-29 LAB — CBC WITH DIFFERENTIAL/PLATELET
Basophils Absolute: 0 10*3/uL (ref 0.0–0.1)
Basophils Relative: 0 % (ref 0–1)
EOS ABS: 0.2 10*3/uL (ref 0.0–0.7)
EOS PCT: 2 % (ref 0–5)
HCT: 40.6 % (ref 36.0–46.0)
Hemoglobin: 13.8 g/dL (ref 12.0–15.0)
LYMPHS ABS: 3.2 10*3/uL (ref 0.7–4.0)
Lymphocytes Relative: 29 % (ref 12–46)
MCH: 30.7 pg (ref 26.0–34.0)
MCHC: 34 g/dL (ref 30.0–36.0)
MCV: 90.4 fL (ref 78.0–100.0)
Monocytes Absolute: 0.9 10*3/uL (ref 0.1–1.0)
Monocytes Relative: 8 % (ref 3–12)
Neutro Abs: 6.7 10*3/uL (ref 1.7–7.7)
Neutrophils Relative %: 61 % (ref 43–77)
PLATELETS: 393 10*3/uL (ref 150–400)
RBC: 4.49 MIL/uL (ref 3.87–5.11)
RDW: 13.7 % (ref 11.5–15.5)
WBC: 11 10*3/uL — ABNORMAL HIGH (ref 4.0–10.5)

## 2014-01-29 LAB — COMPLETE METABOLIC PANEL WITH GFR
ALT: 27 U/L (ref 0–35)
AST: 18 U/L (ref 0–37)
Albumin: 4.2 g/dL (ref 3.5–5.2)
Alkaline Phosphatase: 61 U/L (ref 39–117)
BILIRUBIN TOTAL: 0.5 mg/dL (ref 0.2–1.2)
BUN: 17 mg/dL (ref 6–23)
CO2: 28 meq/L (ref 19–32)
CREATININE: 0.7 mg/dL (ref 0.50–1.10)
Calcium: 9.7 mg/dL (ref 8.4–10.5)
Chloride: 101 mEq/L (ref 96–112)
GLUCOSE: 106 mg/dL — AB (ref 70–99)
Potassium: 4.9 mEq/L (ref 3.5–5.3)
Sodium: 137 mEq/L (ref 135–145)
Total Protein: 7.5 g/dL (ref 6.0–8.3)

## 2014-01-29 LAB — LIPID PANEL
CHOLESTEROL: 202 mg/dL — AB (ref 0–200)
HDL: 50 mg/dL (ref >39)
LDL Cholesterol: 102 mg/dL — ABNORMAL HIGH (ref 0–99)
Total CHOL/HDL Ratio: 4 Ratio
Triglycerides: 250 mg/dL — ABNORMAL HIGH (ref <150)
VLDL: 50 mg/dL — ABNORMAL HIGH (ref 0–40)

## 2014-01-29 LAB — TSH: TSH: 2.65 u[IU]/mL (ref 0.350–4.500)

## 2014-01-29 MED ORDER — CYCLOBENZAPRINE HCL 10 MG PO TABS: 10.0000 mg | ORAL_TABLET | Freq: Every day | ORAL | Status: DC

## 2014-01-29 MED ORDER — OMEPRAZOLE 20 MG PO CPDR: 20.0000 mg | DELAYED_RELEASE_CAPSULE | Freq: Every day | ORAL | Status: DC

## 2014-01-29 MED ORDER — PREDNISONE 20 MG PO TABS: 20.0000 mg | ORAL_TABLET | Freq: Every day | ORAL | Status: DC

## 2014-01-29 MED ORDER — MELOXICAM 15 MG PO TABS: 15.0000 mg | ORAL_TABLET | Freq: Every day | ORAL | Status: DC

## 2014-01-29 NOTE — ED Notes (Signed)
Per interpretor phones pt having lumbar pain and right hip pain. When looking at her chart I noticed [facility] When asking the pt about it sts she was sent here for CT scan. Per Dr. Phoebe SharpsNote sts sent here for CT of back and hip. Pt denies any N,V,D. Denies chest pain, SOB. Denies vaginal symptoms, abdominal pain or urinary complaints.

## 2014-01-29 NOTE — Progress Notes (Signed)
Patient is here to establish care. She is having hip pain x9 days 7/10 right now. Arm pain 7/10 right now x3 months

## 2014-01-29 NOTE — Progress Notes (Signed)
Patient ID: Carrie Buchanan, female   DOB: 31-Jan-1976, 38 y.o.   MRN: [id number]   CC:  HPI:  38 year old female comes in to establish care. Primary complaint is right ear pain, low back and right SI joint pain and left elbow pain. The hip pain has been present for 6 years, the left SI joint pain in the lumbar spinal pain started 2 years ago after the delivery of foot and for which she received an epidural injection. Patient had a CT scan last year, that showed grade 1 spondylolisthesis of L5 upon S1. She does complain of some numbness going down her right leg. Pain is usually aggravated by household chores. Left elbow pain is exacerbated when she lifts something heavy. The patient has been taking a [place] anti-inflammatory medication with some relief   Usually stays constipated for the urinary or stool incontinence. Patient also 2 mm pulmonary nodule, is a lifetime nonsmoker.  Pap smear January 2015    No Known Allergies Past Medical History  Diagnosis Date  . Medical history non-contributory   . Appendicitis, acute, with peritonitis 02/20/2013   Current Outpatient Prescriptions on File Prior to Visit  Medication Sig Dispense Refill  . Ascorbic Acid (VITAMIN C) 100 MG tablet Take 100 mg by mouth daily.      . Multiple Vitamins-Minerals (MULTIVITAMIN WITH MINERALS) tablet Take 1 tablet by mouth daily.       No current facility-administered medications on file prior to visit.   Family History  Problem Relation Age of Onset  . Asthma Mother    History   Social History  . Marital Status: Married    Spouse Name: N/A    Number of Children: N/A  . Years of Education: N/A   Occupational History  . Not on file.   Social History Main Topics  . Smoking status: Never Smoker   . Smokeless tobacco: Never Used  . Alcohol Use: No  . Drug Use: No  . Sexual Activity: Not Currently    Birth Control/ Protection: IUD   Other Topics Concern  . Not on file   Social History  Narrative  . No narrative on file    Review of Systems  Constitutional: Negative for fever, chills, diaphoresis, activity change, appetite change and fatigue.  HENT: Negative for ear pain, nosebleeds, congestion, facial swelling, rhinorrhea, neck pain, neck stiffness and ear discharge.   Eyes: Negative for pain, discharge, redness, itching and visual disturbance.  Respiratory: Negative for cough, choking, chest tightness, shortness of breath, wheezing and stridor.   Cardiovascular: Negative for chest pain, palpitations and leg swelling.  Gastrointestinal: Negative for abdominal distention.  Genitourinary: Negative for dysuria, urgency, frequency, hematuria, flank pain, decreased urine volume, difficulty urinating and dyspareunia.  Musculoskeletal: Negative for back pain, joint swelling, arthralgias and gait problem.  Neurological: Negative for dizziness, tremors, seizures, syncope, facial asymmetry, speech difficulty, weakness, light-headedness, numbness and headaches.  Hematological: Negative for adenopathy. Does not bruise/bleed easily.  Psychiatric/Behavioral: Negative for hallucinations, behavioral problems, confusion, dysphoric mood, decreased concentration and agitation.    Objective:   Filed Vitals:   01/29/14 1143  BP: 134/86  Pulse: 64  Temp: 98.7 F (37.1 C)    Physical Exam  Constitutional: Appears well-developed and well-nourished. No distress.  HENT: Normocephalic. External right and left ear normal. Oropharynx is clear and moist.  Eyes: Conjunctivae and EOM are normal. PERRLA, no scleral icterus.  Neck: Normal ROM. Neck supple. No JVD. No tracheal deviation. No thyromegaly.  CVS: RRR, S1/S2 +, no  murmurs, no gallops, no carotid bruit.  Pulmonary: Effort and breath sounds normal, no stridor, rhonchi, wheezes, rales.  Abdominal: Soft. BS +,  no distension, tenderness, rebound or guarding.  Musculoskeletal: As in history of present illness Neuro: Alert. Normal reflexes,  muscle tone coordination. No cranial nerve deficit. Skin: Skin is warm and dry. No rash noted. Not diaphoretic. No erythema. No pallor.  Psychiatric: Normal mood and affect. Behavior, judgment, thought content normal.   Lab Results  Component Value Date   WBC 8.7 02/11/2013   HGB 11.8* 02/11/2013   HCT 34.6* 02/11/2013   MCV 90.1 02/11/2013   PLT 210 02/11/2013   Lab Results  Component Value Date   CREATININE 0.66 02/10/2013   BUN 15 02/10/2013   NA 138 02/10/2013   K 3.7 02/10/2013   CL 104 02/10/2013   CO2 26 02/10/2013    No results found for this basename: HGBA1C   Lipid Panel  No results found for this basename: chol, trig, hdl, cholhdl, vldl, ldlcalc       Assessment and plan:   Patient Active Problem List   Diagnosis Date Noted  . Appendicitis, acute, with peritonitis 02/20/2013  . Twin pregnancy, delivered vaginally, current hospitalization 04/21/2012  . Multiparity 02/18/2012  . Short cervix, antepartum, twins 01/14/2012  . Twin pregnancy, antepartum 01/01/2012  . AMA (advanced maternal age) multigravida 35+ 01/01/2012  . Late prenatal care 01/01/2012  . High-risk pregnancy supervision 01/01/2012       Right Hip pain, unclear etiology Right SI joint pain and lumbar sprain with lumbar radiculopathy Left elbow pain likely tennis elbow Patient will be started on meloxicam daily with 5 day course of prednisone to see if her pain improves We'll obtain CT of the lumbar spine as well as CT of the pelvis to evaluate her hip joint Patient could possibly have lumbar spinal stenosis vs  spondylolisthesis Sports medicine referral for possible intra-articular/ESI injection   Establish care Pap smear in January 2015 Patient goes to the health Department for contraception She'll vaccine August 2014 Tetanus booster 2013

## 2014-01-30 LAB — VITAMIN D 25 HYDROXY (VIT D DEFICIENCY, FRACTURES): VIT D 25 HYDROXY: 30 ng/mL (ref 30–89)

## 2014-01-31 ENCOUNTER — Ambulatory Visit (HOSPITAL_COMMUNITY)
Admission: RE | Admit: 2014-01-31 | Discharge: 2014-01-31 | Disposition: A | Discharge status: Home / Self Care | Payer: No Typology Code available for payment source | Source: Ambulatory Visit | Attending: Internal Medicine | Admitting: Internal Medicine

## 2014-01-31 ENCOUNTER — Telehealth: Payer: Self-pay | Admitting: Emergency Medicine

## 2014-01-31 ENCOUNTER — Encounter (HOSPITAL_COMMUNITY): Payer: Self-pay

## 2014-01-31 DIAGNOSIS — M25559 Pain in unspecified hip: Secondary | ICD-10-CM | POA: Insufficient documentation

## 2014-01-31 DIAGNOSIS — M549 Dorsalgia, unspecified: Secondary | ICD-10-CM | POA: Insufficient documentation

## 2014-01-31 DIAGNOSIS — M25529 Pain in unspecified elbow: Secondary | ICD-10-CM | POA: Insufficient documentation

## 2014-01-31 MED ORDER — IOHEXOL 300 MG/ML  SOLN
100.0000 mL | Freq: Once | INTRAMUSCULAR | Status: AC | PRN
  Administered 2014-01-31: 100 mL via INTRAVENOUS

## 2014-01-31 NOTE — Telephone Encounter (Signed)
Left message to call pt with interpretor

## 2014-01-31 NOTE — Telephone Encounter (Signed)
Message copied by Darlis LoanSMITH, JILL D on Wed Jan 31, 2014  5:45 PM ------      Message from: Susie CassetteABROL MD, [facility]      Created: Wed Jan 31, 2014 10:59 AM       Notify patient that labs are normal except  triglycerides which are elevated. This can be corrected first by diet and exercise. Recommend low-fat diet.       ------

## 2014-02-05 ENCOUNTER — Telehealth: Payer: Self-pay | Admitting: Emergency Medicine

## 2014-02-05 NOTE — Telephone Encounter (Signed)
Pt given results and sports med referral information Pt given lab results as well per spanish pacific intepretor

## 2014-02-05 NOTE — Telephone Encounter (Signed)
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 Very subtle changes in her L5 spine, no obvious cord compression. Patient has been referred to sports medicine. Please notify patient that she needs to keep this appointment ------

## 2014-02-05 NOTE — Telephone Encounter (Signed)
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 Very subtle changes in her L5 spine, no obvious cord compression. Patient has been referred to sports medicine. Please notify patient that she needs to keep this appointment ------

## 2014-02-20 ENCOUNTER — Encounter: Payer: Self-pay | Admitting: Family Medicine

## 2014-02-20 ENCOUNTER — Ambulatory Visit (INDEPENDENT_AMBULATORY_CARE_PROVIDER_SITE_OTHER): Payer: No Typology Code available for payment source | Admitting: Family Medicine

## 2014-02-20 VITALS — BP 120/81 | Ht 60.0 in | Wt 155.0 lb

## 2014-02-20 DIAGNOSIS — M533 Sacrococcygeal disorders, not elsewhere classified: Secondary | ICD-10-CM

## 2014-02-20 DIAGNOSIS — G8929 Other chronic pain: Secondary | ICD-10-CM

## 2014-02-20 NOTE — Progress Notes (Signed)
CC: Right hip pain HPI: Patient presents with 2 months of right posterior hip pain. Dissection has been intermittent since 6 years ago. She was told she had arthritis in her hip. She was recently prescribed meloxicam which did help her some. She denies any radicular pain or numbness. She has tried some topical cream which also seems to help. 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  ROS: As above in the HPI. All other systems are stable or negative.  OBJECTIVE: APPEARANCE:  Patient in no acute distress.The patient appeared well nourished and normally developed. HEENT: No scleral icterus. Conjunctiva non-injected Resp: Non labored Skin: No rash MSK:  Right Hip exam:  - No swelling or deformity - Full range of motion in flexion, extension, internal and external rotation without pain - Negative logroll - Strength is 5 out of 5 and resisted hip flexion - Neurovascularly intact - FABERs negative  Low back exam: - Full range of motion in flexion, extension, lateral bending, rotation. She has mild pain with flexion - No tenderness to palpation over the spinous processes of the lumbar vertebra - Tenderness to palpation in the right SI joint which reproduces her pain  - Negative straight leg raise - Strength 5 out of 5 in the bilateral lower extremities. She can walk on heels and toe  MSK US: Not performed   ASSESSMENT: #1. Chronic intermittent right SI joint pain   PLAN: We will get her into physical therapy. She may continue her meloxicam. Followup 6 weeks. Consider diagnostic and therapeutic injection at that time if not improving.

## 2014-02-20 NOTE — Patient Instructions (Signed)
Thank you for coming in today  Refer to physical therapy Continue meloxicam  Followup 6 weeks

## 2014-02-27 ENCOUNTER — Ambulatory Visit: Payer: No Typology Code available for payment source | Attending: Family Medicine

## 2014-02-27 DIAGNOSIS — M25559 Pain in unspecified hip: Secondary | ICD-10-CM | POA: Insufficient documentation

## 2014-02-27 DIAGNOSIS — IMO0001 Reserved for inherently not codable concepts without codable children: Secondary | ICD-10-CM | POA: Insufficient documentation

## 2014-02-27 DIAGNOSIS — M6281 Muscle weakness (generalized): Secondary | ICD-10-CM | POA: Insufficient documentation

## 2014-02-27 DIAGNOSIS — R293 Abnormal posture: Secondary | ICD-10-CM | POA: Insufficient documentation

## 2014-03-05 ENCOUNTER — Telehealth: Payer: Self-pay | Admitting: Internal Medicine

## 2014-03-05 ENCOUNTER — Other Ambulatory Visit: Payer: Self-pay | Admitting: Internal Medicine

## 2014-03-05 ENCOUNTER — Ambulatory Visit: Payer: No Typology Code available for payment source | Attending: Internal Medicine

## 2014-03-05 DIAGNOSIS — Z5189 Encounter for other specified aftercare: Secondary | ICD-10-CM | POA: Insufficient documentation

## 2014-03-05 DIAGNOSIS — M6281 Muscle weakness (generalized): Secondary | ICD-10-CM | POA: Insufficient documentation

## 2014-03-05 DIAGNOSIS — M25559 Pain in unspecified hip: Secondary | ICD-10-CM | POA: Insufficient documentation

## 2014-03-05 DIAGNOSIS — R293 Abnormal posture: Secondary | ICD-10-CM | POA: Insufficient documentation

## 2014-03-05 NOTE — Telephone Encounter (Signed)
Pt had appt on 01/29/14, next f/u appt but needs med refill. Also pt is concerned because since last visit pt has been diagnosed with arthritis and does not know if this diagnosis will change the meds she is currently taking.  Please f/u with pt.

## 2014-03-12 ENCOUNTER — Ambulatory Visit: Payer: No Typology Code available for payment source

## 2014-03-16 ENCOUNTER — Other Ambulatory Visit: Payer: Self-pay | Admitting: Internal Medicine

## 2014-03-16 DIAGNOSIS — M549 Dorsalgia, unspecified: Secondary | ICD-10-CM

## 2014-04-03 ENCOUNTER — Ambulatory Visit (INDEPENDENT_AMBULATORY_CARE_PROVIDER_SITE_OTHER): Payer: No Typology Code available for payment source | Admitting: Family Medicine

## 2014-04-03 ENCOUNTER — Encounter: Payer: Self-pay | Admitting: Family Medicine

## 2014-04-03 VITALS — BP 108/73 | Ht 64.0 in | Wt 155.0 lb

## 2014-04-03 DIAGNOSIS — M533 Sacrococcygeal disorders, not elsewhere classified: Secondary | ICD-10-CM

## 2014-04-03 DIAGNOSIS — M25819 Other specified joint disorders, unspecified shoulder: Secondary | ICD-10-CM

## 2014-04-03 DIAGNOSIS — M7580 Other shoulder lesions, unspecified shoulder: Secondary | ICD-10-CM

## 2014-04-03 DIAGNOSIS — M7542 Impingement syndrome of left shoulder: Secondary | ICD-10-CM

## 2014-04-03 MED ORDER — METHYLPREDNISOLONE ACETATE 40 MG/ML IJ SUSP
40.0000 mg | Freq: Once | INTRAMUSCULAR | Status: AC
  Administered 2014-04-03: 40 mg via INTRA_ARTICULAR

## 2014-04-03 NOTE — Progress Notes (Signed)
CC: Followup right SI joint pain, left shoulder pain HPI: Patient returns for followup today. She has been in physical therapy and has completed therapy for her right posterior hip pain which I determined to be secondary to her SI joint. Unfortunately she does not note that she has had any improvement despite physical therapy. She does continue to take the meloxicam. Her pain is in the same location over her right SI joint.  She is also complaining of left shoulder pain. She says that it always hurts and nothing in particular makes it worse. She is worse in the morning. She was shown some exercises to do by her physical therapist and she says these do seem to help her. Unfortunately she is not getting better over the last 3 months. No weakness.  ROS: As above in the HPI. All other systems are stable or negative.  OBJECTIVE: APPEARANCE:  Patient in no acute distress.The patient appeared well nourished and normally developed. HEENT: No scleral icterus. Conjunctiva non-injected Resp: Non labored Skin: No rash MSK:  Left Shoulder - No swelling or deformity - No TTP over AC joint, biceps tendon, or lateral humerus - FROM in flexion, abduction, internal, external rotation - Strength 5/5 on shoulder abduction, internal rotation, external rotation, empty can - Painful Hawkin's - Neurovascularly intact  Low back exam: - Tenderness to palpation over the right SI joint - No tenderness to palpation over the spinous processes of the lumbar vertebra - Full range of motion of the hip with negative FADIR - Right SI joint is locked - Negative straight leg raise - Non-antalgic gait - No leg length discrepancy  MSK [place]: Not performed   ASSESSMENT: #1. Right SI joint dysfunction   PLAN: Discussed treatment options with the patient today. For her shoulder pain she seems to have a mild case of impingement. I added some additional exercises including the [place] 5 to her home exercise program. She  will perform these on a daily basis including internal rotation, external rotation, horizontal row, shoulder flexion and abduction. May consider corticosteroid subacromial injection in the future.  For her right SI joint pain, unfortunately she has not benefited from therapy. We'll therefore trial corticosteroid injection. This procedure was performed today after discussion of risks and benefits.  Consent obtained and verified.  Time-out conducted.  Noted no overlying erythema, induration, or other signs of local infection.  Skin prepped in a sterile fashion.  Topical analgesic spray: Ethyl chloride.  Joint: Right SI joint Needle: 22-gauge 1/2 inch Completed without difficulty.  Meds: 3 cc 1% lidocaine, 1 cc depomedrol Advised to call if fevers/chills, erythema, induration, drainage, or persistent bleeding.

## 2014-04-04 ENCOUNTER — Ambulatory Visit: Payer: No Typology Code available for payment source

## 2014-04-05 ENCOUNTER — Ambulatory Visit: Payer: No Typology Code available for payment source | Attending: Internal Medicine | Admitting: Internal Medicine

## 2014-04-05 ENCOUNTER — Encounter: Payer: Self-pay | Admitting: Internal Medicine

## 2014-04-05 VITALS — BP 107/74 | HR 83 | Temp 97.9°F | Resp 16 | Ht 62.0 in | Wt 160.0 lb

## 2014-04-05 DIAGNOSIS — M1610 Unilateral primary osteoarthritis, unspecified hip: Secondary | ICD-10-CM | POA: Insufficient documentation

## 2014-04-05 DIAGNOSIS — M1990 Unspecified osteoarthritis, unspecified site: Secondary | ICD-10-CM

## 2014-04-05 DIAGNOSIS — M129 Arthropathy, unspecified: Secondary | ICD-10-CM

## 2014-04-05 DIAGNOSIS — M549 Dorsalgia, unspecified: Secondary | ICD-10-CM

## 2014-04-05 DIAGNOSIS — M2560 Stiffness of unspecified joint, not elsewhere classified: Secondary | ICD-10-CM

## 2014-04-05 DIAGNOSIS — Z79899 Other long term (current) drug therapy: Secondary | ICD-10-CM | POA: Insufficient documentation

## 2014-04-05 DIAGNOSIS — M25569 Pain in unspecified knee: Secondary | ICD-10-CM | POA: Insufficient documentation

## 2014-04-05 LAB — RHEUMATOID FACTOR

## 2014-04-05 MED ORDER — MELOXICAM 15 MG PO TABS: ORAL_TABLET | ORAL | Status: DC

## 2014-04-05 NOTE — Progress Notes (Signed)
Pt here for f/u right hip pain radiating to right leg, being followed by sports medicine s/p steroid injection Tuesday. States the pain is improving C/o ride side intermit headaches. Denies n/v/ or blurred vision.vss Spanish interpretor present

## 2014-04-05 NOTE — Patient Instructions (Signed)
Artritis inespecífica  (Arthritis, Nonspecific)  La artritis es la inflamación de una articulación. Los síntomas son dolor, enrojecimiento, calor o hinchazón. Pueden verse involucradas una o más articulaciones. Hay diferentes tipos de artritis. El médico no podrá diagnosticar inmediatamente cuál es el tipo de artritis que usted sufre.   CAUSAS  La causa más frecuente es el desgaste de la articulación (osteoartritis). Esto ocasiona lesiones en el cartílago, que puede romperse con el tiempo. Las zonas más afectadas por este tipo de artritis son las rodillas, caderas, espalda y cuello.  Otros tipos de artritis y causas frecuentes de dolor en la articulación son:  · Esguinces y otras lesiones cercanas a la articulación}. En algunos casos, esguinces y lesiones menores causan dolor e hinchazón que aparece horas más tarde.  · Artritis reumatoidea Afecta las manos, pies y rodillas. Generalmente afecta ambos lados del cuerpo al mismo tiempo. Generalmente se asocia a enfermedades crónicas, fiebre, pérdida de peso y debilidad general.  · Artritis por cristales. La gota y la pseudogota pueden causar dolor intenso agudo ocasional, enrojecimiento e hinchazón del pie, el tobillo o la rodilla.  · Artritis infecciosa. Las bacterias pueden penetrar en la articulación a través de una herida en la piel. Esto puede causar una infección en la articulación. Las bacterias y virus también pueden diseminarse a través del torrente sanguíneo y afectar las articulaciones.  · Reacciones a medicamentos, infecciosas y alérgicas. En algunos casos las articulaciones duelen levemente y están ligeramente hinchadas en este tipo de enfermedad.  SÍNTOMAS  · El dolor es el síntoma principal.  · La articulación también pueden verse roja, hinchada y caliente al tacto.  · En ciertos tipos de artritis hay fiebre o malestar general.  · En la articulación que presenta artritis sentirá dolor con el movimiento. En otros tipos de artritis hay  rigidez.  DIAGNÓSTICO:  El médico sospechará artritis basándose en la descripción de los síntomas y en el examen. Será necesario realizar pruebas para diagnosticar el tipo de artritis.  · Análisis de sangre y en algunos casos de orina.  · Radiografías y en algunos casos tomografía computada o diagnóstico por imágenes.  · La remoción del líquido de la articulación (artrocentesis) se realiza para controlar la presencia de bacterias, cristales o por otras causas. Su médico (o un especialista) adormecerán la zona de la articulación con un anestésico local y utilizarán una aguja para retirar líquido de la articulación para ser examinado. Este procedimiento es sólo mínimamente molesto.  · Aún con estas pruebas, el médico no podrá decir qué tipo de artritis usted sufre. La consulta con un especialista (reumatólogo) puede ser de utilidad.  TRATAMIENTO  El médico comentará con usted el tratamiento específico para su tipo de artritis. Si el tipo específico no puede determinarse, podrán aplicarse las siguientes recomendaciones generales.   El tratamiento para el dolor intenso de las articulaciones consiste en:  · Hacer reposo  · Elevar el miembro.  · Podrán prescribirle medicamentos antiinflamatorios (como ibuprofeno). Evite las actividades que aumenten el dolor.  · Sólo tome medicamentos de venta libre o prescriptos para calmar el dolor y las molestias, según las indicaciones de su médico.  · Puede aplicarse compresas frías sobre la articulación dolorida durante 10 a 15 minutos cada hora. Las compresas calientes también pueden ser beneficiosas, pero no las utilice durante la noche. No use compresas calientes sin autorización de su médico, si es diabético.  · Una inyección de corticoides en la articulación artrítica puede ayudar a reducir el dolor y la hinchazón.  ·   Si una artritis aguda empeora en los siguientes 1 ó 2 días, será necesario descartar una infección.  El tratamiento prolongado implica la modificación de  actividades y del estilo de vida para reducir el estrés en la articulación. Puede ser necesario que baje de peso. La actividad física es necesaria para nutrir el cartílago de la articulación y eliminar los desechos. Esto ayuda a mantener fuertes los músculos que rodean la articulación.  INSTRUCCIONES PARA EL CUIDADO DOMICILIARIO  · No tome aspirina para aliviar el dolor si se sospecha que sufre gota. Esto eleva los niveles de ácido úrico.  · Solo tome medicamentos que se pueden comprar sin receta o recetados para el dolor, malestar o fiebre, como le indica el médico.  · Haga reposo todo el tiempo que pueda.  · Si la articulación está hinchada, manténgala elevada.  · Utilice muletas si la articulación que le duele está en la pierna.  · Beber abundante cantidad de líquidos será beneficioso para ciertos tipos de artritis.  · Siga las indicaciones del profesional.  · La actividad física regular puede ser beneficiosa, incluyendo las actividades de bajo impacto como:  · Natación.  · Aquagym.  · Andar en bicicleta.  · Caminar.  · La rigidez matutina se alivia con una ducha caliente.  · También es beneficioso que realice ejercicios de amplitud de movimiento.  SOLICITE ATENCIÓN MÉDICA SI:  · No se siente mejor o empeora luego de las 24 horas.  · Presenta efectos adversos por los medicamentos y no mejora con el tratamiento.  SOLICITE ATENCIÓN MÉDICA INMEDIATAMENTE SI:  · Tiene fiebre.  · Presenta fiebre o dolor intenso, hinchazón o enrojecimiento.  · Muchas articulaciones están involucradas y están hinchadas y siente dolor.  · Tiene un dolor intenso en la espalda o siente debilidad en las piernas.  · Pierde el control de la vejiga o del intestino.  Document Released: 10/19/2005 Document Revised: 01/11/2012  ExitCare® Patient Information ©2014 [employer].

## 2014-04-05 NOTE — Progress Notes (Signed)
Patient ID: Carrie Buchanan, female   DOB: 03/08/1976, 38 y.o.   MRN: [id number]  CC: f/u hip pain  HPI: Patient is being followed by sports medicine and just received steroid injections two days ago. She reports pain in right hip that radiates down her right leg. She reports achy bilateral knee pain.  Reports that she has better ROM with moderate pain since injections 2 days ago.  Patient reports that she was taking omeprazole and flexeril for pain with no relief.  Patient thought the omeprazole was for pain.   No Known Allergies Past Medical History  Diagnosis Date  . Medical history non-contributory   . Appendicitis, acute, with peritonitis 02/20/2013   Current Outpatient Prescriptions on File Prior to Visit  Medication Sig Dispense Refill  . Ascorbic Acid (VITAMIN C) 100 MG tablet Take 100 mg by mouth daily.      . cyclobenzaprine (FLEXERIL) 10 MG tablet TAKE 1 TABLET BY MOUTH NIGHTLY AT BEDTIME.  30 tablet  0  . meloxicam (MOBIC) 15 MG tablet TAKE 1 TABLET BY MOUTH ONCE DAILY.  30 tablet  0  . Multiple Vitamins-Minerals (MULTIVITAMIN WITH MINERALS) tablet Take 1 tablet by mouth daily.      Marland Kitchen. omeprazole (PRILOSEC) 20 MG capsule Take 1 capsule (20 mg total) by mouth daily.  30 capsule  3  . predniSONE (DELTASONE) 20 MG tablet Take 1 tablet (20 mg total) by mouth daily with breakfast.  5 tablet  0   No current facility-administered medications on file prior to visit.   Family History  Problem Relation Age of Onset  . Asthma Mother    History   Social History  . Marital Status: Married    Spouse Name: N/A    Number of Children: N/A  . Years of Education: N/A   Occupational History  . Not on file.   Social History Main Topics  . Smoking status: Never Smoker   . Smokeless tobacco: Never Used  . Alcohol Use: No  . Drug Use: No  . Sexual Activity: Not Currently    Birth Control/ Protection: IUD   Other Topics Concern  . Not on file   Social History Narrative  . No  narrative on file    Review of Systems: See HPI  Trapezius muscle tenderness Positive straight leg raises   Objective:   Filed Vitals:   04/05/14 1009  BP: 107/74  Pulse: 83  Temp: 97.9 F (36.6 C)  Resp: 16   Physical Exam  Vitals reviewed. Cardiovascular: Normal rate, regular rhythm, normal heart sounds and intact distal pulses.   Pulmonary/Chest: Effort normal and breath sounds normal.  Abdominal: Soft. Bowel sounds are normal. She exhibits no distension. There is no tenderness.  Musculoskeletal: Normal range of motion. She exhibits tenderness. She exhibits no edema.  Positive straight leg raises Trapezius muscle tenderness  Skin: Skin is warm and dry.  Psychiatric: She has a normal mood and affect. Thought content normal.     Lab Results  Component Value Date   WBC 11.0* 01/29/2014   HGB 13.8 01/29/2014   HCT 40.6 01/29/2014   MCV 90.4 01/29/2014   PLT 393 01/29/2014   Lab Results  Component Value Date   CREATININE 0.70 01/29/2014   BUN 17 01/29/2014   NA 137 01/29/2014   K 4.9 01/29/2014   CL 101 01/29/2014   CO2 28 01/29/2014    No results found for this basename: HGBA1C   Lipid Panel     Component Value  Date/Time   CHOL 202* 01/29/2014 1204   TRIG 250* 01/29/2014 1204   HDL 50 01/29/2014 1204   CHOLHDL 4.0 01/29/2014 1204   VLDL 50* 01/29/2014 1204   LDLCALC 102* 01/29/2014 1204       Assessment and plan:   [place] was seen today for follow-up, headache and hip pain.  Diagnoses and associated orders for this visit:  Arthritis Explained what each medication should be taken for. Patient will take Mobic daily for pain relief.   Multiple stiff joints - ANA - Rheumatoid factor Back pain - meloxicam (MOBIC) 15 MG tablet; TAKE 1 TABLET BY MOUTH ONCE DAILY.  Return in about 3 months (around 07/06/2014) for arthritis.  Explained that omeprazole was for acid reflux and that flexeril was for muscle spasms.  Explained that is potentially why she was not receiving  pain releif.  Will try over next two week and reevaluate.  Ambrose Finland, NP-C [facility] [phone] 04/06/2014, 9:25 AM

## 2014-04-06 LAB — ANA: Anti Nuclear Antibody(ANA): NEGATIVE

## 2014-04-10 ENCOUNTER — Telehealth: Payer: Self-pay | Admitting: *Deleted

## 2014-04-10 NOTE — Telephone Encounter (Signed)
Message copied by Raynelle Chary on Tue Apr 10, 2014  5:34 PM ------      Message from: Ambrose Finland      Created: Fri Apr 06, 2014  6:37 PM       Negative for autoimmune disorder ------

## 2014-04-10 NOTE — Telephone Encounter (Signed)
Pt is aware of her lab results.  

## 2014-05-02 ENCOUNTER — Ambulatory Visit: Payer: No Typology Code available for payment source | Admitting: Internal Medicine

## 2014-06-29 ENCOUNTER — Ambulatory Visit: Payer: No Typology Code available for payment source | Attending: Internal Medicine

## 2014-07-11 ENCOUNTER — Ambulatory Visit: Payer: No Typology Code available for payment source | Attending: Internal Medicine | Admitting: Internal Medicine

## 2014-07-11 ENCOUNTER — Encounter: Payer: Self-pay | Admitting: Internal Medicine

## 2014-07-11 VITALS — BP 100/66 | HR 53 | Temp 98.6°F | Ht 62.0 in | Wt 158.2 lb

## 2014-07-11 DIAGNOSIS — R7301 Impaired fasting glucose: Secondary | ICD-10-CM | POA: Insufficient documentation

## 2014-07-11 DIAGNOSIS — H9312 Tinnitus, left ear: Secondary | ICD-10-CM

## 2014-07-11 DIAGNOSIS — M533 Sacrococcygeal disorders, not elsewhere classified: Secondary | ICD-10-CM

## 2014-07-11 DIAGNOSIS — H9319 Tinnitus, unspecified ear: Secondary | ICD-10-CM | POA: Insufficient documentation

## 2014-07-11 DIAGNOSIS — E781 Pure hyperglyceridemia: Secondary | ICD-10-CM | POA: Insufficient documentation

## 2014-07-11 DIAGNOSIS — Z139 Encounter for screening, unspecified: Secondary | ICD-10-CM

## 2014-07-11 NOTE — Progress Notes (Signed)
MRN: [id number] Name: Carrie Buchanan  Sex: female Age: 38 y.o. DOB: Dec 15, 1975  Allergies: Review of patient's allergies indicates no known allergies.  Chief Complaint  Patient presents with  . Arthritis    HPI: Patient is 38 y.o. female who has to of arthritis, sacroiliac joint dysfunction comes today for followup, she reports improvement has been following up with her sports medicine and underwent steroid injection or that she has minimal pain, she reported to have symptoms of her ringing sensation in the left ear denies any hearing problem denies any fever chills nausea vomiting blurry vision or balance problem, she had blood work done in the past which was reviewed with the patient noticed impaired fasting glucose and high triglycerides.  Past Medical History  Diagnosis Date  . Medical history non-contributory   . Appendicitis, acute, with peritonitis 02/20/2013    Past Surgical History  Procedure Laterality Date  . Laparoscopic appendectomy N/A 02/09/2013    Procedure: APPENDECTOMY LAPAROSCOPIC;  Surgeon: Emelia Loron, MD;  Location: [facility] OR;  Service: General;  Laterality: N/A;  . Intrauterine device insertion  09/2012  . Appendectomy        Medication List       This list is accurate as of: 07/11/14 12:58 PM.  Always use your most recent med list.               multivitamin with minerals tablet  Take 1 tablet by mouth daily.     vitamin C 100 MG tablet  Take 100 mg by mouth daily.        No orders of the defined types were placed in this encounter.    Immunization History  Administered Date(s) Administered  . Tdap 02/18/2012    Family History  Problem Relation Age of Onset  . Asthma Mother     History  Substance Use Topics  . Smoking status: Never Smoker   . Smokeless tobacco: Never Used  . Alcohol Use: No    Review of Systems   As noted in HPI  Filed Vitals:   07/11/14 1233  BP: 100/66  Pulse: 53  Temp: 98.6 F (37 C)     Physical Exam  Physical Exam  Constitutional: No distress.  HENT:  Both TMs visualized not congested   Eyes: EOM are normal. Pupils are equal, round, and reactive to light.  Cardiovascular: Normal rate and regular rhythm.   Pulmonary/Chest: Breath sounds normal. No respiratory distress. She has no wheezes. She has no rales.  Musculoskeletal: She exhibits no edema.    CBC    Component Value Date/Time   WBC 11.0* 01/29/2014 1204   RBC 4.49 01/29/2014 1204   HGB 13.8 01/29/2014 1204   HCT 40.6 01/29/2014 1204   PLT 393 01/29/2014 1204   MCV 90.4 01/29/2014 1204   LYMPHSABS 3.2 01/29/2014 1204   MONOABS 0.9 01/29/2014 1204   EOSABS 0.2 01/29/2014 1204   BASOSABS 0.0 01/29/2014 1204    CMP     Component Value Date/Time   NA 137 01/29/2014 1204   K 4.9 01/29/2014 1204   CL 101 01/29/2014 1204   CO2 28 01/29/2014 1204   GLUCOSE 106* 01/29/2014 1204   BUN 17 01/29/2014 1204   CREATININE 0.70 01/29/2014 1204   CREATININE 0.66 02/10/2013 0510   CALCIUM 9.7 01/29/2014 1204   PROT 7.5 01/29/2014 1204   ALBUMIN 4.2 01/29/2014 1204   AST 18 01/29/2014 1204   ALT 27 01/29/2014 1204   ALKPHOS 61 01/29/2014 1204  BILITOT 0.5 01/29/2014 1204   GFRNONAA >89 01/29/2014 1204   GFRNONAA >90 02/10/2013 0510   GFRAA >89 01/29/2014 1204   GFRAA >90 02/10/2013 0510    Lab Results  Component Value Date/Time   CHOL 202* 01/29/2014 12:04 PM    No components found with this basename: hga1c    Lab Results  Component Value Date/Time   AST 18 01/29/2014 12:04 PM    Assessment and Plan  SI (sacroiliac) joint dysfunction Improved  after steroid injections.  Ringing in ear, left Patient denies any use of aspirin, no other symptoms except tinnitus, If symptomatically worse consider referral to ENT.  IFG (impaired fasting glucose) Advise patient for low carbohydrate diet.   High triglycerides Advised patient follow. Will repeat fasting lipid panel on the next visit.  Screening - Plan: MM DIGITAL  SCREENING BILATERAL   Health Maintenance  -Mammogram: ordered    Return in about 3 months (around 10/10/2014) for hyperipidemia, IFG.  Doris Cheadle, MD

## 2014-07-11 NOTE — Patient Instructions (Addendum)
Dieta para el control del colesterol y las grasas  (Fat and Cholesterol Control Diet) Los niveles de grasa y colesterol en la sangre y en los rganos se ven influidos por la dieta. Los niveles altos de grasa y colesterol pueden conducir a enfermedades del corazn, de los pequeos y los grandes vasos sanguneos, de la vescula biliar, el hgado y el pncreas.  CONTROL DE LA GRASA Y EL COLESTEROL CON LA DIETA  Aunque el ejercicio y el estilo de vida son factores importantes, su dieta es la clave. Esto se debe a que se sabe que ciertos alimentos hacen subir el colesterol y otros lo bajan. El objetivo debe ser equilibrar los alimentos, de modo que tengan un efecto sobre el colesterol y, an ms importante, reemplazar las grasas saturadas y trans con otros tipos de grasas, como las monoinsaturadas y las poliinsaturadas y cidos grasos omega-3.  En promedio, una persona no debe consumir ms de 15 a 17 g de grasas saturadas por da. Las grasas saturadas y trans se consideran grasas "malas", ya que elevan el colesterol LDL. Las grasas saturadas se encuentran principalmente en productos animales como carne, manteca y crema. Sin embargo, eso no significa que tenga que renunciar a todas sus comidas favoritas. Actualmente, hay buenos sustitutos bajos en colesterol, bajos en grasas para la mayora de las cosas que le gusta comer. Elija aquellos alimentos alternativos que sean bajos en grasas o sin grasas. Elija cortes de peceto o lomo de carne roja. Estos tipos de cortes contienen menos grasa y colesterol. Pollo (sin la piel), pescado, ternera y pechuga de pavo molida son excelentes opciones. Eliminar las carnes grasas, como las salchichas y el salame. Los mariscos contienen poca o casi nada de grasas saturadas. Consuma una porcin de 3 oz (85 g) de carne magra, aves o pescado.  Las grasas trans tambin se llaman "aceites parcialmente hidrogenados". Son aceites manipulados cientficamente de modo que son slidos a  temperatura ambiente, tienen una larga vida y mejoran el sabor y la textura de los alimentos a los que se agregan. Las grasas trans se encuentran en la margarina, masitas, crackers y alimentos horneados.  Al hornear y cocinar, el aceite es un buen sustituto de la manteca. Los aceites monoinsaturados son beneficiosos, sobre todo porque se cree que reducen el colesterol LDL y aumentan el HDL. Los aceites que hay que evitar completamente son los aceites tropicales saturados, como el de coco y palma.  Recuerde consumir una gran cantidad de alimentos de los grupos que estn naturalmente libres de grasas saturadas y grasas trans, e incluya pescado, frutas, verduras, frijoles, granos (cebada, arroz, cuscs, trigo bulgur) y pastas (sin salsas de crema).  IDENTIFICACIN DE LOS ALIMENTOS QUE REDUCEN LAS GRASAS Y ELCOLESTEROL  La fibra soluble puede reducir el colesterol. Este tipo de fibra se encuentra en las frutas como manzanas, verduras como el brcoli, papas y zanahorias, las legumbres como los frijoles, guisantes y lentejas y granos como la cebada. Los alimentos enriquecidos con esteroles vegetales (fitosteroles) tambin pueden reducir el colesterol. Consuma al menos 2 g por da de estos alimentos para un efecto de disminucin del colesterol.  Lea las etiquetas de los paquetes para identificar los alimentos bajos en grasas saturadas, en grasas trans y los bajos en grasas en el supermercado. Seleccione los quesos que tienen slo 2 a 3 g de grasa saturada por onza. Utilice margarina saludable para el corazn que sea libre de grasas trans o aceites parcialmente hidrogenados. Al comprar productos de panadera (galletas, crackers), se   deben evitar los aceites parcialmente hidrogenados. Panes y panecillos deben hacerse con cereales integrales (trigo integral o harina de avena integral en lugar de " harina " o " harina enriquecida ") Compre sopas en lata que no sean cremosas, con bajo contenido de sal y sin grasas  adicionadas.  TCNICAS DE PREPARACIN DE LOS ALIMENTOS  Nunca prepare los alimentos fritos. Si usted debe frer, saltee los alimentos en muy poca grasa o use un aerosol de cocina anti adherente. Siempre que sea posible, debe hervir, hornear o asar las carnes y preparar las verduras al vapor. En lugar de agregar mantequilla o margarina a las verduras, use limn y hierbas, pur de manzana y canela (para la calabaza y la batata). Utilice yogur natural sin grasa, salsas y aderezos bajos en grasa para ensaladas.  BAJO EN GRASAS SATURADAS / SUSTITUTOS BAJOS EN GRASA  Carnes / grasas saturadas (g)  Evite: Bife, veteado (3 oz/85 g) / 11 g  Elija: Bife, magro(3 oz/85 g) / 4 g  Evite: Hamburguesa (3 oz/85 g) / 7 g  Elija: Hamburguesa, magra (3 oz/85 g) / 5 g  Evite: Jamn (3 oz/85 g) / 6 g  Elija: Jamn, corte magro (3 oz/85 g) / 2,4 g  Evite: Pollo con piel, carne oscura (3 oz/85 g) / 4 g  Elija: Pollo, sin piel, carne oscura (3 oz/85 g) / 2 g  Evite: Pollo con piel, carne blanca (3 oz/85 g) / 2,5 g  Elija: Pollo, sin piel, carne blanca (3 oz/85 g) / 1 g Lcteos / Grasa saturada (g)   Evite: Leche entera (1 taza) / 5 g  Elija: Leche descremada, 2% (1 taza) / 3 g  Elija: Leche descremada, 1% (1 taza) / 1,5 g  Elija: Leche descremada, 1 taza (0,3 g).  Evite: Queso duro (1 oz/28 g) / 6 g  Elija: Queso de leche descremada (1 oz/28 g) / 2 a 3 g  Evite: Queso cottage, 4% de grasa (1 taza) / 6,5 g  Elija: Queso cottage bajo en grasa, 1% de grasa (1 taza) / 1,5 g  Evite: Helado (1 taza) / 9 g  Elija: Sorbete (1 taza) / 2,5 g  Elija: Yogur congelado descremado (1 taza) / 0,3 g  Elija: Barra de frutas congeladas / trace  Evite: Crema batida (1 cucharada) / 3,5 g  Elija: Crema batida no lctea (1 cucharada) / 1 g Condimentos / Grasas Saturadas (g)   Evite: Mayonesa (1 cucharada) / 2 g  Elija: Mayonesa baja en grasa (1 cucharada) / 1 g  Evite: Mantequilla (1 cucharada) / 7  g  Elija: Margarina light extra (1 cucharada) / 1 g  Evite: Aceite de coco (1 cucharada) / 11,8 g  Elija: Aceite de oliva (1 cucharada) / 1,8 g  Elija: Aceite de maz (1 cucharada) / 1,7 g  Elija: Aceite de crtamo (1 cucharada) / 1,2 g  Elija: Aceite de girasol (1 cucharada) / 1,4 g  Elija: Aceite de soja (1 cucharada) / 0 mg / 2,4 g  Elija: Aceite de canola (1 cucharada) / 0 mg / 1 g Document Released: 10/19/2005 Document Revised: 02/13/2013 Exit[employer] Patient Information 2015 [employer]. This information is not intended to replace advice given to you by your health care provider. Make sure you discuss any questions you have with your health care provider. La diabetes mellitus y los alimentos (Diabetes Mellitus and Food) Es importante que controle su nivel de azcar en la sangre (glucosa). El nivel de glucosa en sangre   depende en gran medida de lo que usted come. Comer alimentos saludables en las cantidades adecuadas a lo largo del da, aproximadamente a la misma hora todos los das, lo ayudar a controlar su nivel de glucosa en sangre. Tambin puede ayudarlo a retrasar o evitar el empeoramiento de la diabetes mellitus. Comer de manera saludable incluso puede ayudarlo a mejorar el nivel de presin arterial y a alcanzar o mantener un peso saludable.  CMO PUEDEN AFECTARME LOS ALIMENTOS? Carbohidratos Los carbohidratos afectan el nivel de glucosa en sangre ms que cualquier otro tipo de alimento. El nutricionista lo ayudar a determinar cuntos carbohidratos puede consumir en cada comida y ensearle a contarlos. El recuento de carbohidratos es importante para mantener la glucosa en sangre en un nivel saludable, en especial si utiliza insulina o toma determinados medicamentos para la diabetes mellitus. Alcohol El alcohol puede provocar disminuciones sbitas de la glucosa en sangre (hipoglucemia), en especial si utiliza insulina o toma determinados medicamentos para la diabetes mellitus.  La hipoglucemia es una afeccin que puede poner en peligro la vida. Los sntomas de la hipoglucemia (somnolencia, mareos y desorientacin) son similares a los sntomas de haber consumido mucho alcohol.  Si el mdico lo autoriza a beber alcohol, hgalo con moderacin y siga estas pautas:  Las mujeres no deben beber ms de un trago por da, y los hombres no deben beber ms de dos tragos por da. Un trago es igual a:  12 onzas (355 ml) de cerveza  5 onzas de vino (150 ml) de vino  1,5onzas (45ml) de bebidas espirituosas  No beba con el estmago vaco.  Mantngase hidratado. Beba agua, gaseosas dietticas o t helado sin azcar.  Las gaseosas comunes, los jugos y otros refrescos podran contener muchos carbohidratos y se deben contar. QU ALIMENTOS NO SE RECOMIENDAN? Cuando haga las elecciones de alimentos, es importante que recuerde que todos los alimentos son distintos. Algunos tienen menos nutrientes que otros por porcin, aunque podran tener la misma cantidad de caloras o carbohidratos. Es difcil darle al cuerpo lo que necesita cuando consume alimentos con menos nutrientes. Estos son algunos ejemplos de alimentos que debera evitar ya que contienen muchas caloras y carbohidratos, pero pocos nutrientes:  Grasas trans (la mayora de los alimentos procesados incluyen grasas trans en la etiqueta de Informacin nutricional).  Gaseosas comunes.  Jugos.  Caramelos.  Dulces, como tortas, pasteles, rosquillas y galletas.  Comidas fritas. QU ALIMENTOS PUEDO COMER? Consuma alimentos ricos en nutrientes, que nutrirn el cuerpo y lo mantendrn saludable. Los alimentos que debe comer tambin dependern de varios factores, como:  Las caloras que necesita.  Los medicamentos que toma.  Su peso.  El nivel de glucosa en sangre.  El nivel de presin arterial.  El nivel de colesterol. Tambin debe consumir una variedad de alimentos, como:  Protenas, como carne, aves, pescado,  tofu, frutos secos y semillas (las protenas de animales magros son mejores).  Frutas.  Verduras.  Productos lcteos, como leche, queso y yogur (descremados son mejores).  Panes, granos, pastas, cereales, arroz y frijoles.  Grasas, como aceite de oliva, margarina sin grasas trans, aceite de canola, aguacate y aceitunas. TODOS LOS QUE PADECEN DIABETES MELLITUS TIENEN EL MISMO PLAN DE COMIDAS? Dado que todas las personas que padecen diabetes mellitus son distintas, no hay un solo plan de comidas que funcione para todos. Es muy importante que se rena con un nutricionista que lo ayudar a crear un plan de comidas adecuado para usted. Document Released: 01/26/2008 Document Revised: 10/24/2013 ExitCare Patient   Information 2015 [employer]. This information is not intended to replace advice given to you by your health care provider. Make sure you discuss any questions you have with your health care provider.  

## 2014-07-11 NOTE — Progress Notes (Signed)
Patient presents today for arthritis follow up. Patient states that she is much better with 0/10 pain scale at the moment. Patient does state that her right knee does flare up at times. Patient is also having a "ringing" in her right ear on and off for 1 month.

## 2014-08-08 ENCOUNTER — Ambulatory Visit: Payer: Self-pay | Attending: Internal Medicine

## 2014-08-08 DIAGNOSIS — Z23 Encounter for immunization: Secondary | ICD-10-CM

## 2014-08-09 ENCOUNTER — Ambulatory Visit: Payer: Self-pay | Attending: Internal Medicine | Admitting: Internal Medicine

## 2014-08-09 ENCOUNTER — Encounter: Payer: Self-pay | Admitting: Internal Medicine

## 2014-08-09 VITALS — BP 103/71 | HR 72 | Temp 98.4°F | Ht 62.0 in | Wt 154.0 lb

## 2014-08-09 DIAGNOSIS — Z23 Encounter for immunization: Secondary | ICD-10-CM | POA: Insufficient documentation

## 2014-08-09 DIAGNOSIS — R1011 Right upper quadrant pain: Secondary | ICD-10-CM | POA: Insufficient documentation

## 2014-08-09 DIAGNOSIS — R7301 Impaired fasting glucose: Secondary | ICD-10-CM | POA: Insufficient documentation

## 2014-08-09 DIAGNOSIS — Z139 Encounter for screening, unspecified: Secondary | ICD-10-CM

## 2014-08-09 DIAGNOSIS — E781 Pure hyperglyceridemia: Secondary | ICD-10-CM

## 2014-08-09 LAB — COMPLETE METABOLIC PANEL WITH GFR
ALT: 145 U/L — ABNORMAL HIGH (ref 0–35)
AST: 72 U/L — ABNORMAL HIGH (ref 0–37)
Albumin: 4.1 g/dL (ref 3.5–5.2)
Alkaline Phosphatase: 62 U/L (ref 39–117)
BUN: 15 mg/dL (ref 6–23)
CALCIUM: 9 mg/dL (ref 8.4–10.5)
CHLORIDE: 103 meq/L (ref 96–112)
CO2: 25 mEq/L (ref 19–32)
CREATININE: 0.69 mg/dL (ref 0.50–1.10)
GFR, Est Non African American: >89 mL/min
Glucose, Bld: 101 mg/dL — ABNORMAL HIGH (ref 70–99)
POTASSIUM: 3.8 meq/L (ref 3.5–5.3)
SODIUM: 138 meq/L (ref 135–145)
Total Bilirubin: 0.7 mg/dL (ref 0.2–1.2)
Total Protein: 6.8 g/dL (ref 6.0–8.3)

## 2014-08-09 LAB — LIPID PANEL
CHOL/HDL RATIO: 4 ratio
Cholesterol: 151 mg/dL (ref 0–200)
HDL: 38 mg/dL — ABNORMAL LOW (ref >39)
LDL Cholesterol: 88 mg/dL (ref 0–99)
Triglycerides: 127 mg/dL (ref <150)
VLDL: 25 mg/dL (ref 0–40)

## 2014-08-09 NOTE — Progress Notes (Signed)
MRN: [id number] Name: Carrie Buchanan  Sex: female Age: 38 y.o. DOB: [birth year]  Allergies: Review of patient's allergies indicates no known allergies.  Chief Complaint  Patient presents with  . Abdominal Pain    strong pain started in center abd on Monday, has been getting less andn less each day.    HPI: Patient is 38 y.o. female who comes today her reported to have upper abdominal pain for the last one week as per patient her and has gradually improved denies any nausea vomiting change in bowel habits denies any change in the color of stool or urine, denies any reflux symptoms, denies any fever chills chest and shortness of breath, as per patient she took Alka-Seltzer and it helped her with the symptoms. Previous blood work reviewed patient has history of high triglycerides as well as her impaired fasting glucose.  Past Medical History  Diagnosis Date  . Medical history non-contributory   . Appendicitis, acute, with peritonitis 02/20/2013    Past Surgical History  Procedure Laterality Date  . Laparoscopic appendectomy N/A 02/09/2013    Procedure: APPENDECTOMY LAPAROSCOPIC;  Surgeon: Emelia Loron, MD;  Location: [facility] OR;  Service: General;  Laterality: N/A;  . Intrauterine device insertion  09/2012  . Appendectomy        Medication List       This list is accurate as of: 08/09/14 11:39 AM.  Always use your most recent med list.               multivitamin with minerals tablet  Take 1 tablet by mouth daily.     vitamin C 100 MG tablet  Take 100 mg by mouth daily.        No orders of the defined types were placed in this encounter.    Immunization History  Administered Date(s) Administered  . Influenza,inj,Quad PF,36+ Mos 08/08/2014  . Tdap 02/18/2012    Family History  Problem Relation Age of Onset  . Asthma Mother     History  Substance Use Topics  . Smoking status: Never Smoker   . Smokeless tobacco: Never Used  . Alcohol Use: No     Review of Systems   As noted in HPI  Filed Vitals:   08/09/14 1114  BP: 103/71  Pulse: 72  Temp: 98.4 F (36.9 C)    Physical Exam  Physical Exam  Eyes: EOM are normal. Pupils are equal, round, and reactive to light.  Cardiovascular: Normal rate and regular rhythm.   Pulmonary/Chest: Breath sounds normal. No respiratory distress. She has no wheezes. She has no rales.  Abdominal: Soft. She exhibits no distension. There is no rebound and no guarding.  RUQ pain with deep palpation   Musculoskeletal: She exhibits no edema.    CBC    Component Value Date/Time   WBC 11.0* 01/29/2014 1204   RBC 4.49 01/29/2014 1204   HGB 13.8 01/29/2014 1204   HCT 40.6 01/29/2014 1204   PLT 393 01/29/2014 1204   MCV 90.4 01/29/2014 1204   LYMPHSABS 3.2 01/29/2014 1204   MONOABS 0.9 01/29/2014 1204   EOSABS 0.2 01/29/2014 1204   BASOSABS 0.0 01/29/2014 1204    CMP     Component Value Date/Time   NA 137 01/29/2014 1204   K 4.9 01/29/2014 1204   CL 101 01/29/2014 1204   CO2 28 01/29/2014 1204   GLUCOSE 106* 01/29/2014 1204   BUN 17 01/29/2014 1204   CREATININE 0.70 01/29/2014 1204   CREATININE 0.66 02/10/2013 0510  CALCIUM 9.7 01/29/2014 1204   PROT 7.5 01/29/2014 1204   ALBUMIN 4.2 01/29/2014 1204   AST 18 01/29/2014 1204   ALT 27 01/29/2014 1204   ALKPHOS 61 01/29/2014 1204   BILITOT 0.5 01/29/2014 1204   GFRNONAA >89 01/29/2014 1204   GFRNONAA >90 02/10/2013 0510   GFRAA >89 01/29/2014 1204   GFRAA >90 02/10/2013 0510    Lab Results  Component Value Date/Time   CHOL 202* 01/29/2014 12:04 PM    No components found with this basename: hga1c    Lab Results  Component Value Date/Time   AST 18 01/29/2014 12:04 PM    Assessment and Plan  RUQ pain - Plan: US Abdomen Complete  Screening - Plan: MM DIGITAL SCREENING BILATERAL  High triglycerides - Plan: Advised for low-fat diet, repeat Lipid panel  IFG (impaired fasting glucose) - Plan: Advised for low carbohydrate diet, check COMPLETE  METABOLIC PANEL WITH GFR   Health Maintenance  -Mammogram: ordered  -Vaccinations:    -Influenza shot today   Return in about 3 months (around 11/09/2014) for hyperipidemia, IFG.  Doris CheadleADVANI, Arie Gable, MD

## 2014-08-09 NOTE — Patient Instructions (Signed)
Dieta para el control del colesterol y las grasas  (Fat and Cholesterol Control Diet) Los niveles de grasa y colesterol en la sangre y en los rganos se ven influidos por la dieta. Los niveles altos de grasa y colesterol pueden conducir a enfermedades del corazn, de los pequeos y los grandes vasos sanguneos, de la vescula biliar, el hgado y el pncreas.  CONTROL DE LA GRASA Y EL COLESTEROL CON LA DIETA  Aunque el ejercicio y el estilo de vida son factores importantes, su dieta es la clave. Esto se debe a que se sabe que ciertos alimentos hacen subir el colesterol y otros lo bajan. El objetivo debe ser equilibrar los alimentos, de modo que tengan un efecto sobre el colesterol y, an ms importante, reemplazar las grasas saturadas y trans con otros tipos de grasas, como las monoinsaturadas y las poliinsaturadas y cidos grasos omega-3.  En promedio, una persona no debe consumir ms de 15 a 17 g de grasas saturadas por da. Las grasas saturadas y trans se consideran grasas "malas", ya que elevan el colesterol LDL. Las grasas saturadas se encuentran principalmente en productos animales como carne, manteca y crema. Sin embargo, eso no significa que tenga que renunciar a todas sus comidas favoritas. Actualmente, hay buenos sustitutos bajos en colesterol, bajos en grasas para la mayora de las cosas que le gusta comer. Elija aquellos alimentos alternativos que sean bajos en grasas o sin grasas. Elija cortes de peceto o lomo de carne roja. Estos tipos de cortes contienen menos grasa y colesterol. Pollo (sin la piel), pescado, ternera y pechuga de pavo molida son excelentes opciones. Eliminar las carnes grasas, como las salchichas y el salame. Los mariscos contienen poca o casi nada de grasas saturadas. Consuma una porcin de 3 oz (85 g) de carne magra, aves o pescado.  Las grasas trans tambin se llaman "aceites parcialmente hidrogenados". Son aceites manipulados cientficamente de modo que son slidos a  temperatura ambiente, tienen una larga vida y mejoran el sabor y la textura de los alimentos a los que se agregan. Las grasas trans se encuentran en la margarina, masitas, crackers y alimentos horneados.  Al hornear y cocinar, el aceite es un buen sustituto de la manteca. Los aceites monoinsaturados son beneficiosos, sobre todo porque se cree que reducen el colesterol LDL y aumentan el HDL. Los aceites que hay que evitar completamente son los aceites tropicales saturados, como el de coco y palma.  Recuerde consumir una gran cantidad de alimentos de los grupos que estn naturalmente libres de grasas saturadas y grasas trans, e incluya pescado, frutas, verduras, frijoles, granos (cebada, arroz, cuscs, trigo bulgur) y pastas (sin salsas de crema).  IDENTIFICACIN DE LOS ALIMENTOS QUE REDUCEN LAS GRASAS Y ELCOLESTEROL  La fibra soluble puede reducir el colesterol. Este tipo de fibra se encuentra en las frutas como manzanas, verduras como el brcoli, papas y zanahorias, las legumbres como los frijoles, guisantes y lentejas y granos como la cebada. Los alimentos enriquecidos con esteroles vegetales (fitosteroles) tambin pueden reducir el colesterol. Consuma al menos 2 g por da de estos alimentos para un efecto de disminucin del colesterol.  Lea las etiquetas de los paquetes para identificar los alimentos bajos en grasas saturadas, en grasas trans y los bajos en grasas en el supermercado. Seleccione los quesos que tienen slo 2 a 3 g de grasa saturada por onza. Utilice margarina saludable para el corazn que sea libre de grasas trans o aceites parcialmente hidrogenados. Al comprar productos de panadera (galletas, crackers), se   deben evitar los aceites parcialmente hidrogenados. Panes y panecillos deben hacerse con cereales integrales (trigo integral o harina de avena integral en lugar de " harina " o " harina enriquecida ") Compre sopas en lata que no sean cremosas, con bajo contenido de sal y sin grasas  adicionadas.  TCNICAS DE PREPARACIN DE LOS ALIMENTOS  Nunca prepare los alimentos fritos. Si usted debe frer, saltee los alimentos en muy poca grasa o use un aerosol de cocina anti adherente. Siempre que sea posible, debe hervir, hornear o asar las carnes y preparar las verduras al vapor. En lugar de agregar mantequilla o margarina a las verduras, use limn y hierbas, pur de manzana y canela (para la calabaza y la batata). Utilice yogur natural sin grasa, salsas y aderezos bajos en grasa para ensaladas.  BAJO EN GRASAS SATURADAS / SUSTITUTOS BAJOS EN GRASA  Carnes / grasas saturadas (g)  Evite: Bife, veteado (3 oz/85 g) / 11 g  Elija: Bife, magro(3 oz/85 g) / 4 g  Evite: Hamburguesa (3 oz/85 g) / 7 g  Elija: Hamburguesa, magra (3 oz/85 g) / 5 g  Evite: Jamn (3 oz/85 g) / 6 g  Elija: Jamn, corte magro (3 oz/85 g) / 2,4 g  Evite: Pollo con piel, carne oscura (3 oz/85 g) / 4 g  Elija: Pollo, sin piel, carne oscura (3 oz/85 g) / 2 g  Evite: Pollo con piel, carne blanca (3 oz/85 g) / 2,5 g  Elija: Pollo, sin piel, carne blanca (3 oz/85 g) / 1 g Lcteos / Grasa saturada (g)   Evite: Leche entera (1 taza) / 5 g  Elija: Leche descremada, 2% (1 taza) / 3 g  Elija: Leche descremada, 1% (1 taza) / 1,5 g  Elija: Leche descremada, 1 taza (0,3 g).  Evite: Queso duro (1 oz/28 g) / 6 g  Elija: Queso de leche descremada (1 oz/28 g) / 2 a 3 g  Evite: Queso cottage, 4% de grasa (1 taza) / 6,5 g  Elija: Queso cottage bajo en grasa, 1% de grasa (1 taza) / 1,5 g  Evite: Helado (1 taza) / 9 g  Elija: Sorbete (1 taza) / 2,5 g  Elija: Yogur congelado descremado (1 taza) / 0,3 g  Elija: Barra de frutas congeladas / trace  Evite: Crema batida (1 cucharada) / 3,5 g  Elija: Crema batida no lctea (1 cucharada) / 1 g Condimentos / Grasas Saturadas (g)   Evite: Mayonesa (1 cucharada) / 2 g  Elija: Mayonesa baja en grasa (1 cucharada) / 1 g  Evite: Mantequilla (1 cucharada) / 7  g  Elija: Margarina light extra (1 cucharada) / 1 g  Evite: Aceite de coco (1 cucharada) / 11,8 g  Elija: Aceite de oliva (1 cucharada) / 1,8 g  Elija: Aceite de maz (1 cucharada) / 1,7 g  Elija: Aceite de crtamo (1 cucharada) / 1,2 g  Elija: Aceite de girasol (1 cucharada) / 1,4 g  Elija: Aceite de soja (1 cucharada) / 0 mg / 2,4 g  Elija: Aceite de canola (1 cucharada) / 0 mg / 1 g Document Released: 10/19/2005 Document Revised: 02/13/2013 ExitCare Patient Information 2015 [employer]. This information is not intended to replace advice given to you by your health care provider. Make sure you discuss any questions you have with your health care provider.  

## 2014-08-09 NOTE — Progress Notes (Signed)
Patient in with abd pain, going on since Monday.  Was severe Monday but has gotten less each day since.

## 2014-08-10 ENCOUNTER — Other Ambulatory Visit: Payer: Self-pay | Admitting: Internal Medicine

## 2014-08-10 ENCOUNTER — Telehealth: Payer: Self-pay | Admitting: *Deleted

## 2014-08-10 DIAGNOSIS — R740 Nonspecific elevation of levels of transaminase and lactic acid dehydrogenase [LDH]: Principal | ICD-10-CM

## 2014-08-10 DIAGNOSIS — R7401 Elevation of levels of liver transaminase levels: Secondary | ICD-10-CM

## 2014-08-10 NOTE — Telephone Encounter (Signed)
Pt aware of lab results, will make apt in two weeks

## 2014-08-10 NOTE — Telephone Encounter (Signed)
Message copied by Dyann KiefGIRALDEZ, Ashiyah Pavlak M on Fri Aug 10, 2014 11:30 AM ------      Message from: Doris CheadleADVANI, DEEPAK      Created: Fri Aug 10, 2014 10:16 AM       Blood work reviewed noticed abnormal liver function test, advise patient to avoid alcohol and Tylenol, I have ordered hepatitis panel advise patient to do the blood work in 2 weeks. ------

## 2014-08-17 ENCOUNTER — Ambulatory Visit: Payer: Self-pay | Attending: Internal Medicine

## 2014-08-17 ENCOUNTER — Ambulatory Visit (HOSPITAL_COMMUNITY)
Admission: RE | Admit: 2014-08-17 | Discharge: 2014-08-17 | Disposition: A | Discharge status: Home / Self Care | Payer: Self-pay | Source: Ambulatory Visit | Attending: Internal Medicine | Admitting: Internal Medicine

## 2014-08-17 ENCOUNTER — Telehealth: Payer: Self-pay | Admitting: Internal Medicine

## 2014-08-17 VITALS — BP 128/86 | HR 72 | Temp 98.0°F | Resp 16

## 2014-08-17 DIAGNOSIS — K8080 Other cholelithiasis without obstruction: Secondary | ICD-10-CM | POA: Insufficient documentation

## 2014-08-17 DIAGNOSIS — R1011 Right upper quadrant pain: Secondary | ICD-10-CM

## 2014-08-17 DIAGNOSIS — R7401 Elevation of levels of liver transaminase levels: Secondary | ICD-10-CM

## 2014-08-17 DIAGNOSIS — R740 Nonspecific elevation of levels of transaminase and lactic acid dehydrogenase [LDH]: Secondary | ICD-10-CM | POA: Insufficient documentation

## 2014-08-17 NOTE — Progress Notes (Unsigned)
Patient here for follow up blood work.

## 2014-08-17 NOTE — Telephone Encounter (Signed)
[facility] speak to nurse to provide results from patients [facility]. Tried transferring to nurse line, line was busy. Informed Cassandra that the results were available to us via EPIC (under imaging tab) and that I would be putting a note in for nurse/doctor to read results and follow up with patient accordingly. Please follow up.

## 2014-08-18 LAB — HEPATITIS B SURFACE ANTIGEN: Hepatitis B Surface Ag: NEGATIVE

## 2014-08-18 LAB — HEPATITIS C ANTIBODY: HCV AB: NEGATIVE

## 2014-08-18 LAB — HEPATITIS B SURFACE ANTIBODY,QUALITATIVE: HEP B S AB: POSITIVE — AB

## 2014-08-20 ENCOUNTER — Telehealth: Payer: Self-pay | Admitting: Emergency Medicine

## 2014-08-20 ENCOUNTER — Other Ambulatory Visit: Payer: Self-pay | Admitting: Emergency Medicine

## 2014-08-20 DIAGNOSIS — K810 Acute cholecystitis: Secondary | ICD-10-CM

## 2014-08-20 NOTE — Telephone Encounter (Signed)
After speaking with Dr. Orpah CobbAdvani, spoke with pt regarding pain sx's. Pt denies abdominal discomfort,n,v or fevers per Spanish interpretor. Instructed pt to go to ER if develop sx's. Urgent referral placed for General Surgery Pt verbalized understanding of U/S report

## 2014-08-20 NOTE — Progress Notes (Signed)
Pt denies any sx's of abdominal pain,fever,chills,n,v Instructed to go to ER if develop Urgent General Surgery referral placed

## 2014-10-09 ENCOUNTER — Ambulatory Visit (HOSPITAL_COMMUNITY)
Admission: RE | Admit: 2014-10-09 | Discharge: 2014-10-09 | Disposition: A | Discharge status: Home / Self Care | Payer: Self-pay | Source: Ambulatory Visit | Attending: Internal Medicine | Admitting: Internal Medicine

## 2014-10-09 DIAGNOSIS — Z139 Encounter for screening, unspecified: Secondary | ICD-10-CM

## 2015-12-12 ENCOUNTER — Encounter: Payer: Self-pay | Admitting: Internal Medicine

## 2015-12-12 ENCOUNTER — Ambulatory Visit: Payer: Self-pay | Attending: Internal Medicine | Admitting: Internal Medicine

## 2015-12-12 VITALS — BP 110/72 | HR 82 | Temp 98.5°F | Resp 16 | Ht 62.0 in | Wt 159.4 lb

## 2015-12-12 DIAGNOSIS — Z0001 Encounter for general adult medical examination with abnormal findings: Secondary | ICD-10-CM | POA: Insufficient documentation

## 2015-12-12 DIAGNOSIS — R7301 Impaired fasting glucose: Secondary | ICD-10-CM

## 2015-12-12 DIAGNOSIS — K219 Gastro-esophageal reflux disease without esophagitis: Secondary | ICD-10-CM | POA: Insufficient documentation

## 2015-12-12 DIAGNOSIS — Z79899 Other long term (current) drug therapy: Secondary | ICD-10-CM | POA: Insufficient documentation

## 2015-12-12 DIAGNOSIS — R519 Headache, unspecified: Secondary | ICD-10-CM

## 2015-12-12 DIAGNOSIS — R51 Headache: Secondary | ICD-10-CM | POA: Insufficient documentation

## 2015-12-12 LAB — CBC WITH DIFFERENTIAL/PLATELET
Basophils Absolute: 0 10*3/uL (ref 0.0–0.1)
Basophils Relative: 0 % (ref 0–1)
EOS ABS: 0.2 10*3/uL (ref 0.0–0.7)
Eosinophils Relative: 2 % (ref 0–5)
HEMATOCRIT: 38.5 % (ref 36.0–46.0)
Hemoglobin: 12.6 g/dL (ref 12.0–15.0)
LYMPHS ABS: 2.8 10*3/uL (ref 0.7–4.0)
LYMPHS PCT: 35 % (ref 12–46)
MCH: 30.4 pg (ref 26.0–34.0)
MCHC: 32.7 g/dL (ref 30.0–36.0)
MCV: 93 fL (ref 78.0–100.0)
MONOS PCT: 8 % (ref 3–12)
MPV: 11.4 fL (ref 8.6–12.4)
Monocytes Absolute: 0.6 10*3/uL (ref 0.1–1.0)
Neutro Abs: 4.5 10*3/uL (ref 1.7–7.7)
Neutrophils Relative %: 55 % (ref 43–77)
PLATELETS: 316 10*3/uL (ref 150–400)
RBC: 4.14 MIL/uL (ref 3.87–5.11)
RDW: 13.5 % (ref 11.5–15.5)
WBC: 8.1 10*3/uL (ref 4.0–10.5)

## 2015-12-12 LAB — POCT GLYCOSYLATED HEMOGLOBIN (HGB A1C): Hemoglobin A1C: 5.9

## 2015-12-12 LAB — BASIC METABOLIC PANEL
BUN: 13 mg/dL (ref 7–25)
CO2: 26 mmol/L (ref 20–31)
CREATININE: 0.7 mg/dL (ref 0.50–1.10)
Calcium: 9.1 mg/dL (ref 8.6–10.2)
Chloride: 104 mmol/L (ref 98–110)
GLUCOSE: 80 mg/dL (ref 65–99)
Potassium: 4.7 mmol/L (ref 3.5–5.3)
Sodium: 137 mmol/L (ref 135–146)

## 2015-12-12 LAB — LIPID PANEL
CHOLESTEROL: 155 mg/dL (ref 125–200)
HDL: 34 mg/dL — ABNORMAL LOW (ref >=46)
LDL Cholesterol: 94 mg/dL (ref <130)
TRIGLYCERIDES: 135 mg/dL (ref <150)
Total CHOL/HDL Ratio: 4.6 Ratio (ref <=5.0)
VLDL: 27 mg/dL (ref <30)

## 2015-12-12 LAB — GLUCOSE, POCT (MANUAL RESULT ENTRY): POC GLUCOSE: 132 mg/dL — AB (ref 70–99)

## 2015-12-12 MED ORDER — RANITIDINE HCL 150 MG PO TABS: 150.0000 mg | ORAL_TABLET | Freq: Two times a day (BID) | ORAL | Status: DC

## 2015-12-12 MED FILL — raNITIdine HCL 150 MG TABS: 150 | 30 days supply | Qty: 60 | Fill #0

## 2015-12-12 NOTE — Patient Instructions (Signed)
Excedrin Migraines Opciones de alimentos para pacientes con reflujo gastroesofgico - Adultos (Food Choices for Gastroesophageal Reflux Disease, Adult) Cuando se tiene reflujo gastroesofgico (ERGE), los alimentos que se ingieren y los hbitos de alimentacin son muy importantes. Elegir los alimentos adecuados puede ayudar a Paramedic las molestias ocasionadas por el [place]. QU PAUTAS GENERALES DEBO SEGUIR?  Elija las frutas, los vegetales, los cereales integrales, los productos lcteos, la carne de [place], de pescado y de ave con bajo contenido de grasas.  Limite las grasas, [address], los aderezos para [place], la [place], los frutos secos y Programme researcher, broadcasting/film/video.  Lleve un registro de las comidas para identificar los alimentos que ocasionan sntomas.  Evite los alimentos que le ocasionen reflujo. Pueden ser distintos para cada persona.  Haga comidas pequeas con frecuencia en lugar de tres comidas [employer].  Coma lentamente, en un clima distendido.  Limite el consumo de alimentos fritos.  Cocine los alimentos utilizando mtodos que no sean la fritura.  Evite el consumo alcohol.  Evite beber grandes cantidades de lquidos con las comidas.  Evite agacharse o recostarse hasta despus de 2 o 3horas de haber comido. QU ALIMENTOS NO SE RECOMIENDAN? Los siguientes son algunos alimentos y bebidas que pueden empeorar los sntomas: Veterinary surgeon. Jugo de tomate. Salsa de tomate y espagueti. Ajes. [place] y [place]. Rbano picante. Frutas Naranjas, pomelos y limn (fruta y [place]). Carnes Carnes de [place], de pescado y de ave con gran contenido de grasas. Esto incluye los perros calientes, las [place], el [place], la salchicha, el salame y el tocino. Lcteos Leche entera y [place]. [employer]. Crema. Mantequilla. Helados. Queso crema.  Bebidas Caf y t negro, con o sin cafena Bebidas gaseosas o energizantes. Condimentos Salsa picante. Salsa barbacoa.   Dulces/postres Chocolate y cacao. Rosquillas. Menta y mentol. Grasas y [employer] con alto contenido de grasas, incluidas las papas fritas. Otros Vinagre. Especias picantes, como la [employer], la pimienta blanca, la pimienta roja, la pimienta de cayena, el curry en [place], los clavos de [place], el jengibre y el [place] en polvo. Los artculos mencionados arriba pueden no ser [employer] de las bebidas y los alimentos que se Theatre stage manager. Comunquese con el nutricionista para recibir ms informacin.   Esta informacin no tiene Theme park manager el consejo del mdico. Asegrese de hacerle al mdico cualquier pregunta que tenga.   Document Released: 07/29/2005 Document Revised: 11/09/2014 Elsevier Interactive Patient Education [employer]

## 2015-12-12 NOTE — Progress Notes (Signed)
Patient states she was sick last week with some vomiting Patient states she is feeling better but still having headaches

## 2015-12-12 NOTE — Progress Notes (Signed)
Patient ID: Carrie Buchanan, female   DOB: 09-22-1976, 40 y.o.   MRN: [id number]  CC: headaches  HPI: Carrie Buchanan is a 40 y.o. female here today for a follow up visit.  Patient has no past medical history. Patient states that last week she suffered from headaches, nausea, and vomiting. Today she reports resolution of nausea and vomiting but continued headaches. Headaches every 3 days that is a dull ache in the occipital region. Headaches started last week. During headaches she feels like she has heart palpitations. She usually drinks coke to help with headaches. She has not tried anything to help with headaches.  Patient reports that she has been having epigastric burning with bloating after every meal for one week as well.    No Known Allergies Past Medical History  Diagnosis Date  . Medical history non-contributory   . Appendicitis, acute, with peritonitis 02/20/2013   Current Outpatient Prescriptions on File Prior to Visit  Medication Sig Dispense Refill  . Ascorbic Acid (VITAMIN C) 100 MG tablet Take 100 mg by mouth daily.    . Multiple Vitamins-Minerals (MULTIVITAMIN WITH MINERALS) tablet Take 1 tablet by mouth daily.     No current facility-administered medications on file prior to visit.   Family History  Problem Relation Age of Onset  . Asthma Mother    Social History   Social History  . Marital Status: Married    Spouse Name: N/A  . Number of Children: N/A  . Years of Education: N/A   Occupational History  . Not on file.   Social History Main Topics  . Smoking status: Never Smoker   . Smokeless tobacco: Never Used  . Alcohol Use: No  . Drug Use: No  . Sexual Activity: Not Currently    Birth Control/ Protection: IUD   Other Topics Concern  . Not on file   Social History Narrative    Review of Systems: Other than what is stated in HPI, all other systems are negative.   Objective:   Filed Vitals:   12/12/15 1001  BP: 110/72  Pulse: 82  Temp:  98.5 F (36.9 C)  Resp: 16    Physical Exam  Constitutional: She is oriented to person, place, and time.  Cardiovascular: Normal rate, regular rhythm and normal heart sounds.   Pulmonary/Chest: Effort normal and breath sounds normal.  Abdominal: Soft. Bowel sounds are normal. She exhibits no distension. There is tenderness (epigastric).  Neurological: She is alert and oriented to person, place, and time. No cranial nerve deficit.  Skin: Skin is warm and dry.  Psychiatric: She has a normal mood and affect.     Lab Results  Component Value Date   WBC 11.0* 01/29/2014   HGB 13.8 01/29/2014   HCT 40.6 01/29/2014   MCV 90.4 01/29/2014   PLT 393 01/29/2014   Lab Results  Component Value Date   CREATININE 0.69 08/09/2014   BUN 15 08/09/2014   NA 138 08/09/2014   K 3.8 08/09/2014   CL 103 08/09/2014   CO2 25 08/09/2014    No results found for: HGBA1C Lipid Panel     Component Value Date/Time   CHOL 151 08/09/2014 1144   TRIG 127 08/09/2014 1144   HDL 38* 08/09/2014 1144   CHOLHDL 4.0 08/09/2014 1144   VLDL 25 08/09/2014 1144   LDLCALC 88 08/09/2014 1144       Assessment and plan:   Carrie Buchanan was seen today for follow-up.  Diagnoses and all orders for this visit:  Gastroesophageal reflux disease, esophagitis presence not specified -     Begin ranitidine (ZANTAC) 150 MG tablet; Take 1 tablet (150 mg total) by mouth 2 (two) times daily. Discussed diet and weight with patient relating to acid reflux.  Went over things that may exacerbate acid reflux such as tomatoes, spicy foods, coffee, carbonated beverages, chocolates, etc.  Advised patient to avoid laying down at least two hours after meals and sleep with HOB elevated.   Nonintractable headache, unspecified chronicity pattern, unspecified headache type -     Basic Metabolic Panel -     CBC with Differential May take some Excedrin Migraine for headaches. If they continue to persist daily she may return in 6 weeks for  further evaluation. Stress management discussed   IFG (impaired fasting glucose) -     POCT glycosylated hemoglobin (Hb A1C) -     POCT glucose (manual entry) -     Lipid panel  Due to language barrier, an interpreter was present during the history-taking and subsequent discussion (and for part of the physical exam) with this patient.  Return if symptoms worsen or fail to improve.       Ambrose Finland, NP-C [facility] [phone] 12/12/2015, 10:16 AM

## 2015-12-16 ENCOUNTER — Telehealth: Payer: Self-pay

## 2015-12-16 NOTE — Telephone Encounter (Signed)
-----   Message from Ambrose Finland, NP sent at 12/16/2015  8:55 AM EST ----- Labs are normal

## 2015-12-16 NOTE — Telephone Encounter (Signed)
Interpreter line used Carrie Buchanan ID# [phone] Called patient and she is aware her labs were normal

## 2016-01-02 DIAGNOSIS — Z139 Encounter for screening, unspecified: Secondary | ICD-10-CM

## 2016-01-02 NOTE — Congregational Nurse Program (Signed)
Congregational Nurse Program Note  Date of Encounter: 01/02/2016  Past Medical History: Past Medical History  Diagnosis Date  . Medical history non-contributory   . Appendicitis, acute, with peritonitis 02/20/2013    Encounter Details:     CNP Questionnaire - 01/02/16 1326    Patient Demographics   Is this a new or existing patient? New   Race Latino/Hispanic   Patient Assistance   Patient's financial/insurance status Orange Research officer, trade union   Uninsured Patient No   Patient referred to apply for the following financial assistance Not Applicable   Food insecurities addressed Not Applicable   Transportation assistance No   Assistance securing medications No   Educational health offerings Other;Nutrition;Cardiac disease   Encounter Details   Primary purpose of visit Education/Health Concerns   Was an Emergency Department visit averted? No   Does patient have a medical provider? Yes   Patient referred to Not Applicable   Was a mental health screening completed? (GAINS tool) Yes   Was a mental health referral made? No   Does patient have dental issues? No   Was a dental referral made? No   Does patient have vision issues? No   Since previous encounter, have you referred patient for abnormal blood pressure that resulted in a new diagnosis or medication change? No   Since previous encounter, have you referred patient for abnormal blood glucose that resulted in a new diagnosis or medication change? No         Amb Nursing Assessment - 12/12/15 1002    Pre-visit preparation   Pre-visit preparation completed Yes   Pain Assessment   Pain Assessment No/denies pain     Patient seen at [facility], B/P 107/74, Pulse 68. Patient states within the last week she has experienced mild bilateral feet swelling, provided education on sodium reduction and increased water intake, she will continue to monitor and call PCP if conditions worsen. Patient also states swelling  did not occur until warm weather arrived.  No other concerns at this time other than she mentioned she started having headaches with N/V since starting on birth control in 1998, she has followed up with PCP on these issues. Delfin Gant, [employer] student performed GAINS screening tool - scored of 2, no mental health issues or concerns noted. Fransisco Beau, RN, [place] [phone]

## 2016-01-20 ENCOUNTER — Ambulatory Visit: Payer: Self-pay | Attending: Internal Medicine

## 2016-06-03 ENCOUNTER — Ambulatory Visit: Payer: Self-pay | Attending: Internal Medicine | Admitting: Internal Medicine

## 2016-06-03 ENCOUNTER — Encounter: Payer: Self-pay | Admitting: Internal Medicine

## 2016-06-03 VITALS — BP 107/73 | HR 76 | Temp 98.4°F | Resp 16 | Wt 157.8 lb

## 2016-06-03 DIAGNOSIS — H538 Other visual disturbances: Secondary | ICD-10-CM

## 2016-06-03 DIAGNOSIS — M791 Myalgia, unspecified site: Secondary | ICD-10-CM

## 2016-06-03 DIAGNOSIS — R7303 Prediabetes: Secondary | ICD-10-CM

## 2016-06-03 DIAGNOSIS — R202 Paresthesia of skin: Secondary | ICD-10-CM

## 2016-06-03 LAB — TSH: TSH: 2.67 m[IU]/L

## 2016-06-03 MED ORDER — AMITRIPTYLINE HCL 25 MG PO TABS: 25.0000 mg | ORAL_TABLET | Freq: Every day | ORAL | 3 refills | Status: DC

## 2016-06-03 MED ORDER — METFORMIN HCL ER 500 MG PO TB24: 500.0000 mg | ORAL_TABLET | Freq: Every day | ORAL | 3 refills | Status: DC

## 2016-06-03 MED FILL — AMITRIPTYLINE HCL 25 MG TAB: 25 | 30 days supply | Qty: 30 | Fill #0

## 2016-06-03 MED FILL — METFORMIN HCL ER 500 MG TAB: 500 | 30 days supply | Qty: 30 | Fill #0

## 2016-06-03 NOTE — Patient Instructions (Addendum)
Dolor de espalda en adultos (Back Pain, Adult) El dolor de espalda es muy frecuente en los adultos.La causa del dolor de espalda es rara vez peligrosa y Conservation officer, historic buildings a menudo mejora con el [place].Es posible que se desconozca la causa de esta afeccin. Algunas causas comunes son las siguientes:  Distensin de los msculos o ligamentos que sostienen la columna vertebral.  Holiday representative (degeneracin) de los discos vertebrales.  Artritis.  Lesiones directas en la espalda. En [employer], el dolor de espalda es recurrente. Como rara vez es peligroso, las personas pueden aprender a Holiday representative afeccin por s mismas. INSTRUCCIONES PARA EL CUIDADO EN EL HOGAR Controle su dolor de espalda a fin de Recruitment consultant cambio. Las siguientes indicaciones ayudarn a Chief Strategy Officer que pueda sentir:  IT consultant. Si permanece sentado o de pie en un mismo lugar durante mucho tiempo, se tensiona la espalda. No se siente, conduzca o permanezca de pie en un mismo lugar durante ms de 30 minutos seguidos. Realice caminatas cortas en superficies planas tan pronto como le sea posible.Trate de caminar un poco ms de Publishing copy.  Haga ejercicio regularmente como se lo haya indicado el mdico. El ejercicio ayuda a que su espalda se cure ms rpidamente. Tambin ayuda a prevenir futuras lesiones al [employer] fuertes y flexibles.  No permanezca en la cama.Si hace reposo ms de 1 a 2 das, puede demorar su recuperacin.  Preste atencin a su cuerpo al inclinarse y levantarse. Las posiciones ms cmodas son las que ejercen menos tensin en la espalda en recuperacin. Siempre use tcnicas apropiadas para levantar objetos, como por ejemplo:  Flexionar las rodillas.  Mantener la carga cerca del cuerpo.  No torcerse.  Encuentre una posicin cmoda para dormir. Use un colchn firme y recustese de costado con las rodillas ligeramente flexionadas. Si se recuesta [employer], coloque  una almohada debajo de las rodillas.  Evite sentir ansiedad o estrs.El estrs aumenta la tensin muscular y puede empeorar el dolor de espalda.Es importante reconocer si se siente ansioso o estresado y aprender maneras de controlarlo, por ejemplo haciendo ejercicio.  Tome los medicamentos solamente como se lo haya indicado el mdico. Los medicamentos de venta libre para Best boy y la inflamacin a menudo son los ms eficaces.El mdico puede recetarle relajantes musculares.Estos medicamentos ayudan a Glass blower/designer de modo que pueda reanudar ms rpidamente sus actividades normales y el ejercicio saludable.  Aplique hielo sobre la zona lesionada.  Ponga el hielo en una bolsa plstica.  Coloque una toalla entre la piel y la bolsa de hielo.  Deje el hielo durante 43minutos, 2 a 3veces por da, durante los primeros 2 o 3das. Despus de eso, puede alternar el hielo y el calor para reducir Conservation officer, historic buildings y los espasmos.  Mantenga un peso saludable. El exceso de peso ejerce presin adicional sobre la espalda y hace que resulte difcil mantener una buena [place]. SOLICITE ATENCIN MDICA SI:  Siente un dolor que no se alivia con reposo o medicamentos.  Siente mucho dolor que se extiende a las piernas o los glteos.  El dolor no mejora en una semana.  Siente dolor por la noche.  Pierde peso.  Siente escalofros o fiebre. SOLICITE ATENCIN MDICA DE INMEDIATO SI:   Tiene nuevos problemas para controlar la vejiga o los intestinos.  Siente debilidad o adormecimiento inusuales en los brazos o en las piernas.  Siente nuseas o vmitos.  Siente dolor abdominal.  Siente que va a desmayarse.  Esta informacin no tiene Theme park manager el consejo del mdico. Asegrese de hacerle al mdico cualquier pregunta que tenga.   Document Released: 10/19/2005 Document Revised: 11/09/2014 Elsevier Interactive Patient Education 2016 [employer].  -  Ejercicios para la  espalda (Back Exercises) Si tiene dolor de espalda, haga estos ejercicios 2 o 3veces por da, o como se lo haya indicado el mdico. Cuando el dolor desaparezca, hgalos una vez por da, pero haga ms repeticiones de cada ejercicio. Si no le duele la espalda, haga estos ejercicios una vez por da o como se lo haya indicado el mdico. EJERCICIOS Rodilla al pecho Repita estos pasos 3 o 5veces seguidas con cada pierna: 1. Acustese boca arriba sobre una cama dura o sobre el suelo con las piernas extendidas. 2. Lleve una rodilla al pecho. 3. Mantenga la rodilla contra el pecho. Para lograrlo tmese la rodilla o el muslo. 4. Tire de la rodilla hasta sentir una elongacin suave en la parte baja de la espalda. 5. Mantenga la elongacin durante 10 a 30segundos. 6. Suelte y extienda la pierna lentamente. Inclinacin de la pelvis Repita estos pasos 5 o 10veces seguidas: 1. Acustese boca arriba sobre una cama dura o sobre el suelo con las piernas extendidas. 2. Flexione las rodillas de manera que apunten al techo. Los pies deben estar apoyados en el suelo. 3. Contraiga los msculos de la parte baja del vientre (abdomen) para empujar la zona lumbar contra el suelo. Este movimiento har que el cccix apunte hacia el techo, en lugar de apuntar hacia abajo en direccin a los pies o al suelo. 4. Mantenga esta posicin durante 5 a 10segundos mientras contrae suavemente los msculos y respira con normalidad. El perro y el gato Repita estos pasos hasta que la zona lumbar se curve con ms facilidad: 1. Apoye las palmas de las manos y las rodillas sobre una superficie firme. Las manos deben estar alineadas con los hombros y las rodillas con las caderas. Puede colocarse almohadillas debajo de las rodillas. 2. Deje caer la cabeza y lleve el cccix hacia abajo de modo que apunte en direccin al suelo para que la zona lumbar se arquee como el lomo de un gato [place]. 3. Mantenga esta posicin durante  5segundos. 4. Lentamente, levante la cabeza y lleve el cccix hacia arriba de modo que apunte en direccin al techo para que la espalda se arquee (hunda) como el lomo de un perro contento. 5. Mantenga esta posicin durante 5segundos. Flexiones de brazos Repita estos pasos 5 o 10veces seguidas: 1. Acustese boca abajo en el suelo. 2. Ponga las manos cerca de la cabeza, separadas aproximadamente al ancho de los hombros. 3. Con la espalda relajada y las caderas apoyadas en el suelo, extienda lentamente los brazos para levantar la mitad superior del cuerpo y Optometrist los hombros. No use los msculos de la espalda. Para estar ms cmodo, puede cambiar la [employer]. 4. Mantenga esta posicin durante 5segundos. 5. Lentamente vuelva a la posicin horizontal. Puentes Repita estos pasos 10veces seguidas: 1. Acustese boca arriba sobre una superficie firme. 2. Flexione las rodillas de manera que apunten al techo. Los pies deben estar apoyados en el suelo. 3. Contraiga los glteos y despegue las nalgas del suelo hasta que la cintura est casi a la altura de las rodillas. Si no siente el trabajo muscular en las nalgas y la parte posterior de los muslos, aleje los pies 1 o 2pulgadas (2,5 o 5centmetros) de las nalgas. 4. Mantenga esta posicin durante  3 a 5segundos. 5. Lentamente, vuelva a apoyar las nalgas en el suelo y relaje los glteos. Si este ejercicio le resulta muy fcil, intente realizarlo con los brazos cruzados [place]. Abdominales Repita estos pasos 5 o 10veces seguidas: 1. Acustese boca arriba sobre una cama dura o sobre el suelo con las piernas extendidas. 2. Flexione las rodillas de manera que apunten al techo. Los pies deben estar apoyados en el suelo. 3. Cruce los [employer]. 4. Baje levemente el mentn en direccin al pecho, pero no doble el cuello. 5. Contraiga los msculos del abdomen y con lentitud eleve el pecho lo suficiente como para despegar  levemente los omplatos del suelo. 6. Lentamente baje el pecho y la cabeza hasta el suelo. Elevaciones de espalda Repita estos pasos 5 o 10veces seguidas: 1. Acustese boca abajo con los brazos a los costados y apoye la frente en el suelo. 2. Contraiga los msculos de las piernas y los glteos. 3. Lentamente despegue el pecho del suelo mientras mantiene las caderas apoyadas en el suelo. Mantenga la nuca alineada con la curvatura de la espalda. Mire hacia el suelo mientras hace este ejercicio. 4. Mantenga esta posicin durante 3 a 5segundos. 5. Lentamente baje el pecho y el rostro hasta el suelo. SOLICITE AYUDA SI:  El dolor de espalda se vuelve mucho ms intenso cuando hace un ejercicio.  El dolor de espalda no se [place] 2horas despus de [employer] ejercicios. Si tiene alguno de [employer], deje de [employer] ejercicios. No vuelva a hacer los ejercicios a menos que el mdico lo autorice. SOLICITE AYUDA DE INMEDIATO SI:  Siente un dolor sbito y muy intenso en la espalda. Si esto ocurre, deje de [employer]. No vuelva a hacer los ejercicios a menos que el mdico lo autorice.   Esta informacin no tiene Theme park manager el consejo del mdico. Asegrese de hacerle al mdico cualquier pregunta que tenga.   Document Released: 02/03/2011 Document Revised: 07/10/2015 Elsevier Interactive Patient Education 2016 [employer].  - La diabetes mellitus y los alimentos (Diabetes Mellitus and Food) Es importante que controle su nivel de azcar en la sangre (glucosa). El nivel de glucosa en sangre depende en gran medida de lo que usted come. Comer alimentos saludables en las cantidades [place] a lo largo del Futures trader, aproximadamente a la misma hora [employer], lo ayudar a Chief Operating Officer su nivel de Event organiser. Tambin puede ayudarlo a retrasar o Fish farm manager de la diabetes mellitus. Comer de [employer] saludable incluso puede ayudarlo a Event organiser de presin arterial  y a Barista o Pharmacologist un peso saludable.  Entre las recomendaciones generales para alimentarse y Water quality scientist los alimentos de forma saludable, se incluyen las siguientes:  Respetar las comidas principales y comer colaciones con regularidad. Evitar pasar largos perodos sin comer con el fin de perder peso.  Seguir una dieta que consista principalmente en alimentos de origen vegetal, como frutas, vegetales, frutos secos, legumbres y cereales integrales.  Utilizar mtodos de coccin a baja temperatura, como hornear, en lugar de mtodos de coccin a alta temperatura, como frer en abundante aceite. Trabaje con el nutricionista para aprender a Acupuncturist nutricional de las etiquetas de los alimentos. CMO PUEDEN AFECTARME LOS ALIMENTOS? Carbohidratos Los carbohidratos afectan el nivel de glucosa en sangre ms que cualquier otro tipo de alimento. El nutricionista lo ayudar a Chief Strategy Officer cuntos carbohidratos puede consumir en cada comida y ensearle a contarlos. El recuento de carbohidratos es importante  para Horticulturist, commercial en un nivel saludable, en especial si utiliza insulina o toma determinados medicamentos para la diabetes mellitus. Alcohol El alcohol puede provocar disminuciones sbitas de la glucosa en sangre (hipoglucemia), en especial si utiliza insulina o toma determinados medicamentos para la diabetes mellitus. La hipoglucemia es una afeccin que puede poner en peligro la vida. Los sntomas de la hipoglucemia (somnolencia, mareos y Administrator) son similares a los sntomas de haber consumido mucho alcohol.  Si el mdico lo autoriza a beber alcohol, hgalo con moderacin y siga estas pautas:  Las mujeres no deben beber ms de un trago por da, y los hombres no deben beber ms de dos tragos por Futures trader. Un trago es igual a:  12 onzas (355 ml) de cerveza  5 onzas de vino (150 ml) de vino  1,5onzas (35ml) de bebidas espirituosas  No beba con el estmago  vaco.  Mantngase hidratado. Beba agua, gaseosas dietticas o t helado sin azcar.  Las gaseosas comunes, los jugos y otros refrescos podran contener muchos carbohidratos y se Heritage manager. QU ALIMENTOS NO SE RECOMIENDAN? Cuando haga las elecciones de alimentos, es importante que recuerde que todos los alimentos son distintos. Algunos tienen menos nutrientes que otros por porcin, aunque podran tener la misma cantidad de caloras o carbohidratos. Es difcil darle al cuerpo lo que necesita cuando consume alimentos con menos nutrientes. Estos son algunos ejemplos de alimentos que debera evitar ya que contienen muchas caloras y carbohidratos, pero pocos nutrientes:  Neurosurgeon trans (la mayora de los alimentos procesados incluyen grasas trans en la etiqueta de Informacin nutricional).  Gaseosas comunes.  Jugos.  Caramelos.  Dulces, como tortas, pasteles, rosquillas y [place].  Comidas fritas. QU ALIMENTOS PUEDO COMER? Consuma alimentos ricos en nutrientes, que nutrirn el cuerpo y lo mantendrn saludable. Los alimentos que debe comer tambin dependern de varios factores, como:  Las caloras que necesita.  Los medicamentos que toma.  Su peso.  El nivel de glucosa en [place].  El [place] de presin arterial.  El nivel de colesterol. Debe consumir una amplia variedad de alimentos, por ejemplo:  Protenas.  Cortes de [employer].  Protenas con bajo contenido de grasas saturadas, como pescado, clara de huevo y frijoles. Evite las carnes procesadas.  Frutas y vegetales.  Frutas y Sports administrator que pueden ayudar a Chief Operating Officer los niveles sanguneos de [place], como [place], mangos y batatas.  Productos lcteos.  Elija productos lcteos sin grasa o con bajo contenido de [place], como [place], yogur y [place].  Cereales, panes, pastas y arroz.  Elija cereales integrales, como panes multicereales, avena en grano y arroz integral. Estos alimentos pueden ayudar a controlar la  presin arterial.  Rosalin [place].  Alimentos que contengan grasas saludables, como frutos secos, Chartered certified accountant, aceite de [place], aceite de canola y pescado. TODOS LOS QUE PADECEN DIABETES MELLITUS TIENEN EL MISMO PLAN DE COMIDAS? Dado que todas las personas que padecen diabetes mellitus son distintas, no hay un solo plan de comidas que funcione para todos. Es muy importante que se rena con un nutricionista que lo ayudar a crear un plan de comidas adecuado para usted.   Esta informacin no tiene Theme park manager el consejo del mdico. Asegrese de hacerle al mdico cualquier pregunta que tenga.   Document Released: 01/26/2008 Document Revised: 11/09/2014 Elsevier Interactive Patient Education 2016 [employer].  - Consejos para comer fuera de su casa si tiene diabetes (Tips for Eating Away From Home If You Have Diabetes) El control del nivel de glucemia, que tambin  se conoce como azcar en la sangre, puede ser un reto, que se complica an ms cuando uno no prepara sus propias comidas. Los siguientes consejos pueden ayudarlo a Chief Operating Officer la diabetes cuando come fuera de su casa. PLANIFICACIN Organcese si sabe que comer fuera de su casa:  Pregntele al mdico cmo sincronizar las comidas y el medicamento si est en tratamiento con insulina.  Haga una lista de restaurantes cercanos que ofrezcan opciones saludables. Si tienen un men que pueda leer en su casa, llvelo y planifique lo que pedir con anticipacin.  Busque informacin en lnea del restaurante donde quiera comer. Muchos restaurantes de comida rpida y cadenas de restaurantes incluyen la informacin nutricional en lnea. Tenga en cuenta esta informacin para elegir las opciones ms saludables y calcular los carbohidratos de la comida.  Use un libro de recuento de carbohidratos o una aplicacin mvil para fijarse en el contenido de carbohidratos y el tamao de porcin de lo que desea comer.  Comience a Armed forces training and education officer de las  porciones y a Public house manager cuntas porciones hay en una unidad. Esto le permitir calcular la cantidad de carbohidratos que puede comer. ALIMENTOS LIBRES Un "alimento libre" es cualquier alimento o bebida que contenga menos de 5g de carbohidratos por porcin. Entre los alimentos libres, se incluyen los siguientes:  Muchos vegetales.  Huevos duros.  Nueces o semillas.  Aceitunas.  Quesos.  Carnes. Estos tipos de alimentos son buenas opciones de bocadillos y en general estn disponibles en los bufs de ensaladas. Como aderezos "libres" para [place], puede usarse jugo de limn, vinagre o un aderezo de bajas caloras (con menos de 20caloras por porcin).  OPCIONES PARA REDUCIR LOS CARBOHIDRATOS  Reemplace el yogur descremado endulzado por el yogur sin azcar. Tambin puede consumir yogur a base de [place], pero es conveniente una opcin sin azcar o natural, porque tiene menos contenido de carbohidratos.  Pdale al mozo que retire la canasta de pan o las papas de la mesa.  Pida frutas frescas. El buf de ensaladas a menudo ofrece frutas frescas. Evite las frutas enlatadas, ya que por lo general tienen azcar o almbar.  Pida una ensalada y cmala sin aderezo. Tambin puede crear un aderezo "libre" para ensaladas.  Pida que le [employer] alimentos. Por ejemplo, en lugar de papas fritas, pida una porcin de vegetales, como una ensalada, judas verdes o brcoli. OTROS CONSEJOS   Si [place] insulina, adminstrela una vez que la comida llegue a la mesa, as las Automotive engineer.  Pregntele al mozo sobre el tamao de la porcin antes de pedir la comida y, si la porcin es ms grande de lo que usted debe consumir, pida una caja para llevarse la comida a su casa. Cuando llegue la comida, deje en el plato la cantidad que debe comer y coloque el resto en la caja para llevar.  Considere la posibilidad de Agricultural consultant un plato principal con alguien y de pedir una ensalada como  guarnicin.   Esta informacin no tiene Theme park manager el consejo del mdico. Asegrese de hacerle al mdico cualquier pregunta que tenga.   Document Released: 10/19/2005 Document Revised: 07/10/2015 Elsevier Interactive Patient Education [employer]  -  Diabetes y actividad fsica (Diabetes and Exercise) Hacer actividad fsica con regularidad es muy importante. No se trata solo de [employer]. Tiene muchos otros beneficios, como por ejemplo:  Mejorar el estado fsico, la flexibilidad y la resistencia.  Aumenta la densidad sea.  Ayuda a Art gallery manager.  Disminuye la  grasa corporal.  Aumenta la fuerza muscular.  Reduce el estrs y las tensiones.  Mejora el estado de salud general. Las personas diabticas que realizan actividad fsica tienen beneficios adicionales debido al ejercicio:  Reduce el apetito.  El organismo mejora el uso del azcar (glucosa) de la [place].  Ayuda a disminuir o Engineer, maintenance (IT).  Disminuye la presin arterial.  Ayuda a disminuir los lpidos en la sangre (colesterol y triglicridos).  El organismo mejora el uso de la insulina porque:  Aumenta la sensibilidad del organismo a la insulina.  Reduce las necesidades de insulina del organismo.  Disminuye el riesgo de enfermedad cardaca por la actividad fsica ya que  disminuye el colesterol y [employer] triglicridos.  Aumenta los niveles de colesterol bueno (como las lipoprotenas de alta densidad [HDL]) en el organismo.  Disminuye los niveles de glucosa en la [place]. SU PLAN DE ACTIVIDAD  Elija una actividad que disfrute y establezca objetivos realistas. Para ejercitarse sin riesgos, debe comenzar a Education administrator cualquier actividad fsica nueva lentamente y aumentar la intensidad del ejercicio de forma gradual con el tiempo. Su mdico o educador en diabetes podrn ayudarlo a crear un plan de actividades que lo beneficie. Las recomendaciones generales incluyen lo  siguiente:  Air cabin crew a los nios para que realicen al menos 60 minutos de actividad fsica Management consultant.  Estirarse y Education officer, environmental ejercicios de entrenamiento de la fuerza, como yoga o levantamiento de pesas, por lo menos 2 veces por semana.  Realizar en total por lo menos 150 minutos de ejercicios de intensidad moderada cada semana, como caminar a paso ligero o hacer gimnasia acutica.  Hacer ejercicio fsico por lo menos 3 das por semana y no dejar pasar ms de 2 das seguidos sin ejercitarse.  Evitar los perodos largos de inactividad (90 minutos o ms tiempo). Cuando deba pasar mucho tiempo sentado, haga pausas frecuentes para caminar o estirarse. RECOMENDACIONES PARA REALIZAR EJERCICIOS CUANDO SE TIENE DIABETES TIPO 1 O TIPO 2   Controle la glucosa en la sangre antes de comenzar. Si el nivel de glucosa en la sangre es de ms de 240 mg/dl, controle las cetonas en la [place]. No haga actividad fsica si hay cetonas.  Evite inyectarse insulina en las zonas del cuerpo que ejercitar. Por ejemplo, evite inyectarse insulina en:  Los brazos, si juega al tenis.  Las piernas, si corre.  Lleve un registro de:  Los alimentos que consume antes y despus de Tour manager.  Los momentos esperables de picos de accin de la insulina.  Los niveles de glucosa en la sangre antes y despus de hacer ejercicios.  El tipo y cantidad de [place] fsica que Biomedical engineer.  Revise los registros con su mdico. El mdico lo ayudar a Environmental education officer pautas para ajustar la cantidad de alimento y las cantidades de insulina antes y despus de Radio producer ejercicios.  Si toma insulina o agentes hipoglucemiantes por va oral, observe si hay signos y sntomas de hipoglucemia. Entre los que se incluyen:  Mareos.  Temblores.  Sudoracin.  Escalofros.  Confusin.  Beba gran cantidad de agua mientras hace ejercicios para evitar la deshidratacin o los golpes de Airline pilot. Durante la actividad fsica se pierde agua corporal que  se debe reponer.  Comente con su mdico antes de comenzar un programa de actividad fsica para verificar que sea seguro para usted. Recuerde, cualquier actividad es mejor que ninguna.   Esta informacin no tiene Theme park manager el consejo del mdico. Asegrese de hacerle al mdico cualquier  pregunta que tenga.   Document Released: 11/08/2007 Document Revised: 03/05/2015 Elsevier Interactive Patient Education [employer]

## 2016-06-03 NOTE — Progress Notes (Signed)
Carrie Buchanan, is a 40 y.o. female  NOB:[id number]  [id number]:[id number]  DOB - [birth year]  CC:  Chief Complaint  Patient presents with  . Establish Care       HPI: Carrie Buchanan is a 40 y.o. female here today to establish medical care, last seen in clinic 2/17, w/ no significant pmhx.  Pt p/w polyarticular body aches, including bilateral shoulder, bilat hips, and bilat knees.  Can happen at any time, c/o of  intermittent sharp shoulder pains  when she is trying to scratch her back. Denies am stiffness, swelling of joints. 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  She is married, healtlhy relationship, denies spousal abuse.  Per records, has had these pains in past, but pt states these aches have been increasing in last 3-4 wks.  She also c/o intermittent blurry vision (has not had her eyes checked recently), intermittent lip and upper extremity hands "tingling", which can also happen at any time, sometimes related to after she eats (does not recall any particular foods). No oral /throat swelling or pruritis, no f/c. No n/v/fevers/ha /photophobia/numbness.  Patient has No headache, No chest pain, No abdominal pain - No Nausea, No new weakness, No Cough - SOB. No urinary or stool incontinence.  Interpreter was used to communicate directly with patient for the entire encounter including providing detailed patient instructions.   Review of Systems: Per HPI, o/w all systems reviewed and negative.    No Known Allergies Past Medical History:  Diagnosis Date  . Appendicitis, acute, with peritonitis 02/20/2013  . Medical history non-contributory    Current Outpatient Prescriptions on File Prior to Visit  Medication Sig Dispense Refill  . Ascorbic Acid (VITAMIN C) 100 MG tablet Take 100 mg by mouth daily.    . Multiple Vitamins-Minerals (MULTIVITAMIN WITH MINERALS) tablet Take 1 tablet by mouth daily.    . ranitidine (ZANTAC) 150 MG tablet Take 1 tablet (150  mg total) by mouth 2 (two) times daily. 60 tablet 3   No current facility-administered medications on file prior to visit.    Family History  Problem Relation Age of Onset  . Asthma Mother    Social History   Social History  . Marital status: Married    Spouse name: N/A  . Number of children: N/A  . Years of education: N/A   Occupational History  . Not on file.   Social History Main Topics  . Smoking status: Never Smoker  . Smokeless tobacco: Never Used  . Alcohol use No  . Drug use: No  . Sexual activity: Not Currently    Birth control/ protection: IUD   Other Topics Concern  . Not on file   Social History Narrative  . No narrative on file    Objective:   Vitals:   06/03/16 0909  BP: 107/73  Pulse: 76  Resp: 16  Temp: 98.4 F (36.9 C)    Filed Weights   06/03/16 0909  Weight: 157 lb 12.8 oz (71.6 kg)    BP Readings from Last 3 Encounters:  06/03/16 107/73  01/02/16 107/74  12/12/15 110/72    Physical Exam: Constitutional: Patient appears well-developed and well-nourished. No distress. AAOx3, obese female, pleasant HENT: Normocephalic, atraumatic, External right and left ear normal. Oropharynx is clear and moist.   No cobblestoning.  bilat TMs clear. No facial/max sinus tenderness. Eyes: Conjunctivae and EOM are normal. PERRL, no scleral icterus. Neck: Normal ROM. Neck supple. No JVD.  CVS:  RRR, S1/S2 +, no murmurs, no gallops, no carotid bruit.  Pulmonary: Effort and breath sounds normal, no stridor, rhonchi, wheezes, rales.  Abdominal: Soft. BS +, obese, no tenderness, rebound or guarding.  Musculoskeletal: Normal range of motion. No edema and no tenderness.   No swollen knee joints appreicated, no tenderness to shoulders/knees found today on exam. Neg tripod signs, mild pain obtained to right lower back on right straight leg test, neg on left leg. LE: bilat/ no c/c/e, pulses 2+ bilateral. Lymphadenopathy: No lymphadenopathy noted,  cervical,. Neuro: Alert.  muscle tone coordination wnl. No cranial nerve deficit grossly. Skin: Skin is warm and dry. No rash noted. Not diaphoretic. No erythema. No pallor. Psychiatric: Normal mood and affect. Behavior, judgment, thought content normal.  Lab Results  Component Value Date   WBC 8.1 12/12/2015   HGB 12.6 12/12/2015   HCT 38.5 12/12/2015   MCV 93.0 12/12/2015   PLT 316 12/12/2015   Lab Results  Component Value Date   CREATININE 0.70 12/12/2015   BUN 13 12/12/2015   NA 137 12/12/2015   K 4.7 12/12/2015   CL 104 12/12/2015   CO2 26 12/12/2015    Lab Results  Component Value Date   HGBA1C 5.90 12/12/2015   Lipid Panel     Component Value Date/Time   CHOL 155 12/12/2015 1050   TRIG 135 12/12/2015 1050   HDL 34 (L) 12/12/2015 1050   CHOLHDL 4.6 12/12/2015 1050   VLDL 27 12/12/2015 1050   LDLCALC 94 12/12/2015 1050       Depression screen PHQ 2/9 06/03/2016 07/11/2014 01/29/2014  Decreased Interest 2 0 0  Down, Depressed, Hopeless 2 0 0  PHQ - 2 Score 4 0 0  Altered sleeping 3 - -  Tired, decreased energy 3 - -  Change in appetite 0 - -  Feeling bad or failure about yourself  0 - -  Trouble concentrating 3 - -  Moving slowly or fidgety/restless 0 - -  Suicidal thoughts 0 - -  PHQ-9 Score 13 - -    Assessment and plan:   1. Myalgia, polyarticular Labs 2015 neg ANA and RF, suspect possible somatization from possible depression (scores 13 on depression screen, denies si/hi), possible fibromyalgia as well - hx of prior visits for low back pain, and other pains in past per clinic records. - pt denies spousal abuse,  - Vitamin D, 25-hydroxy - chk tsh - trial amitriptyline 25 qhs  2. Tingling sensation, ?etiology unclear.  Lips and hands, no carotid bruits on exam. - food related? Recd food diary. - TSH - hx of borderline dm, ?neuropathy.  3. Blurry vision, bilateral - Ambulatory referral to Ophthalmology  4. Prediabetes Recd metfomin xr 500qd for  now.  5. Health maintenance Due for papsmear -recd f/u appt.   Return in about 4 weeks (around 07/01/2016) for pap smear/body aches.  The patient was given clear instructions to go to ER or return to medical center if symptoms don't improve, worsen or new problems develop. The patient verbalized understanding. The patient was told to call to get lab results if they haven't heard anything in the next week.    This note has been created with Education officer, environmental. Any transcriptional errors are unintentional.   Pete Glatter, MD, MBA/MHA [facility] [place], [place] [phone]   06/03/2016, 10:04 AM

## 2016-06-04 LAB — VITAMIN D 25 HYDROXY (VIT D DEFICIENCY, FRACTURES): VIT D 25 HYDROXY: 22 ng/mL — AB (ref 30–100)

## 2016-06-05 ENCOUNTER — Ambulatory Visit: Payer: Self-pay | Attending: Internal Medicine

## 2016-06-05 ENCOUNTER — Telehealth: Payer: Self-pay

## 2016-06-05 ENCOUNTER — Other Ambulatory Visit: Payer: Self-pay | Admitting: Internal Medicine

## 2016-06-05 MED ORDER — VITAMIN D (ERGOCALCIFEROL) 1.25 MG (50000 UNIT) PO CAPS: 50000.0000 [IU] | ORAL_CAPSULE | ORAL | 0 refills | Status: DC

## 2016-06-05 MED FILL — VIT D2 1.25 MG (50,000 UNIT: 1.25 MG | 84 days supply | Qty: 12 | Fill #0

## 2016-06-05 NOTE — Telephone Encounter (Signed)
[employer] [place] [phone] contacted pt go go over lab results pt is aware of results and doesn't have any questions.

## 2016-12-08 ENCOUNTER — Ambulatory Visit: Payer: Self-pay | Admitting: Internal Medicine

## 2016-12-08 ENCOUNTER — Ambulatory Visit: Payer: Self-pay | Attending: Internal Medicine | Admitting: Internal Medicine

## 2016-12-08 ENCOUNTER — Ambulatory Visit: Payer: Self-pay

## 2016-12-08 VITALS — BP 124/84 | HR 66 | Temp 97.9°F | Resp 16 | Wt 144.4 lb

## 2016-12-08 DIAGNOSIS — M25561 Pain in right knee: Secondary | ICD-10-CM | POA: Insufficient documentation

## 2016-12-08 DIAGNOSIS — M25562 Pain in left knee: Secondary | ICD-10-CM | POA: Insufficient documentation

## 2016-12-08 DIAGNOSIS — G5602 Carpal tunnel syndrome, left upper limb: Secondary | ICD-10-CM

## 2016-12-08 DIAGNOSIS — Z7984 Long term (current) use of oral hypoglycemic drugs: Secondary | ICD-10-CM | POA: Insufficient documentation

## 2016-12-08 DIAGNOSIS — R7303 Prediabetes: Secondary | ICD-10-CM | POA: Insufficient documentation

## 2016-12-08 DIAGNOSIS — E559 Vitamin D deficiency, unspecified: Secondary | ICD-10-CM | POA: Insufficient documentation

## 2016-12-08 DIAGNOSIS — M25511 Pain in right shoulder: Secondary | ICD-10-CM | POA: Insufficient documentation

## 2016-12-08 LAB — POCT GLYCOSYLATED HEMOGLOBIN (HGB A1C): Hemoglobin A1C: 5.8

## 2016-12-08 LAB — GLUCOSE, POCT (MANUAL RESULT ENTRY): POC GLUCOSE: 96 mg/dL (ref 70–99)

## 2016-12-08 MED ORDER — NAPROXEN 500 MG PO TABS: 500.0000 mg | ORAL_TABLET | Freq: Two times a day (BID) | ORAL | 0 refills | Status: DC

## 2016-12-08 MED ORDER — METFORMIN HCL ER 500 MG PO TB24: 500.0000 mg | ORAL_TABLET | Freq: Every day | ORAL | 3 refills | Status: DC

## 2016-12-08 MED ORDER — PREDNISONE 20 MG PO TABS: 60.0000 mg | ORAL_TABLET | Freq: Every day | ORAL | 0 refills | Status: DC

## 2016-12-08 MED FILL — ?PREDNISONE 20 MG TABLET: 20 | 10 days supply | Qty: 20 | Fill #0

## 2016-12-08 MED FILL — NAPROXEN 500 MG TABLET: 500 | 15 days supply | Qty: 30 | Fill #0

## 2016-12-08 MED FILL — METFORMIN HCL ER 500 MG TAB: 500 | 30 days supply | Qty: 30 | Fill #0

## 2016-12-08 NOTE — Patient Instructions (Addendum)
Vitamin D 5,000 IU daily recommended  - financial aid packet/  -    Tendinitis del manguito rotador (Rotator Cuff Tendinitis) La tendinitis del manguito rotador es la inflamacin de las bandas duras, de aspecto de cordn, que conectan el msculo a los huesos (tendones) en el manguito rotador. El manguito rotador es el conjunto de todos los msculos y tendones que conectan el brazo al hombro. El manguito rotador sostiene la cabeza del hueso del brazo (hmero) en el hueco (fosa) del omplato (escpula). CAUSAS Con frecuencia, la tendinitis del manguito rotador se origina con el uso excesivo de la articulacin involucrada. SIGNOS Y SNTOMAS  Dolor intenso en el hombro, que se extiende a la parte externa del brazo sobre el msculo del hombro.  Punto de sensibilidad en el rea lesionada.  El dolor aparece gradualmente y empeora al levantar el brazo hacia un lado (abduccin) o al llevarlo Norm[place] adentro (rotacin interna).  Puede producir un desgarro crnico: Cuando el tendn del manguito rotador se inflama, aparece el riesgo de no recibir Designer, fashion/clothing de Retail buyer y Conservator, museum/gallery la muerte de las fibras de los tendones. Esto aumenta el riesgo de que el tendn se deshilache o se desgarre por completo. DIAGNSTICO La tendinitis del manguito rotador se diagnostica despus de Automotive engineer historia clnica, un examen fsico y la revisin de los [place] de los estudios de diagnstico por imgenes. La historia clnica ayuda a determinar el tipo de lesin en el manguito rotador. El examen fsico incluir evaluar el hombro lesionado, palpar el rea y observarlo mientras hace ejercicios para determinar el rango de [place]. Generalmente, se realizan radiografas para descartar otras causas del dolor en el hombro, como fracturas. Suele realizarse una resonancia magntica cuando la lesin en el hombro es significativa. A veces, se realiza un estudio con contraste llamado artrograma por tomografa computarizada,  pero no es tan frecuente como la Health visitor. En algunas instituciones, tambin pueden usarse ecografas especiales para ayudar a Orthoptist. TRATAMIENTO Casos menos graves   Usar un cabestrillo para descansar el hombro durante un breve perodo. El uso prolongado del cabestrillo puede producir rigidez, debilidad y prdida del movimiento de la articulacin del hombro.  Es posible que le receten antinflamatorios, como ibuprofeno o naproxeno. Casos ms graves   Fisioterapia.  Inyecciones de corticoides en la articulacin del hombro.  Ciruga. INSTRUCCIONES PARA EL CUIDADO EN EL HOGAR  Use un cabestrillo o una frula hasta que mejore el dolor. El uso prolongado del cabestrillo puede producir rigidez, debilidad y prdida del movimiento de la articulacin del hombro.  Aplique hielo sobre la zona lesionada.  Ponga el hielo en una bolsa plstica.  Colquese una toalla entre la piel y la bolsa de hielo.  Deje el hielo durante 20 minutos, 2 a 3 veces por da.  Mientras el tendn le cause dolor, evite todo rango de movimiento que no sea moderado. Use el hombro y haga ejercicios solamente segn las indicaciones de su mdico. Suspenda los ejercicios o reduzca el rango de movimiento si el dolor o las molestias [place], a menos que el mdico le indique otra cosa.  Utilice los medicamentos de venta libre o recetados para Primary school teacher, el malestar o la fiebre, segn se lo indique el mdico.  Si le han inmovilizado el brazo (con un cabestrillo y tirantes), no los retire, excepto que su mdico se lo haya indicado o hasta que vea a su mdico para la visita de control. Si necesita quitarlos, mueva el brazo lo [employer]  posible.  Podr dormir sobre varias almohadas para disminuir la hinchazn y Chief Technology Officer. SOLICITE ATENCIN MDICA DE INMEDIATO SI:  El dolor en el hombro [place], o siente un nuevo dolor en el brazo, en la mano o en los dedos que no se alivia con  medicamentos.  Presenta sntomas nuevos o desconocidos, especialmente mayor adormecimiento en las manos o prdida de fuerza.  Empeoran los problemas que lo llevaron a la consulta con el mdico.  El brazo, la mano o los dedos estn adormecidos o siente hormigueos.  El brazo, la mano o los dedos estn hinchados, le duelen o se ven blancos o azules ASEGRESE DE QUE:  Comprende estas instrucciones.  Controlar su afeccin.  Recibir ayuda de inmediato si no mejora o si empeora. Esta informacin no tiene Theme park manager el consejo del mdico. Asegrese de hacerle al mdico cualquier pregunta que tenga. Document Released: 02/04/2009 Document Revised: 11/09/2014 Document Reviewed: 05/31/2013 Elsevier Interactive Patient Education  2017 Elsevier Inc.  -   Dolor de rodilla (Knee Pain) El dolor de rodilla es un sntoma muy comn y puede tener muchas causas. Suele desaparecer cuando se siguen las indicaciones del mdico en lo que respecta a Engineer, materials y las molestias en la casa. Sin embargo, puede Scientist, product/process development en una afeccin que requiere [place]. Algunas afecciones pueden incluir lo siguiente:  Artritis por uso y Chiropractor (artrosis).  Artritis por hinchazn e irritacin (artritis reumatoide o gota).  Un quiste o un crecimiento en la rodilla.  Una infeccin en la articulacin de la rodilla.  Una lesin que no se [place].  Dao, hinchazn o irritacin de los tejidos que sostienen la rodilla (distensin de ligamentos o tendinitis). Si el dolor de Paramedic, tal vez haya que realizar ms estudios para Scientist, forensic, los cuales pueden incluir radiografas u otros estudios de diagnstico por imgenes de la rodilla. Adems, es posible que haya que extraerle lquido de la rodilla. El tratamiento del dolor continuo de rodilla depende de la causa, pero puede incluir lo siguiente:  Medicamentos para Engineer, materials o reducir la  hinchazn.  Inyecciones de corticoides en la rodilla.  Fisioterapia.  Ciruga. INSTRUCCIONES PARA EL CUIDADO EN EL HOGAR  Tome los medicamentos solamente como se lo haya indicado el mdico.  Mantenga la rodilla en reposo y en alto (elevada) mientras est descansando.  No haga cosas que le causen dolor o que lo intensifiquen.  Evite las actividades o los ejercicios de alto impacto, como correr, Public relations account executive soga o hacer saltos de tijera.  Aplique hielo sobre la zona de la rodilla:  Ponga el hielo en una bolsa plstica.  Coloque una toalla entre la piel y la bolsa de hielo.  Coloque el hielo durante 20 minutos, 2 a 3 veces por da.  Pregntele al mdico si debe usar una Neurosurgeon.  Cuando duerma, pngase una almohada debajo de la rodilla.  Baje de peso si es necesario. [place] extra puede generar presin en la rodilla.  No consuma ningn producto que contenga tabaco, lo que incluye cigarrillos, tabaco de Theatre manager o Administrator, Civil Service. Si necesita ayuda para dejar de fumar, consulte al mdico. Fumar puede retrasar la curacin de cualquier problema que tenga en el hueso y Nurse, learning disability. SOLICITE ATENCIN MDICA SI:  El dolor de rodilla contina, [place].  Tiene fiebre junto con dolor de rodilla.  La rodilla se le tuerce o se le traba.  La rodilla est ms hinchada. SOLICITE ATENCIN MDICA DE [place]  SI:   La articulacin de la rodilla est caliente al tacto.  Tiene dolor en el pecho o dificultad para respirar. Esta informacin no tiene Theme park managercomo fin reemplazar el consejo del mdico. Asegrese de hacerle al mdico cualquier pregunta que tenga. Document Released: 04/06/2008 Document Revised: 11/09/2014 Elsevier Interactive Patient Education  2017 Arvin[employer]Elsevier Inc.  -  Sndrome del tnel carpiano (Carpal Tunnel Syndrome) El sndrome del tnel carpiano es una afeccin que causa dolor en la mano y en el brazo. El tnel carpiano es un espacio estrecho  ubicado en el lado palmar de la [place]. Los movimientos repetidos de la mueca o determinadas enfermedades pueden causar la hinchazn del tnel. Esta hinchazn puede comprimir el nervio principal de la mueca (nervio mediano). CUIDADOS EN EL HOGAR Si tiene una frula:   sela como se lo haya indicado el mdico. Qutesela solamente como se lo haya indicado el mdico.  Afloje la frula si los dedos:  Se le adormecen y siente hormigueos.  Se le ponen azulados y fros.  Mantenga la frula limpia y seca. Instrucciones generales   Baxter Internationalome los medicamentos de venta libre y los recetados solamente como se lo haya indicado el mdico.  Haga reposar la [place] de toda actividad que le cause dolor. Si es necesario, hable con su empleador United Stationerssobre los cambios que pueden hacerse en su lugar de [place] Dun[place]rabajo, por ejemplo, usar una almohadilla para apoyar la mueca mientras tipea.  Si se lo indican, aplique hielo sobre la zona dolorida:  Ponga el hielo en una bolsa plstica.  Coloque una toalla entre la piel y la bolsa de hielo.  Coloque el hielo durante 20minutos, 2 a 3veces por Futures traderda.  Concurra a todas las visitas de control como se lo haya indicado el mdico. Esto es importante.  Haga los ejercicios como se lo hayan indicado el mdico, el fisioterapeuta o el terapeuta ocupacional. SOLICITE AYUDA SI:  Aparecen nuevos sntomas.  Los medicamentos no Tourist information centre managerle alivian el dolor.  Los sntomas empeoran. Esta informacin no tiene Theme park managercomo fin reemplazar el consejo del mdico. Asegrese de hacerle al mdico cualquier pregunta que tenga. Document Released: 10/08/2011 Document Revised: 07/10/2015 Document Reviewed: 03/06/2015 Elsevier Interactive Patient Education  2017 ArvinMeritorElsevier Inc.

## 2016-12-08 NOTE — Progress Notes (Signed)
Carrie Buchanan, is a 41 y.o. female  ZOX:[id number]  [id number]  DOB - [birth year]  Chief Complaint  Patient presents with  . Headache  . Knee Pain        Subjective:   Carrie Buchanan is a 41 y.o. female here today for a follow up visit, last seen 8/17. Here to w/ co of right shoulder pain x 1 month, appears to be worsening.  + intermittent entire left hand tingling when she tries to use it a lot, resolves w/ rest.  Also c/o of worsening bilat knee pains, worse r >L, especially when she is bending down to pick things up.  Finished her vit d prx, but not taking otc vit d. Doing well w/ metformin daily.  Patient has No headache, No chest pain, No abdominal pain - No Nausea, No new weakness tingling or numbness, No Cough - SOB.  Interpreter was used to communicate directly with patient for the entire encounter including providing detailed patient instructions.   No problems updated.  ALLERGIES: No Known Allergies  PAST MEDICAL HISTORY: Past Medical History:  Diagnosis Date  . Appendicitis, acute, with peritonitis 02/20/2013  . Medical history non-contributory     MEDICATIONS AT HOME: Prior to Admission medications   Medication Sig Start Date End Date Taking? Authorizing Provider  Ascorbic Acid (VITAMIN C) 100 MG tablet Take 100 mg by mouth daily.    Historical Provider, MD  metFORMIN (GLUCOPHAGE XR) 500 MG 24 hr tablet Take 1 tablet (500 mg total) by mouth daily with breakfast. 12/08/16   Pete Glatter, MD  Multiple Vitamins-Minerals (MULTIVITAMIN WITH MINERALS) tablet Take 1 tablet by mouth daily.    Historical Provider, MD  naproxen (NAPROSYN) 500 MG tablet Take 1 tablet (500 mg total) by mouth 2 (two) times daily with a meal. 12/08/16   Pete Glatter, MD  predniSONE (DELTASONE) 20 MG tablet Take 3 tablets (60 mg total) by mouth daily with breakfast. Day 1-3, take 60mg  (=3 tabs) qam, day 4-7 take 40mg  qam (=2 tabs), day 8-10 take 1 tab daily, than stop 12/08/16    Pete Glatter, MD  ranitidine (ZANTAC) 150 MG tablet Take 1 tablet (150 mg total) by mouth 2 (two) times daily. Patient not taking: Reported on 12/08/2016 12/12/15   Ambrose Finland, NP     Objective:   Vitals:   12/08/16 1132  BP: 124/84  Pulse: 66  Resp: 16  Temp: 97.9 F (36.6 C)  TempSrc: Oral  SpO2: 100%  Weight: 144 lb 6.4 oz (65.5 kg)    Exam General appearance : Awake, alert, not in any distress. Speech Clear. Not toxic looking, pleasant HEENT: Atraumatic and Normocephalic, pupils equally reactive to light. Neck: supple, no JVD.  Chest:Good air entry bilaterally, no added sounds. Right shoulder; rom intact, ttp around subdeltoid muscle/bursa region on palpation.  Not warm to touch. CVS: S1 S2 regular, no murmurs/gallups or rubs. Abdomen: Bowel sounds active, Non tender and not distended with no gaurding, rigidity or rebound. bilat knees; reflexes 2+, no effusions/no joint warmth bilat, +crepitus bilat noted., rom intact. Extremities: B/L Lower Ext shows no edema, both legs are warm to touch, no reproducible numbness/tingling on left hand today, neg tinnel sign. Neurology: Awake alert, and oriented X 3, CN II-XII grossly intact, Non focal Skin:No Rash  Data Review Lab Results  Component Value Date   HGBA1C 58.8 12/08/2016   HGBA1C 5.90 12/12/2015    Depression screen [facility] 2/9 12/08/2016 06/03/2016 07/11/2014 01/29/2014  Decreased  Interest (No Data) 2 0 0  Down, Depressed, Hopeless 3 2 0 0  PHQ - 2 Score 3 4 0 0  Altered sleeping 0 3 - -  Tired, decreased energy 3 3 - -  Change in appetite 0 0 - -  Feeling bad or failure about yourself  0 0 - -  Trouble concentrating 2 3 - -  Moving slowly or fidgety/restless 2 0 - -  Suicidal thoughts 0 0 - -  PHQ-9 Score 10 13 - -      Assessment & Plan   1. Pain in both knees, unspecified chronicity Suspect overuse syndrome/mild arthritis, oa. Recd RICE, short course trial of steroids and naproxen, recd stretching  exercises/hamstring strengthening exercises prior to walking.  2. Right shoulder pain, unspecified chronicity Suspect either shoulder tendonitis vs biceps, around deltoid region - trial RICE, short course of steroids and naprozen bid  3. Vitamin D deficiency Recd otc vit d 5,000IU daiy  4. Prediabetes Doing well on metformin qd, continue - POCT glucose (manual entry) 96 - POCT glycosylated hemoglobin (Hb A1C) 5.8   5. Carpal tunnel syndrome on left vs overuse syndrome Trial hand splint, rx given, advised to wear 24hrs x 2 wks, than only while awake thereafter to see if helps.  6. Recd getting financial aid packet.     Patient have been counseled extensively about nutrition and exercise  Return in about 3 months (around 03/07/2017), or if symptoms worsen or fail to improve.  The patient was given clear instructions to go to ER or return to medical center if symptoms don't improve, worsen or new problems develop. The patient verbalized understanding. The patient was told to call to get lab results if they haven't heard anything in the next week.   This note has been created with Education officer, environmentalDragon speech recognition software and smart phrase technology. Any transcriptional errors are unintentional.   Pete Glatterawn T [place] Rios, MD, MBA/MHA [facility] [place], [name] [phone]   12/08/2016, 12:55 PM

## 2016-12-09 ENCOUNTER — Other Ambulatory Visit: Payer: Self-pay | Admitting: Internal Medicine

## 2016-12-29 ENCOUNTER — Ambulatory Visit (HOSPITAL_COMMUNITY)
Admission: EM | Admit: 2016-12-29 | Discharge: 2016-12-29 | Disposition: A | Discharge status: Other Acute Inpatient Hospital | Payer: Self-pay

## 2016-12-31 ENCOUNTER — Ambulatory Visit: Payer: Self-pay

## 2017-02-09 MED FILL — METFORMIN HCL ER 500 MG TAB: 500 | 30 days supply | Qty: 30 | Fill #1

## 2017-03-18 ENCOUNTER — Encounter: Payer: Self-pay | Admitting: Internal Medicine

## 2017-03-19 ENCOUNTER — Encounter: Payer: Self-pay | Admitting: Internal Medicine

## 2017-03-22 ENCOUNTER — Encounter: Payer: Self-pay | Admitting: Internal Medicine

## 2017-04-08 ENCOUNTER — Encounter: Payer: Self-pay | Admitting: Physician Assistant

## 2017-04-08 ENCOUNTER — Ambulatory Visit: Payer: Self-pay | Attending: Internal Medicine | Admitting: Physician Assistant

## 2017-04-08 VITALS — BP 97/63 | HR 70 | Temp 97.5°F | Resp 18 | Ht 64.0 in | Wt 146.0 lb

## 2017-04-08 DIAGNOSIS — N898 Other specified noninflammatory disorders of vagina: Secondary | ICD-10-CM | POA: Insufficient documentation

## 2017-04-08 DIAGNOSIS — Z7984 Long term (current) use of oral hypoglycemic drugs: Secondary | ICD-10-CM | POA: Insufficient documentation

## 2017-04-08 DIAGNOSIS — J029 Acute pharyngitis, unspecified: Secondary | ICD-10-CM | POA: Insufficient documentation

## 2017-04-08 DIAGNOSIS — K219 Gastro-esophageal reflux disease without esophagitis: Secondary | ICD-10-CM | POA: Insufficient documentation

## 2017-04-08 LAB — POCT URINALYSIS DIPSTICK
Bilirubin, UA: NEGATIVE
Glucose, UA: NEGATIVE
KETONES UA: NEGATIVE
LEUKOCYTES UA: NEGATIVE
Nitrite, UA: NEGATIVE
PH UA: 5 (ref 5.0–8.0)
PROTEIN UA: NEGATIVE
Spec Grav, UA: <=1.005 — AB (ref 1.010–1.025)
Urobilinogen, UA: 0.2 E.U./dL

## 2017-04-08 LAB — POCT RAPID STREP A (OFFICE): RAPID STREP A SCREEN: NEGATIVE

## 2017-04-08 MED ORDER — RANITIDINE HCL 150 MG PO TABS: 150.0000 mg | ORAL_TABLET | Freq: Two times a day (BID) | ORAL | 3 refills | Status: DC

## 2017-04-08 MED ORDER — FLUCONAZOLE 150 MG PO TABS: 150.0000 mg | ORAL_TABLET | Freq: Once | ORAL | 0 refills | Status: AC

## 2017-04-08 MED FILL — FLUCONAZOLE 150 MG TABLET: 150 | 1 days supply | Qty: 1 | Fill #0

## 2017-04-08 MED FILL — raNITIdine HCL 150 MG TABS: 150 | 30 days supply | Qty: 60 | Fill #0

## 2017-04-08 NOTE — Progress Notes (Signed)
Carrie Buchanan, is a 41 y.o. female  ZOX:[id number]  [id number]  DOB - 1976/10/20  Subjective:  Chief Complaint and HPI: Carrie Buchanan is a 41 y.o. female here today for vaginal odor and discharge for about 3 weeks.  She denies pelvic pain or fever.  No N/V/D.  No dysuria.  Married and monogamous.  She did take an antibiotic recently for ST. (a few doses of PCN)  She also c/o ST for about 3 days.  No f/c.  No URI s/sx.      ROS:   Constitutional:  No f/c, No night sweats, No unexplained weight loss. EENT:  No vision changes, No blurry vision, No hearing changes. No ear problems. Throat is sore. Respiratory: No cough, No SOB Cardiac: No CP, no palpitations GI:  No abd pain, No N/V/D. GU: No Urinary s/sx Musculoskeletal: No joint pain Neuro: No headache, no dizziness, no motor weakness.  Skin: No rash Endocrine:  No polydipsia. No polyuria.  Psych: Denies SI/HI  No problems updated.  ALLERGIES: No Known Allergies  PAST MEDICAL HISTORY: Past Medical History:  Diagnosis Date  . Appendicitis, acute, with peritonitis 02/20/2013  . Medical history non-contributory     MEDICATIONS AT HOME: Prior to Admission medications   Medication Sig Start Date End Date Taking? Authorizing Provider  Ascorbic Acid (VITAMIN C) 100 MG tablet Take 100 mg by mouth daily.   Yes [provider]  fluconazole (DIFLUCAN) 150 MG tablet Take 1 tablet (150 mg total) by mouth once. 04/08/17 04/08/17 Yes Sammye Staff, Marzella Schlein, PA-C  metFORMIN (GLUCOPHAGE XR) 500 MG 24 hr tablet Take 1 tablet (500 mg total) by mouth daily with breakfast. 12/08/16  Yes Langeland, Dawn T, MD  Multiple Vitamins-Minerals (MULTIVITAMIN WITH MINERALS) tablet Take 1 tablet by mouth daily.   Yes [provider]  naproxen (NAPROSYN) 500 MG tablet Take 1 tablet (500 mg total) by mouth 2 (two) times daily with a meal. 12/08/16  Yes Langeland, Dawn T, MD  ranitidine (ZANTAC) 150 MG tablet Take 1 tablet (150 mg  total) by mouth 2 (two) times daily. 04/08/17  Yes Anders Simmonds, PA-C     Objective:  EXAM:   Vitals:   04/08/17 1637  BP: 97/63  Pulse: 70  Resp: 18  Temp: 97.5 F (36.4 C)  TempSrc: Oral  SpO2: 98%  Weight: 146 lb (66.2 kg)  Height: 5\' 4"  (1.626 m)    General appearance : A&OX3. NAD. Non-toxic-appearing HEENT: Atraumatic and Normocephalic.  PERRLA. EOM intact.  TM clear B. Mouth-MMM, post pharynx WNL w/ minimal erythema, No PND. Neck: supple, no JVD. No cervical lymphadenopathy. No thyromegaly Chest/Lungs:  Breathing-non-labored, Good air entry bilaterally, breath sounds normal without rales, rhonchi, or wheezing  CVS: S1 S2 regular, no murmurs, gallops, rubs  Extremities: Bilateral Lower Ext shows no edema, both legs are warm to touch with = pulse throughout Neurology:  CN II-XII grossly intact, Non focal.   Psych:  TP linear. J/I WNL. Normal speech. Appropriate eye contact and affect.  Skin:  No Rash  Data Review Lab Results  Component Value Date   HGBA1C 5.8 12/08/2016   HGBA1C 5.90 12/12/2015     Assessment & Plan   1. Vaginal discharge - POCT urinalysis dipstick - Urine cytology ancillary only She did have antibiotic recently- fluconazole (DIFLUCAN) 150 MG tablet; Take 1 tablet (150 mg total) by mouth once.  Dispense: 1 tablet; Refill: 0  2. Gastroesophageal reflux disease, esophagitis presence not specified stable - ranitidine (ZANTAC)  150 MG tablet; Take 1 tablet (150 mg total) by mouth 2 (two) times daily.  Dispense: 60 tablet; Refill: 3  3. Pharyngitis, unspecified etiology Negative RS - POCT rapid strep A Fluids, rest, advil, tylenol  Patient have been counseled extensively about nutrition and exercise  Return in about 1 month (around 05/08/2017) for assign new PCP; f/up DM.  The patient was given clear instructions to go to ER or return to medical center if symptoms don't improve, worsen or new problems develop. The patient verbalized  understanding. The patient was told to call to get lab results if they haven't heard anything in the next week.    Georgian CoAngela Gelila Well, PA-C [facility] [place], [name] [phone]   04/08/2017, 4:48 PM

## 2017-04-08 NOTE — Progress Notes (Signed)
Patient is here for Vaginal concerns  Patient has not taken medication today. Patient has eaten today.

## 2017-04-12 LAB — URINE CYTOLOGY ANCILLARY ONLY
CANDIDA VAGINITIS: NEGATIVE
Chlamydia: NEGATIVE
NEISSERIA GONORRHEA: NEGATIVE
TRICH (WINDOWPATH): NEGATIVE

## 2017-04-13 ENCOUNTER — Other Ambulatory Visit: Payer: Self-pay | Admitting: Physician Assistant

## 2017-04-13 DIAGNOSIS — N760 Acute vaginitis: Principal | ICD-10-CM

## 2017-04-13 DIAGNOSIS — B9689 Other specified bacterial agents as the cause of diseases classified elsewhere: Secondary | ICD-10-CM

## 2017-04-13 MED ORDER — METRONIDAZOLE 500 MG PO TABS: 500.0000 mg | ORAL_TABLET | Freq: Two times a day (BID) | ORAL | 0 refills | Status: DC

## 2017-04-13 MED FILL — ?METRONIDAZOLE 500 MG TABLE: 500 | 7 days supply | Qty: 14 | Fill #0

## 2017-05-04 ENCOUNTER — Telehealth: Payer: Self-pay

## 2017-05-04 NOTE — Telephone Encounter (Signed)
Patient return CMA call   Patient Verify DOB   Patient was aware and understood  

## 2017-05-04 NOTE — Telephone Encounter (Signed)
CMA call regarding results  Patient di not answer but left a VM stating the reason of the call & to call me back

## 2017-05-04 NOTE — Telephone Encounter (Signed)
-----   Message from Margaretmary LombardNubia K Lisbon, [place] sent at 05/03/2017  4:50 PM EDT ----- Please inform patient of being positive for BV and medication being sent to the pharmacy for pickup ----- Message ----- From: Anders SimmondsMcClung, Angela M, PA-C Sent: 04/13/2017   9:00 AM To: Margaretmary LombardNubia K Lisbon, CMA  Please call patient and let her know that she tested Positive for bacterial vaginitis(not and STD).  I sent her a prescription to pick up and start at her earliest convenience.  Thanks, Georgian CoAngela McClung, PA-C

## 2017-12-17 ENCOUNTER — Other Ambulatory Visit: Payer: Self-pay | Admitting: Critical Care Medicine

## 2017-12-17 LAB — GLUCOSE, POCT (MANUAL RESULT ENTRY): POC Glucose: 150 mg/dl — AB (ref 70–99)

## 2017-12-17 MED ORDER — METFORMIN HCL ER 500 MG PO TB24: 500.0000 mg | ORAL_TABLET | Freq: Every day | ORAL | 3 refills | Status: DC

## 2017-12-17 MED FILL — METFORMIN HCL ER 500 MG TAB: 500 | 30 days supply | Qty: 30 | Fill #0

## 2017-12-17 NOTE — Progress Notes (Signed)
[facility]  Needs refill on Metformin

## 2017-12-20 ENCOUNTER — Telehealth: Payer: Self-pay | Admitting: Internal Medicine

## 2017-12-20 NOTE — Telephone Encounter (Signed)
Called pt. And LVM to return call and schedule appt. With new PCP. When pt. Calls back please schedule an appt.

## 2017-12-28 ENCOUNTER — Ambulatory Visit: Payer: Self-pay | Attending: Internal Medicine

## 2018-01-12 ENCOUNTER — Encounter: Payer: Self-pay | Admitting: Nurse Practitioner

## 2018-01-12 ENCOUNTER — Ambulatory Visit: Payer: Self-pay | Attending: Nurse Practitioner | Admitting: Nurse Practitioner

## 2018-01-12 VITALS — BP 115/79 | HR 73 | Temp 98.4°F | Ht 60.24 in | Wt 154.0 lb

## 2018-01-12 DIAGNOSIS — M62838 Other muscle spasm: Secondary | ICD-10-CM | POA: Insufficient documentation

## 2018-01-12 DIAGNOSIS — Z79899 Other long term (current) drug therapy: Secondary | ICD-10-CM | POA: Insufficient documentation

## 2018-01-12 DIAGNOSIS — Z9889 Other specified postprocedural states: Secondary | ICD-10-CM | POA: Insufficient documentation

## 2018-01-12 DIAGNOSIS — R7303 Prediabetes: Secondary | ICD-10-CM

## 2018-01-12 DIAGNOSIS — G5603 Carpal tunnel syndrome, bilateral upper limbs: Secondary | ICD-10-CM

## 2018-01-12 DIAGNOSIS — Z7984 Long term (current) use of oral hypoglycemic drugs: Secondary | ICD-10-CM | POA: Insufficient documentation

## 2018-01-12 LAB — GLUCOSE, POCT (MANUAL RESULT ENTRY): POC Glucose: 92 mg/dl (ref 70–99)

## 2018-01-12 LAB — POCT GLYCOSYLATED HEMOGLOBIN (HGB A1C): Hemoglobin A1C: 5.8

## 2018-01-12 MED ORDER — NAPROXEN 500 MG PO TABS: 500.0000 mg | ORAL_TABLET | Freq: Two times a day (BID) | ORAL | 1 refills | Status: DC

## 2018-01-12 MED ORDER — TIZANIDINE HCL 4 MG PO TABS: 4.0000 mg | ORAL_TABLET | Freq: Four times a day (QID) | ORAL | 1 refills | Status: DC | PRN

## 2018-01-12 MED FILL — ?TIZANIDINE HCL 4MG TABLETS: 4 | 7 days supply | Qty: 30 | Fill #0

## 2018-01-12 MED FILL — NAPROXEN 500 MG TABLET: 500 | 15 days supply | Qty: 30 | Fill #0

## 2018-01-12 NOTE — Patient Instructions (Addendum)
Prediabetes Prediabetes is the condition of having a blood sugar (blood glucose) level that is higher than it should be, but not high enough for you to be diagnosed with type 2 diabetes. Having prediabetes puts you at risk for developing type 2 diabetes (type 2 diabetes mellitus). Prediabetes may be called impaired glucose tolerance or impaired fasting glucose. Prediabetes usually does not cause symptoms. Your health care provider can diagnose this condition with blood tests. You may be tested for prediabetes if you are overweight and if you have at least one other risk factor for prediabetes. Risk factors for prediabetes include:  Having a family member with type 2 diabetes.  Being overweight or obese.  Being older than age 44.  Being of American-Indian, African-American, Hispanic/Latino, or Asian/Pacific Islander descent.  Having an inactive (sedentary) lifestyle.  Having a history of gestational diabetes or polycystic ovarian syndrome (PCOS).  Having low levels of good cholesterol (HDL-C) or high levels of blood fats (triglycerides).  Having high blood pressure.  What is blood glucose and how is blood glucose measured?  Blood glucose refers to the amount of glucose in your bloodstream. Glucose comes from eating foods that contain sugars and starches (carbohydrates) that the body breaks down into glucose. Your blood glucose level may be measured in mg/dL (milligrams per deciliter) or mmol/L (millimoles per liter).Your blood glucose may be checked with one or more of the following blood tests:  A fasting blood glucose (FBG) test. You will not be allowed to eat (you will fast) for at least 8 hours before a blood sample is taken. ? A normal range for FBG is 70-100 mg/dl (3.9-5.6 mmol/L).  An A1c (hemoglobin A1c) blood test. This test provides information about blood glucose control over the previous 2?68month.  An oral glucose tolerance test (OGTT). This test measures your blood  glucose twice: ? After fasting. This is your baseline level. ? Two hours after you drink a beverage that contains glucose.  You may be diagnosed with prediabetes:  If your FBG is 100?125 mg/dL (5.6-6.9 mmol/L).  If your A1c level is 5.7?6.4%.  If your OGGT result is 140?199 mg/dL (7.8-11 mmol/L).  These blood tests may be repeated to confirm your diagnosis. What happens if blood glucose is too high? The pancreas produces a hormone (insulin) that helps move glucose from the bloodstream into cells. When cells in the body do not respond properly to insulin that the body makes (insulin resistance), excess glucose builds up in the blood instead of going into cells. As a result, high blood glucose (hyperglycemia) can develop, which can cause many complications. This is a symptom of prediabetes. What can happen if blood glucose stays higher than normal for a long time? Having high blood glucose for a long time is dangerous. Too much glucose in your blood can damage your nerves and blood vessels. Long-term damage can lead to complications from diabetes, which may include:  Heart disease.  Stroke.  Blindness.  Kidney disease.  Depression.  Poor circulation in the feet and legs, which could lead to surgical removal (amputation) in severe cases.  How can prediabetes be prevented from turning into type 2 diabetes?  To help prevent type 2 diabetes, take the following actions:  Be physically active. ? Do moderate-intensity physical activity for at least 30 minutes on at least 5 days of the week, or as much as told by your health care provider. This could be brisk walking, biking, or water aerobics. ? Ask your health care provider what  activities are safe for you. A mix of physical activities may be best, such as walking, swimming, cycling, and strength training.  Lose weight as told by your health care provider. ? Losing 5-7% of your body weight can reverse insulin resistance. ? Your health  care provider can determine how much weight loss is best for you and can help you lose weight safely.  Follow a healthy meal plan. This includes eating lean proteins, complex carbohydrates, fresh fruits and vegetables, low-fat dairy products, and healthy fats. ? Follow instructions from your health care provider about eating or drinking restrictions. ? Make an appointment to see a diet and nutrition specialist (registered dietitian) to help you create a healthy eating plan that is right for you.  Do not smoke or use any tobacco products, such as cigarettes, chewing tobacco, and e-cigarettes. If you need help quitting, ask your health care provider.  Take over-the-counter and prescription medicines as told by your health care provider. You may be prescribed medicines that help lower the risk of type 2 diabetes.  This information is not intended to replace advice given to you by your health care provider. Make sure you discuss any questions you have with your health care provider. Document Released: 02/10/2016 Document Revised: 03/26/2016 Document Reviewed: 12/10/2015 Elsevier Interactive Patient Education  2018 [employer]  Prediabetes (Prediabetes) QU ES LA PREDIABETES? La prediabetes es la enfermedad que presenta un nivel de azcar en la sangre (glucemia) ms alto de lo normal, pero no lo suficientemente alto como para que le diagnostiquen diabetes tipo2. El hecho de ser prediabtico lo pone en riesgo de desarrollar diabetes tipo2 (diabetes mellitus tipo2). La prediabetes tambin se puede llamar intolerancia a la glucosa o glucosa alterada en ayunas. Generalmente, la prediabetes no causa sntomas. El mdico puede diagnosticar esta enfermedad por los anlisis de [place]. Los anlisis para Engineer, manufacturingdetectar la prediabetes se pueden realizar si usted tiene sobrepeso y si presenta al menos un factor de riesgo ms de prediabetes. Entre los factores de riesgo de prediabetes, se incluyen los  siguientes:  Warehouse managerTener un familiar con diabetes tipo2.  Sobrepeso u obesidad.  Tener ms de 42 aos.  Ser descendiente de indgenas norteamericanos, afroamericanos, hispanos o latinos, o asiticos o isleos del Pacfico.  Tener un estilo de vida inactivo (sedentario).  Tener antecedentes de diabetes gestacional o sndrome de ovario poliqustico (SOP).  Tener niveles bajos del colesterol bueno (HDL-C) o niveles altos de grasas en la sangre (triglicridos).  Tener hipertensin arterial. QU ES LA GLUCEMIA Y CMO SE MIDE? La glucemia hace referencia a la cantidad de glucosa que tiene en el torrente sanguneo. La glucosa proviene de los alimentos que contienen azcar y almidn (carbohidratos) que el organismo descompone para formar glucosa. El nivel de glucemia se puede medir en mg/dl (miligramos por decilitro) o mmol/l (milimoles por litro).La glucemia puede controlarse con uno o ms de los siguientes anlisis de sangre:  Medicin de la glucemia en [place]. No se le permitir comer (tendr que Devon Energyhacer ayuno) durante al menos 8horas antes de que se tome una [place]estra de [place]. ? Un rango normal de glucemia en ayunas es de 70 a 100mg /dl (de 3,9 a 1,6XWRU/E5,6mmol/l).  Un anlisis de sangre de A1c (hemoglobina A1c). Este anlisis proporciona informacin sobre el control de la glucemia durante los ltimos 2 o 3meses.  Prueba de tolerancia a la glucosa oral (PTGO). Esta prueba mide la glucemia dos veces: ? Despus del ayuno. Este es el valor inicial. ? Dos horas despus de ingerir Physiological scientistuna  bebida que contiene glucosa. Pueden diagnosticarle prediabetes en los siguientes casos:  Si la glucemia en ayunas es de 100 a 125mg /dl (de 5,6 a 8,1XBJY/N).  Si el nivel de A1c es del 5,7% al 6,4%.  Si el resultado de la PTGO es de 140 a 199mg /dl (de 7,8 a 82NFAO/Z). Estos anlisis de sangre se pueden repetir para Pharmacist, hospital diagnstico. QU SUCEDE SI LA GLUCEMIA ES DEMASIADO ALTA? El pncreas produce una  hormona (insulina) que ayuda a Merchant navy officer glucosa desde el torrente sanguneo hacia las clulas. Cuando las clulas no responden de forma [place] a la insulina que el organismo produce (resistencia a la insulina), el exceso de glucosa se acumula en la sangre en vez de dirigirse hacia las clulas. Como consecuencia, se puede desarrollar glucemia alta (hiperglucemia), que puede causar muchas complicaciones. Este es uno de los sntomas de la prediabetes. QU PUEDE SUCEDER SI LA GLUCEMIA PERMANECE MS ALTA DE LO NORMAL DURANTE MUCHO TIEMPO? Es peligroso Public librarian glucemia alta durante mucho tiempo. Demasiada glucosa en la sangre puede daar los nervios y los vasos sanguneos. El dao a largo plazo puede provocar complicaciones de la diabetes, por ejemplo:  Cardiopata.  Ictus.  Ceguera.  Enfermedad renal.  Depresin.  Mala circulacin en los pies y en las piernas, que podra llevar a la extraccin quirrgica (amputacin) en casos graves. CMO SE PUEDE EVITAR QUE LA PREDIABETES SE CONVIERTA EN DIABETES TIPO2? Para prevenir la diabetes tipo2, tome las siguientes medidas:  Haga actividad fsica. ? Haga actividad fsica de intensidad moderada durante al menos como mnimo 5das por [employer], o tanto como le haya indicado el mdico. Podra hacer caminatas dinmicas, ciclismo o [place]. ? Pregntele al mdico qu actividades son seguras para usted. Una combinacin de actividades puede ser la mejor opcin, por ejemplo, caminar, practicar natacin, andar en bicicleta y hacer entrenamiento de fuerza.  Baje de [employer] se lo haya indicado el mdico. ? Bajar entre el 5% y el 7% del peso corporal puede revertir la resistencia a la insulina. ? El mdico puede determinar cuntos kilos tiene que bajar y [place] a que adelgace de Wellsite geologist segura.  Siga un plan de alimentacin saludable. Este incluye consumir protenas magras, hidratos de carbono complejos, frutas y verduras frescas,  productos lcteos con bajo contenido de grasa y grasas saludables. ? Siga las indicaciones del mdico respecto de las restricciones para las comidas o las bebidas. ? Programe una cita con un especialista en alimentacin y nutricin (nutricionista certificado) para que lo ayude a Quarry manager plan de alimentacin saludable adecuado para usted.  No fume ni consuma ningn producto que contenga tabaco, lo que incluye cigarrillos, tabaco de Theatre manager y Administrator, Civil Service. Si necesita ayuda para dejar de fumar, consulte al [employer].  [employer] de venta libre y los recetados como se lo haya indicado el mdico. Es posible que le receten medicamentos que ayuden a disminuir el riesgo de tener diabetes tipo2. Esta informacin no tiene Theme park manager el consejo del mdico. Asegrese de hacerle al mdico cualquier pregunta que tenga. Document Released: 12/10/2015 Document Revised: 12/10/2015 Document Reviewed: 12/10/2015 Elsevier Interactive Patient Education  2018 Elsevier Inc.  Carpal Tunnel Syndrome Carpal tunnel syndrome is a condition that causes pain in your hand and arm. The carpal tunnel is a narrow area located on the palm side of your wrist. Repeated wrist motion or certain diseases may cause swelling within the tunnel. This swelling pinches the main nerve in the wrist (median nerve). What are the  causes? This condition may be caused by:  Repeated wrist motions.  Wrist injuries.  Arthritis.  A cyst or tumor in the carpal tunnel.  Fluid buildup during pregnancy.  Sometimes the cause of this condition is not known. What increases the risk? This condition is more likely to develop in:  People who have jobs that cause them to repeatedly move their wrists in the same motion, such as Health visitor.  Women.  People with certain conditions, such as: ? Diabetes. ? Obesity. ? An underactive thyroid (hypothyroidism). ? Kidney failure.  What are the signs or  symptoms? Symptoms of this condition include:  A tingling feeling in your fingers, especially in your thumb, index, and middle fingers.  Tingling or numbness in your hand.  An aching feeling in your entire arm, especially when your wrist and elbow are bent for long periods of time.  Wrist pain that goes up your arm to your shoulder.  Pain that goes down into your palm or fingers.  A weak feeling in your hands. You may have trouble grabbing and holding items.  Your symptoms may feel worse during the night. How is this diagnosed? This condition is diagnosed with a medical history and physical exam. You may also have tests, including:  An electromyogram (EMG). This test measures electrical signals sent by your nerves into the muscles.  X-rays.  How is this treated? Treatment for this condition includes:  Lifestyle changes. It is important to stop doing or modify the activity that caused your condition.  Physical or occupational therapy.  Medicines for pain and inflammation. This may include medicine that is injected into your wrist.  A wrist splint.  Surgery.  Follow these instructions at home: If you have a splint:  Wear it as told by your health care provider. Remove it only as told by your health care provider.  Loosen the splint if your fingers become numb and tingle, or if they turn cold and blue.  Keep the splint clean and dry. General instructions  Take over-the-counter and prescription medicines only as told by your health care provider.  Rest your wrist from any activity that may be causing your pain. If your condition is work related, talk to your employer about changes that can be made, such as getting a wrist pad to use while typing.  If directed, apply ice to the painful area: ? Put ice in a plastic bag. ? Place a towel between your skin and the bag. ? Leave the ice on for 20 minutes, 2-3 times per day.  Keep all follow-up visits as told by your health  care provider. This is important.  Do any exercises as told by your health care provider, physical therapist, or occupational therapist. Contact a health care provider if:  You have new symptoms.  Your pain is not controlled with medicines.  Your symptoms get worse. This information is not intended to replace advice given to you by your health care provider. Make sure you discuss any questions you have with your health care provider. Document Released: 10/16/2000 Document Revised: 02/27/2016 Document Reviewed: 03/06/2015 Elsevier Interactive Patient Education  2018 [employer].  Sndrome del tnel carpiano (Carpal Tunnel Syndrome) El sndrome del tnel carpiano es una afeccin que causa dolor en la mano y en el brazo. El tnel carpiano es un espacio estrecho ubicado en el lado palmar de la [place]. Los movimientos repetidos de la mueca o determinadas enfermedades pueden causar la hinchazn del tnel. Esta hinchazn puede comprimir  el nervio principal de la mueca (nervio [place]). CUIDADOS EN EL HOGAR Si tiene una frula:  sela como se lo haya indicado el mdico. Qutesela solamente como se lo haya indicado el mdico.  Afloje la frula si los dedos: ? Se le adormecen y siente hormigueos. ? Se le ponen azulados y fros.  Mantenga la frula limpia y seca. Instrucciones generales  [employer] de venta libre y los recetados solamente como se lo haya indicado el mdico.  Haga reposar la [place] de toda actividad que le cause dolor. Si es necesario, hable con su empleador [employer]ers cambios que pueden hacerse en su lugar de [place], por ejemplo, usar una almohadilla para apoyar la mueca mientras tipea.  Si se lo indican, aplique hielo sobre la zona dolorida: ? Nature conservation officer hielo en una bolsa plstica. ? Coloque una [employer] piel y la bolsa de hielo. ? Coloque el hielo durante , 2 a 3veces por da.  Concurra a todas las visitas de control como se lo haya  indicado el mdico. Esto es importante.  Haga los ejercicios como se lo hayan indicado el mdico, el fisioterapeuta o el terapeuta ocupacional. SOLICITE AYUDA SI:  Aparecen nuevos sntomas.  Los medicamentos no Tourist information centre manager.  Los sntomas empeoran. Esta informacin no tiene Theme park manager el consejo del mdico. Asegrese de hacerle al mdico cualquier pregunta que tenga. Document Released: 10/08/2011 Document Revised: 07/10/2015 Document Reviewed: 03/06/2015 Elsevier Interactive Patient Education  2018 [employer].

## 2018-01-12 NOTE — Progress Notes (Signed)
Assessment & Plan:  Carrie Buchanan was seen today for establish care and hand pain.  Diagnoses and all orders for this visit:  Prediabetes -     POCT A1C -     Glucose (CBG) -     CBC -     Basic metabolic panel -     Lipid panel -     Ambulatory referral to Ophthalmology  Continue medications. Keep blood sugar logs as instructed.   Bilateral carpal tunnel syndrome -     Ambulatory referral to Physical Therapy Continue to wear bilateral wrist splints when working -     naproxen (NAPROSYN) 500 MG tablet; Take 1 tablet (500 mg total) by mouth 2 (two) times daily with a meal.   Other orders -     tiZANidine (ZANAFLEX) 4 MG tablet; Take 1 tablet (4 mg total) by mouth every 6 (six) hours as needed for muscle spasms. To be used for intermittent posterior neck pain.     Patient has been counseled on age-appropriate routine health concerns for screening and prevention. These are reviewed and up-to-date. Referrals have been placed accordingly. Immunizations are up-to-date or declined.    Subjective:   Chief Complaint  Patient presents with  . Establish Care    Patient is here to establish care for prediabetes.  . Hand Pain    Pt's statd she injured her hand a year ago and it feels like it's swollen.    HPI Carrie Buchanan 42 y.o. female presents to office today to establish care. She has a history of prediabetes and is taking metformin 500mg  daily. VRI was used to communicate directly with patient for the entire encounter including providing detailed patient instructions.   Prediabetes Chronic. Stable. Taking metformin as prescribed. She denies any hyperglycemic symptoms. She does endorse moments of feeling "shaky". She is not sure if her symptoms are related to anxiety or hypoglycemia. She states states if she drinks a tea the symptoms seem to go away. She endorses blurry vision. She has not seen an eye doctor. She is overweight. BMI 29.84 Lab Results  Component Value Date   HGBA1C  5.8 01/12/2018    Hand Problem Endorses tingling and pain radiating from bilateral fingers to upper arms. She seems to think it could be related to a work related accident one year ago. She was carrying a vacuum cleaner and the cord got caught in the elevator and then wrapped around her hand causing the cord to pull her hand and also causing her to fall and land on her opposite hand. 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  She does wear bilateral arm splints when she is performing her cleaning duties. She works for 2 hours every night cleaning.  Current symptoms include tingling involving the tips of fingers and radiates upwards to upper arm aspect of the bilateral hand, of moderate, tolerable severity and pain involving the radial aspect of the bilateral hand, of moderate, tolerable severity. Pain 4/10. Inciting event/aggravating factors: repetitive activity: cleaning Patient's course of ZO:XWRUE worsening 5 months ago. Evaluation to date: none.  Treatment to date: Naproxen which some relief. Aggravating factors: rest; sleeping.   Review of Systems  Constitutional: Negative for fever, malaise/fatigue and weight loss.  HENT: Negative.  Negative for nosebleeds.   Eyes: Negative.  Negative for blurred vision, double vision and photophobia.  Respiratory: Negative.  Negative for cough and shortness of breath.   Cardiovascular: Negative.  Negative for chest pain, palpitations and leg swelling.  Gastrointestinal:  Negative.  Negative for heartburn, nausea and vomiting.  Musculoskeletal: Positive for joint pain. Negative for myalgias.       SEE HPI  Neurological: Positive for tingling and sensory change. Negative for dizziness, focal weakness, seizures and headaches.       SEE HPI  Psychiatric/Behavioral: Negative.  Negative for suicidal ideas.    Past Medical History:  Diagnosis Date  . Appendicitis, acute, with peritonitis 02/20/2013  . Medical history non-contributory   . Prediabetes      Past Surgical History:  Procedure Laterality Date  . APPENDECTOMY    . INTRAUTERINE DEVICE INSERTION  09/2012  . LAPAROSCOPIC APPENDECTOMY N/A 02/09/2013   Procedure: APPENDECTOMY LAPAROSCOPIC;  Surgeon: Emelia LoronMatthew Wakefield, MD;  [facility];  Service: General;  Laterality: N/A;    Family History  Problem Relation Age of Onset  . Asthma Mother     Social History Reviewed with no changes to be made today.   Outpatient Medications Prior to Visit  Medication Sig Dispense Refill  . Cholecalciferol (VITAMIN D3) 2000 units CHEW Chew by mouth.    . metFORMIN (GLUCOPHAGE XR) 500 MG 24 hr tablet Take 1 tablet (500 mg total) by mouth daily with breakfast. 90 tablet 3  . Multiple Vitamins-Minerals (MULTIVITAMIN WITH MINERALS) tablet Take 1 tablet by mouth daily.    . Ascorbic Acid (VITAMIN C) 100 MG tablet Take 100 mg by mouth daily.    . ranitidine (ZANTAC) 150 MG tablet Take 1 tablet (150 mg total) by mouth 2 (two) times daily. (Patient not taking: Reported on 01/12/2018) 60 tablet 3  . metroNIDAZOLE (FLAGYL) 500 MG tablet Take 1 tablet (500 mg total) by mouth 2 (two) times daily. 14 tablet 0  . naproxen (NAPROSYN) 500 MG tablet Take 1 tablet (500 mg total) by mouth 2 (two) times daily with a meal. (Patient not taking: Reported on 01/12/2018) 30 tablet 0   No facility-administered medications prior to visit.     No Known Allergies     Objective:    BP 115/79 (BP Location: Left Arm, Patient Position: Sitting, Cuff Size: Normal)   Pulse 73   Temp 98.4 F (36.9 C) (Oral)   Ht 5' 0.24" (1.53 m)   Wt 154 lb (69.9 kg)   SpO2 100%   BMI 29.84 kg/m  Wt Readings from Last 3 Encounters:  01/12/18 154 lb (69.9 kg)  04/08/17 146 lb (66.2 kg)  12/08/16 144 lb 6.4 oz (65.5 kg)    Physical Exam  Constitutional: She is oriented to person, place, and time. She appears well-developed and well-nourished. She is cooperative.  HENT:  Head: Normocephalic and atraumatic.  Eyes: EOM are  normal.  Neck: Normal range of motion.  Cardiovascular: Normal rate, regular rhythm, normal heart sounds and intact distal pulses. Exam reveals no gallop and no friction rub.  No murmur heard. Pulmonary/Chest: Effort normal and breath sounds normal. No tachypnea. No respiratory distress. She has no decreased breath sounds. She has no wheezes. She has no rhonchi. She has no rales. She exhibits no tenderness.  Abdominal: Soft. Bowel sounds are normal.  Musculoskeletal: Normal range of motion. She exhibits no edema.  Neurological: She is alert and oriented to person, place, and time. Coordination normal.  Phalen test positive  Skin: Skin is warm and dry.  Psychiatric: She has a normal mood and affect. Her behavior is normal. Judgment and thought content normal.  Nursing note and vitals reviewed.     Patient has been counseled extensively about nutrition and  exercise as well as the importance of adherence with medications and regular follow-up. The patient was given clear instructions to go to ER or return to medical center if symptoms don't improve, worsen or new problems develop. The patient verbalized understanding.   Follow-up: Return in about 6 months (around 07/15/2018) for prediabetes.   Claiborne Rigg, FNP-BC [facility] [place], [place] [phone]   01/12/2018, 1:00 PM

## 2018-01-13 LAB — LIPID PANEL
CHOL/HDL RATIO: 3.7 ratio (ref 0.0–4.4)
Cholesterol, Total: 175 mg/dL (ref 100–199)
HDL: 47 mg/dL (ref >39)
LDL CALC: 105 mg/dL — AB (ref 0–99)
Triglycerides: 116 mg/dL (ref 0–149)
VLDL Cholesterol Cal: 23 mg/dL (ref 5–40)

## 2018-01-13 LAB — CBC
HEMATOCRIT: 38.7 % (ref 34.0–46.6)
HEMOGLOBIN: 13 g/dL (ref 11.1–15.9)
MCH: 31 pg (ref 26.6–33.0)
MCHC: 33.6 g/dL (ref 31.5–35.7)
MCV: 92 fL (ref 79–97)
Platelets: 294 10*3/uL (ref 150–379)
RBC: 4.19 x10E6/uL (ref 3.77–5.28)
RDW: 14.2 % (ref 12.3–15.4)
WBC: 7.8 10*3/uL (ref 3.4–10.8)

## 2018-01-13 LAB — BASIC METABOLIC PANEL
BUN / CREAT RATIO: 26 — AB (ref 9–23)
BUN: 19 mg/dL (ref 6–24)
CO2: 24 mmol/L (ref 20–29)
CREATININE: 0.73 mg/dL (ref 0.57–1.00)
Calcium: 9 mg/dL (ref 8.7–10.2)
Chloride: 104 mmol/L (ref 96–106)
GFR, EST AFRICAN AMERICAN: 117 mL/min/{1.73_m2} (ref >59)
GFR, EST NON AFRICAN AMERICAN: 101 mL/min/{1.73_m2} (ref >59)
Glucose: 93 mg/dL (ref 65–99)
Potassium: 4.4 mmol/L (ref 3.5–5.2)
SODIUM: 142 mmol/L (ref 134–144)

## 2018-01-20 ENCOUNTER — Telehealth: Payer: Self-pay

## 2018-01-20 NOTE — Telephone Encounter (Signed)
-----   Message from Claiborne RiggZelda W Fleming, NP sent at 01/16/2018  8:43 PM EDT ----- Labs are essentially normal. Make sure you are drinking at least 48 oz of water per day. Work on eating a low fat, heart healthy diet and participate in regular aerobic exercise program to control as well. Exercise at least 150 minutes per week. Your LDL cholesterol is slightly elevated.  No fried foods. No junk foods, sodas, sugary drinks, unhealthy snacking, or smoking.

## 2018-01-20 NOTE — Telephone Encounter (Signed)
CMA spoke to patient to inform lab results and PCP advising.  Patient understood. Verified DOB.   CitigroupSpanish Interpreter Antonio [phone] assist with the call.

## 2018-03-21 MED FILL — METFORMIN HCL ER 500 MG TAB: 500 | 30 days supply | Qty: 30 | Fill #1

## 2018-04-04 ENCOUNTER — Encounter (HOSPITAL_COMMUNITY): Payer: Self-pay

## 2018-04-04 ENCOUNTER — Emergency Department (HOSPITAL_COMMUNITY)
Admission: EM | Admit: 2018-04-04 | Discharge: 2018-04-05 | Disposition: A | Discharge status: Home / Self Care | Payer: Self-pay | Attending: Emergency Medicine | Admitting: Emergency Medicine

## 2018-04-04 DIAGNOSIS — R42 Dizziness and giddiness: Secondary | ICD-10-CM | POA: Insufficient documentation

## 2018-04-04 DIAGNOSIS — Z79899 Other long term (current) drug therapy: Secondary | ICD-10-CM | POA: Insufficient documentation

## 2018-04-04 LAB — URINALYSIS, ROUTINE W REFLEX MICROSCOPIC
Bilirubin Urine: NEGATIVE
GLUCOSE, UA: NEGATIVE mg/dL
Ketones, ur: NEGATIVE mg/dL
Nitrite: NEGATIVE
Protein, ur: NEGATIVE mg/dL
SPECIFIC GRAVITY, URINE: 1.024 (ref 1.005–1.030)
pH: 5 (ref 5.0–8.0)

## 2018-04-04 LAB — I-STAT BETA HCG BLOOD, ED (MC, WL, AP ONLY): I-stat hCG, quantitative: <5 m[IU]/mL (ref <5)

## 2018-04-04 LAB — BASIC METABOLIC PANEL
Anion gap: 5 (ref 5–15)
BUN: 23 mg/dL — ABNORMAL HIGH (ref 6–20)
CALCIUM: 8.6 mg/dL — AB (ref 8.9–10.3)
CO2: 25 mmol/L (ref 22–32)
CREATININE: 0.81 mg/dL (ref 0.44–1.00)
Chloride: 109 mmol/L (ref 101–111)
GFR calc non Af Amer: >60 mL/min (ref >60)
Glucose, Bld: 138 mg/dL — ABNORMAL HIGH (ref 65–99)
Potassium: 4.3 mmol/L (ref 3.5–5.1)
Sodium: 139 mmol/L (ref 135–145)

## 2018-04-04 LAB — CBC
HCT: 36.8 % (ref 36.0–46.0)
Hemoglobin: 12.2 g/dL (ref 12.0–15.0)
MCH: 31 pg (ref 26.0–34.0)
MCHC: 33.2 g/dL (ref 30.0–36.0)
MCV: 93.6 fL (ref 78.0–100.0)
PLATELETS: 235 10*3/uL (ref 150–400)
RBC: 3.93 MIL/uL (ref 3.87–5.11)
RDW: 13.1 % (ref 11.5–15.5)
WBC: 14.2 10*3/uL — ABNORMAL HIGH (ref 4.0–10.5)

## 2018-04-04 MED ORDER — ONDANSETRON 4 MG PO TBDP: 4.0000 mg | ORAL_TABLET | Freq: Three times a day (TID) | ORAL | 0 refills | Status: DC | PRN

## 2018-04-04 MED ORDER — MECLIZINE HCL 25 MG PO TABS
25.0000 mg | ORAL_TABLET | Freq: Once | ORAL | Status: AC
  Administered 2018-04-05: 25 mg via ORAL
  Filled 2018-04-04: qty 1

## 2018-04-04 MED ORDER — ONDANSETRON 4 MG PO TBDP
4.0000 mg | ORAL_TABLET | Freq: Once | ORAL | Status: AC
Start: 2018-04-04 — End: 2018-04-05
  Administered 2018-04-05: 4 mg via ORAL
  Filled 2018-04-04: qty 1

## 2018-04-04 MED ORDER — MECLIZINE HCL 25 MG PO TABS: 25.0000 mg | ORAL_TABLET | Freq: Three times a day (TID) | ORAL | 0 refills | Status: DC | PRN

## 2018-04-04 NOTE — ED Provider Notes (Signed)
[facility] Provider Note   CSN: [id number] Arrival date & time: 04/04/18  2132     History   Chief Complaint Chief Complaint  Patient presents with  . Dizziness    HPI Carrie Buchanan is a 42 y.o. female.  HPI  This is a 42 year old female who presents with dizziness and vomiting.  Patient reports onset of dizziness which she describes as "feeling dizzy and like I might fall."  Dizziness was worse with position changes and leaning forward.  She also reports it was worse with head turning.  She denies headache, abdominal pain.  She did have one episode of nonbilious, nonbloody emesis.  She reports good oral intake and good fluid intake over the last several days.  She is never had an instance like this before.  She denies any neurologic deficits including weakness, numbness, tingling, speech difficulty.  She currently states she feels much better.  She denies any chest pain, shortness of breath, abdominal pain, urinary symptoms.  She denies any recent illnesses or medication changes.  History was taken with Spanish interpreter.  Past Medical History:  Diagnosis Date  . Appendicitis, acute, with peritonitis 02/20/2013  . Medical history non-contributory   . Prediabetes     Patient Active Problem List   Diagnosis Date Noted  . IFG (impaired fasting glucose) 07/11/2014  . High triglycerides 07/11/2014  . Appendicitis, acute, with peritonitis 02/20/2013  . Twin pregnancy, delivered vaginally, current hospitalization 04/21/2012  . Multiparity 02/18/2012  . Short cervix, antepartum, twins 01/14/2012  . Twin pregnancy, antepartum 01/01/2012  . AMA (advanced maternal age) multigravida 35+ 01/01/2012  . Late prenatal care 01/01/2012  . High-risk pregnancy supervision 01/01/2012    Past Surgical History:  Procedure Laterality Date  . APPENDECTOMY    . INTRAUTERINE DEVICE INSERTION  09/2012  . LAPAROSCOPIC APPENDECTOMY N/A 02/09/2013   Procedure: APPENDECTOMY LAPAROSCOPIC;  Surgeon: Emelia LoronMatthew Wakefield, MD;  Location: [facility] OR;  Service: General;  Laterality: N/A;     OB History    Gravida  7   Para  6   Term  6   Preterm      AB  1   Living  7     SAB  1   TAB      Ectopic      Multiple  1   Live Births  6            Home Medications    Prior to Admission medications   Medication Sig Start Date End Date Taking? Authorizing Provider  Ascorbic Acid (VITAMIN C) 100 MG tablet Take 100 mg by mouth daily.    [provider]  Cholecalciferol (VITAMIN D3) 2000 units CHEW Chew by mouth.    [provider]  meclizine (ANTIVERT) 25 MG tablet Take 1 tablet (25 mg total) by mouth 3 (three) times daily as needed for dizziness. 04/04/18   Shon BatonHorton, Mandeep Ferch F, MD  metFORMIN (GLUCOPHAGE XR) 500 MG 24 hr tablet Take 1 tablet (500 mg total) by mouth daily with breakfast. 12/17/17   Storm FriskWright, Patrick E, MD  Multiple Vitamins-Minerals (MULTIVITAMIN WITH MINERALS) tablet Take 1 tablet by mouth daily.    [provider]  naproxen (NAPROSYN) 500 MG tablet Take 1 tablet (500 mg total) by mouth 2 (two) times daily with a meal. 01/12/18   Claiborne RiggFleming, [name], NP  ondansetron (ZOFRAN ODT) 4 MG disintegrating tablet Take 1 tablet (4 mg total) by mouth every 8 (eight) hours as needed for  nausea or vomiting. 04/04/18   Vishnu Moeller, Mayer Masker, MD  ranitidine (ZANTAC) 150 MG tablet Take 1 tablet (150 mg total) by mouth 2 (two) times daily. Patient not taking: Reported on 01/12/2018 04/08/17   Anders Simmonds, PA-C  tiZANidine (ZANAFLEX) 4 MG tablet Take 1 tablet (4 mg total) by mouth every 6 (six) hours as needed for muscle spasms. 01/12/18   Claiborne Rigg, NP    Family History Family History  Problem Relation Age of Onset  . Asthma Mother     Social History Social History   Tobacco Use  . Smoking status: Never Smoker  . Smokeless tobacco: Never Used  Substance Use Topics  . Alcohol use: No  . Drug use: No      Allergies   Patient has no known allergies.   Review of Systems Review of Systems  Constitutional: Negative for fever.  Respiratory: Negative for shortness of breath.   Cardiovascular: Negative for chest pain.  Gastrointestinal: Positive for nausea and vomiting. Negative for abdominal pain and diarrhea.  Genitourinary: Negative for dysuria.  Neurological: Positive for dizziness and light-headedness. Negative for speech difficulty and weakness.  All other systems reviewed and are negative.    Physical Exam Updated Vital Signs BP 123/84 (BP Location: Right Arm)   Pulse 63   Temp 98 F (36.7 C) (Oral)   Resp 16   Ht 5\' 2"  (1.575 m)   Wt 68 kg (150 lb)   SpO2 100%   BMI 27.44 kg/m   Physical Exam  Constitutional: She is oriented to person, place, and time. She appears well-developed and well-nourished. No distress.  HENT:  Head: Normocephalic and atraumatic.  Mucous membranes moist  Eyes: Pupils are equal, round, and reactive to light.  Extraocular movements intact, no nystagmus noted  Neck: Neck supple.  Cardiovascular: Normal rate, regular rhythm and normal heart sounds.  Pulmonary/Chest: Effort normal and breath sounds normal. No respiratory distress. She has no wheezes.  Abdominal: Soft. Bowel sounds are normal. There is no tenderness.  Musculoskeletal: She exhibits no edema.  Neurological: She is alert and oriented to person, place, and time.  Cranial nerves II through XII intact, 5 out of 5 strength in all 4 extremities, no dysmetria to finger-nose-finger  Skin: Skin is warm and dry.  Psychiatric: She has a normal mood and affect.  Nursing note and vitals reviewed.    ED Treatments / Results  Labs (all labs ordered are listed, but only abnormal results are displayed) Labs Reviewed  BASIC METABOLIC PANEL - Abnormal; Notable for the following components:      Result Value   Glucose, Bld 138 (*)    BUN 23 (*)    Calcium 8.6 (*)    All other components  within normal limits  CBC - Abnormal; Notable for the following components:   WBC 14.2 (*)    All other components within normal limits  URINALYSIS, ROUTINE W REFLEX MICROSCOPIC - Abnormal; Notable for the following components:   Hgb urine dipstick LARGE (*)    Leukocytes, UA SMALL (*)    Bacteria, UA RARE (*)    All other components within normal limits  I-STAT BETA HCG BLOOD, ED ([facility], [facility], [facility] ONLY)    EKG EKG Interpretation  Date/Time:  Monday April 04 2018 22:02:36 EDT Ventricular Rate:  64 PR Interval:  158 QRS Duration: 86 QT Interval:  410 QTC Calculation: 422 R Axis:   74 Text Interpretation:  Normal sinus rhythm Normal ECG Confirmed by Ja Pistole,  Toni Amend ([id number]) on 04/04/2018 11:00:50 PM   Radiology No results found.  Procedures Procedures (including critical care time)  Medications Ordered in ED Medications  meclizine (ANTIVERT) tablet 25 mg (has no administration in time range)  ondansetron (ZOFRAN-ODT) disintegrating tablet 4 mg (has no administration in time range)     Initial Impression / Assessment and Plan / ED Course  I have reviewed the triage vital signs and the nursing notes.  Pertinent labs & imaging results that were available during my care of the patient were reviewed by me and considered in my medical decision making (see chart for details).     Patient presents with an episode of dizziness and one episode of vomiting.  She is overall nontoxic-appearing and her symptoms have resolved spontaneously.  Vital signs are normal.  She has no neurologic deficit or evidence of ataxia or cerebellar dysfunction on exam.  She appears well-hydrated.  Lab work-up is unremarkable.  EKG shows no evidence of acute arrhythmia.  Patient ambulated without difficulty.  Orthostatics are negative.  Given the self-limited nature as well as dizziness associated with vomiting, suspect possible vertigo.  Patient given meclizine and Zofran.  Will discharge home with  meclizine.  After history, exam, and medical workup I feel the patient has been appropriately medically screened and is safe for discharge home. Pertinent diagnoses were discussed with the patient. Patient was given return precautions.   Final Clinical Impressions(s) / ED Diagnoses   Final diagnoses:  Dizziness    ED Discharge Orders        Ordered    meclizine (ANTIVERT) 25 MG tablet  3 times daily PRN     04/04/18 2343    ondansetron (ZOFRAN ODT) 4 MG disintegrating tablet  Every 8 hours PRN     04/04/18 2343       Shon Baton, MD 04/04/18 2356

## 2018-04-04 NOTE — Discharge Instructions (Addendum)
You were seen today for dizziness.  Your work-up is reassuring.  Your dizziness may be related to vertigo.  Take meclizine as prescribed if you develop dizziness.  Make sure to stay hydrated.

## 2018-04-04 NOTE — ED Triage Notes (Signed)
Pt is spanish speaking only. Pt states that this afternoon she began to feel dizzy, like she was going to fall, vomited x 1 and felt "shaky"

## 2018-04-05 MED FILL — ONDANSETRON ODT 4 MG TABLET: 4 | 6 days supply | Qty: 20 | Fill #0

## 2018-04-05 MED FILL — MECLIZINE 25 MG TABLET: 25 | 10 days supply | Qty: 30 | Fill #0

## 2018-04-05 NOTE — ED Notes (Signed)
Pt ambulated in hallway unassisted and with steady gait

## 2018-04-13 ENCOUNTER — Ambulatory Visit: Payer: Self-pay | Attending: Nurse Practitioner | Admitting: Nurse Practitioner

## 2018-04-13 ENCOUNTER — Encounter: Payer: Self-pay | Admitting: Nurse Practitioner

## 2018-04-13 VITALS — BP 116/81 | HR 65 | Temp 98.6°F | Ht 60.29 in | Wt 151.6 lb

## 2018-04-13 DIAGNOSIS — Z7984 Long term (current) use of oral hypoglycemic drugs: Secondary | ICD-10-CM | POA: Insufficient documentation

## 2018-04-13 DIAGNOSIS — Z79899 Other long term (current) drug therapy: Secondary | ICD-10-CM | POA: Insufficient documentation

## 2018-04-13 DIAGNOSIS — R42 Dizziness and giddiness: Secondary | ICD-10-CM | POA: Insufficient documentation

## 2018-04-13 DIAGNOSIS — R7303 Prediabetes: Secondary | ICD-10-CM | POA: Insufficient documentation

## 2018-04-13 LAB — GLUCOSE, POCT (MANUAL RESULT ENTRY): POC Glucose: 90 mg/dL (ref 70–99)

## 2018-04-13 MED ORDER — FLUTICASONE PROPIONATE 50 MCG/ACT NA SUSP
2.0000 | Freq: Every day | NASAL | 6 refills | Status: DC
Start: 2018-04-13 — End: 2019-04-21

## 2018-04-13 MED FILL — FLUTICASONE PROP 50 MCG SPR: 50 | 30 days supply | Qty: 16 | Fill #0

## 2018-04-13 NOTE — Patient Instructions (Signed)
Mareos (Dizziness) Los mareos son un problema muy frecuente. Causan sensacin de inestabilidad o de desvanecimiento. Puede sentir que se va a desmayar. Un mareo puede provocarle una lesin si se tropieza o se cae. Cualquier persona puede marearse, pero los mareos son ms frecuentes en los adultos mayores. Esta afeccin puede tener muchas causas, por ejemplo:  Medicamentos.  Deshidratacin.  Enfermedad. CUIDADOS EN EL HOGAR Estas indicaciones pueden ayudarlo con el trastorno: Comida y bebida  Beba suficiente lquido para mantener el pis (orina) claro o de color amarillo plido. Esto evita la deshidratacin. Trate de beber ms lquidos transparentes, como agua.  No beba alcohol.  Limite la cantidad de cafena que bebe o come si el mdico se lo indic.  Limite la cantidad de sal que bebe o come si el mdico se lo indic. Actividad  Evite los movimientos rpidos. ? Cuando se levante de una silla, sujtese hasta sentirse bien. ? Por la maana, sintese primero a un lado de la cama. Cuando se sienta bien, pngase lentamente de pie mientras se sostiene de algo, hasta que sepa que ha logrado el equilibrio.  Mueva las piernas con frecuencia si debe estar de pie en un lugar durante mucho tiempo. Mientras est de pie, contraiga y relaje los msculos de las piernas.  No conduzca vehculos ni utilice maquinarias pesadas si se siente mareado.  Evite agacharse si se siente mareado. En su casa, coloque los objetos de modo que le resulte fcil alcanzarlos sin agacharse. Estilo de vida  No consuma ningn producto que contenga tabaco, lo que incluye cigarrillos, tabaco de mascar o cigarrillos electrnicos. Si necesita ayuda para dejar de fumar, consulte al mdico.  Trate de reducir el nivel de estrs practicando actividades como el yoga o la meditacin. Hable con el mdico si necesita ayuda. Instrucciones generales  Controle sus mareos para ver si hay cambios.  Tome los medicamentos solamente  como se lo haya indicado el mdico. Hable con el mdico si cree que algn medicamento que est tomando es la causa de sus mareos.  Infrmele a un amigo o a un familiar si se siente mareado. Pdale a esta persona que llame al mdico si observa cambios en su comportamiento.  Concurra a todas las visitas de control como se lo haya indicado el mdico. Esto es importante. SOLICITE AYUDA SI:  Los mareos persisten.  Los mareos o la sensacin de desvanecimiento empeoran.  Siente malestar estomacal (nuseas).  Tiene problemas para escuchar.  Aparecen nuevos sntomas.  Cuando est de pie se siente inestable o que la habitacin da vueltas.  SOLICITE AYUDA DE INMEDIATO SI:  Vomita o tiene diarrea y no puede comer ni beber nada.  Tiene dificultad para lo siguiente: ? Hablar. ? Caminar. ? Tragar. ? Usar los brazos, las manos o las piernas.  Siente una debilidad generalizada.  No piensa con claridad o tiene dificultades para armar oraciones. Es posible que un amigo o un familiar adviertan que esto ocurre.  Tiene los siguientes sntomas: ? Dolor en el pecho. ? Dolor en el vientre (abdomen). ? Falta de aire. ? Sudoracin.  Cambios en la visin.  Hemorragias.  Dolores de cabeza.  Dolor o rigidez en el cuello.  Fiebre.  Esta informacin no tiene como fin reemplazar el consejo del mdico. Asegrese de hacerle al mdico cualquier pregunta que tenga. Document Released: 10/08/2011 Document Revised: 03/05/2015 Document Reviewed: 10/15/2014 Elsevier Interactive Patient Education  2017 [employer]  

## 2018-04-13 NOTE — Progress Notes (Signed)
Assessment & Plan:  Carrie Buchanan was seen today for hospitalization follow-up.  Diagnoses and all orders for this visit:  Dizziness -     fluticasone (FLONASE) 50 MCG/ACT nasal spray; Place 2 sprays into both nostrils daily. Continue meclizine and zofran as prescribed.  Follow up in 3 weeks. If no improvement; will need to see ENT   Prediabetes -     Glucose (CBG)    Patient has been counseled on age-appropriate routine health concerns for screening and prevention. These are reviewed and up-to-date. Referrals have been placed accordingly. Immunizations are up-to-date or declined.    Subjective:   Chief Complaint  Patient presents with  . Hospitalization Follow-up    Pt. stated she her vomitting stop, but her dizziness is still there. Pt. stated she have a buzziness in her left ear.    HPI Carrie Buchanan 42 y.o. female presents to office today  for hospital follow up. VRI was used to communicate directly with patient for the entire encounter including providing detailed patient instructions.   Dizziness She was seen in the ED on 04-04-2018 with complaints of dizziness and one episode of vomiting with initial onset a few days prior to the ED visit. Aside from an elevated WBC her work up was considered benign (no imaging was performed).   She was diagnosed with vertigo and prescribed meclizine and zofran.  Today she endorses one episode of vomiting since her ED visit. The episode of vomiting occurred today and was relieved with zofran. She continue to experience dizziness which resolves after she takes meclizine but returns hours later. Aggravating factors: Sudden movements, head turning and sometimes walking. She denies any falls, headaches, visual disturbances or neurological deficits.      Review of Systems  Constitutional: Negative for fever, malaise/fatigue and weight loss.  HENT: Positive for tinnitus. Negative for nosebleeds.   Eyes: Negative.  Negative for blurred vision,  double vision and photophobia.  Respiratory: Negative.  Negative for cough and shortness of breath.   Cardiovascular: Negative.  Negative for chest pain, palpitations and leg swelling.  Gastrointestinal: Positive for vomiting. Negative for abdominal pain, heartburn and nausea.  Musculoskeletal: Negative for falls.  Neurological: Positive for dizziness. Negative for focal weakness, seizures and headaches.  Psychiatric/Behavioral: Negative.  Negative for suicidal ideas.    Past Medical History:  Diagnosis Date  . Appendicitis, acute, with peritonitis 02/20/2013  . Medical history non-contributory   . Prediabetes     Past Surgical History:  Procedure Laterality Date  . APPENDECTOMY    . INTRAUTERINE DEVICE INSERTION  09/2012  . LAPAROSCOPIC APPENDECTOMY N/A 02/09/2013   Procedure: APPENDECTOMY LAPAROSCOPIC;  Surgeon: Emelia Loron, MD;  Location: [facility] OR;  Service: General;  Laterality: N/A;    Family History  Problem Relation Age of Onset  . Asthma Mother     Social History Reviewed with no changes to be made today.   Outpatient Medications Prior to Visit  Medication Sig Dispense Refill  . Ascorbic Acid (VITAMIN C) 100 MG tablet Take 100 mg by mouth daily.    . Cholecalciferol (VITAMIN D3) 2000 units CHEW Chew by mouth.    . meclizine (ANTIVERT) 25 MG tablet Take 1 tablet (25 mg total) by mouth 3 (three) times daily as needed for dizziness. 30 tablet 0  . metFORMIN (GLUCOPHAGE XR) 500 MG 24 hr tablet Take 1 tablet (500 mg total) by mouth daily with breakfast. 90 tablet 3  . naproxen (NAPROSYN) 500 MG tablet Take 1 tablet (500 mg total)  by mouth 2 (two) times daily with a meal. 30 tablet 1  . ondansetron (ZOFRAN ODT) 4 MG disintegrating tablet Take 1 tablet (4 mg total) by mouth every 8 (eight) hours as needed for nausea or vomiting. 20 tablet 0  . tiZANidine (ZANAFLEX) 4 MG tablet Take 1 tablet (4 mg total) by mouth every 6 (six) hours as needed for muscle spasms. 30 tablet 1  .  Multiple Vitamins-Minerals (MULTIVITAMIN WITH MINERALS) tablet Take 1 tablet by mouth daily.    . ranitidine (ZANTAC) 150 MG tablet Take 1 tablet (150 mg total) by mouth 2 (two) times daily. (Patient not taking: Reported on 01/12/2018) 60 tablet 3   No facility-administered medications prior to visit.     No Known Allergies     Objective:    BP 116/81 (BP Location: Left Arm, Patient Position: Sitting, Cuff Size: Normal)   Pulse 65   Temp 98.6 F (37 C) (Oral)   Ht 5' 0.29" (1.531 m)   Wt 151 lb 9.6 oz (68.8 kg)   SpO2 100%   BMI 29.33 kg/m  Wt Readings from Last 3 Encounters:  04/13/18 151 lb 9.6 oz (68.8 kg)  04/04/18 150 lb (68 kg)  01/12/18 154 lb (69.9 kg)    Physical Exam  Constitutional: She is oriented to person, place, and time. She appears well-developed and well-nourished. She is cooperative.  HENT:  Head: Normocephalic and atraumatic.  Right Ear: A middle ear effusion is present.  Left Ear: A middle ear effusion is present.  Eyes: EOM are normal.  Neck: Normal range of motion.  Cardiovascular: Normal rate, regular rhythm and normal heart sounds. Exam reveals no gallop and no friction rub.  No murmur heard. Pulmonary/Chest: Effort normal and breath sounds normal. No stridor. No tachypnea. No respiratory distress. She has no decreased breath sounds. She has no wheezes. She has no rhonchi. She has no rales. She exhibits no tenderness.  Abdominal: Bowel sounds are normal.  Musculoskeletal: Normal range of motion. She exhibits no edema.  Neurological: She is alert and oriented to person, place, and time. She displays a negative Romberg sign. Coordination and gait normal.  Skin: Skin is warm and dry.  Psychiatric: She has a normal mood and affect. Her behavior is normal. Judgment and thought content normal.  Nursing note and vitals reviewed.     Patient has been counseled extensively about nutrition and exercise as well as the importance of adherence with medications  and regular follow-up. The patient was given clear instructions to go to ER or return to medical center if symptoms don't improve, worsen or new problems develop. The patient verbalized understanding.   Follow-up: Return in about 3 weeks (around 05/04/2018) for see below.   Claiborne RiggZelda W Breda Bond, FNP-BC [facility] [place], [name] [phone]   04/13/2018, 12:10 PM

## 2018-04-20 ENCOUNTER — Ambulatory Visit: Payer: Self-pay | Admitting: Nurse Practitioner

## 2018-04-29 ENCOUNTER — Ambulatory Visit: Payer: Self-pay | Attending: Family Medicine

## 2018-05-11 ENCOUNTER — Encounter: Payer: Self-pay | Admitting: Nurse Practitioner

## 2018-05-11 ENCOUNTER — Ambulatory Visit: Payer: Self-pay | Attending: Nurse Practitioner | Admitting: Nurse Practitioner

## 2018-05-11 VITALS — BP 111/77 | HR 62 | Temp 98.5°F | Ht 60.0 in | Wt 152.6 lb

## 2018-05-11 DIAGNOSIS — R42 Dizziness and giddiness: Secondary | ICD-10-CM | POA: Insufficient documentation

## 2018-05-11 DIAGNOSIS — D72829 Elevated white blood cell count, unspecified: Secondary | ICD-10-CM | POA: Insufficient documentation

## 2018-05-11 DIAGNOSIS — R7303 Prediabetes: Secondary | ICD-10-CM | POA: Insufficient documentation

## 2018-05-11 DIAGNOSIS — Z124 Encounter for screening for malignant neoplasm of cervix: Secondary | ICD-10-CM | POA: Insufficient documentation

## 2018-05-11 LAB — GLUCOSE, POCT (MANUAL RESULT ENTRY): POC Glucose: 130 mg/dl — AB (ref 70–99)

## 2018-05-11 MED ORDER — MECLIZINE HCL 25 MG PO TABS: 25.0000 mg | ORAL_TABLET | Freq: Three times a day (TID) | ORAL | 1 refills | Status: DC | PRN

## 2018-05-11 MED FILL — MECLIZINE 25 MG TABLET: 25 | 10 days supply | Qty: 30 | Fill #0

## 2018-05-11 NOTE — Progress Notes (Signed)
Assessment & Plan:  Carrie Buchanan was seen today for follow-up.  Diagnoses and all orders for this visit:  Encounter for Papanicolaou smear for cervical cancer screening -     Cytology - PAP  Prediabetes -     Glucose (CBG) Active. Controlled.  Continue blood sugar control as discussed in office today, low carbohydrate diet, and regular physical exercise as tolerated, 150 minutes per week (30 min each day, 5 days per week, or 50 min 3 days per week). Keep blood sugar logs with fasting goal of 80-130 mg/dl, post prandial less than 180.  For Hypoglycemia: BS <60 and Hyperglycemia BS >400; contact the clinic ASAP. Annual eye exams and foot exams are recommended.   Leukocytosis, unspecified type -     CBC  Dizziness -     meclizine (ANTIVERT) 25 MG tablet; Take 1 tablet (25 mg total) by mouth 3 (three) times daily as needed for dizziness.    Patient has been counseled on age-appropriate routine health concerns for screening and prevention. These are reviewed and up-to-date. Referrals have been placed accordingly. Immunizations are up-to-date or declined.    Subjective:   Chief Complaint  Patient presents with  . Follow-up    Pt. is here to follow-up on dizziness. Pt. stated she is feeling good.    HPI Carrie Buchanan 42 y.o. female presents to office today for follow up to dizziness and PAP smear. She was seen in my office on 04-13-2018 for ED follow up from 04-04-2018 for dizziness. Today she reports near resolution of her symptoms of dizziness.     Review of Systems  Constitutional: Negative.  Negative for chills, fever, malaise/fatigue and weight loss.  Respiratory: Negative.  Negative for cough, shortness of breath and wheezing.   Cardiovascular: Negative.  Negative for chest pain, orthopnea and leg swelling.  Gastrointestinal: Negative for abdominal pain.  Genitourinary: Negative.  Negative for flank pain.  Skin: Negative.  Negative for rash.  Psychiatric/Behavioral: Negative  for suicidal ideas.    Past Medical History:  Diagnosis Date  . Appendicitis, acute, with peritonitis 02/20/2013  . Medical history non-contributory   . Prediabetes     Past Surgical History:  Procedure Laterality Date  . APPENDECTOMY    . INTRAUTERINE DEVICE INSERTION  09/2012  . LAPAROSCOPIC APPENDECTOMY N/A 02/09/2013   Procedure: APPENDECTOMY LAPAROSCOPIC;  Surgeon: Emelia LoronMatthew Wakefield, MD;  [facility];  Service: General;  Laterality: N/A;    Family History  Problem Relation Age of Onset  . Asthma Mother     Social History Reviewed with no changes to be made today.   Outpatient Medications Prior to Visit  Medication Sig Dispense Refill  . Ascorbic Acid (VITAMIN C) 100 MG tablet Take 100 mg by mouth daily.    . Cholecalciferol (VITAMIN D3) 2000 units CHEW Chew by mouth.    . fluticasone (FLONASE) 50 MCG/ACT nasal spray Place 2 sprays into both nostrils daily. 16 g 6  . metFORMIN (GLUCOPHAGE XR) 500 MG 24 hr tablet Take 1 tablet (500 mg total) by mouth daily with breakfast. 90 tablet 3  . Multiple Vitamins-Minerals (MULTIVITAMIN WITH MINERALS) tablet Take 1 tablet by mouth daily.    . naproxen (NAPROSYN) 500 MG tablet Take 1 tablet (500 mg total) by mouth 2 (two) times daily with a meal. 30 tablet 1  . ondansetron (ZOFRAN ODT) 4 MG disintegrating tablet Take 1 tablet (4 mg total) by mouth every 8 (eight) hours as needed for nausea or vomiting. 20 tablet 0  .  tiZANidine (ZANAFLEX) 4 MG tablet Take 1 tablet (4 mg total) by mouth every 6 (six) hours as needed for muscle spasms. 30 tablet 1  . ranitidine (ZANTAC) 150 MG tablet Take 1 tablet (150 mg total) by mouth 2 (two) times daily. (Patient not taking: Reported on 01/12/2018) 60 tablet 3  . meclizine (ANTIVERT) 25 MG tablet Take 1 tablet (25 mg total) by mouth 3 (three) times daily as needed for dizziness. (Patient not taking: Reported on 05/11/2018) 30 tablet 0   No facility-administered medications prior to visit.     No  Known Allergies     Objective:    BP 111/77 (BP Location: Left Arm, Patient Position: Sitting, Cuff Size: Large)   Pulse 62   Temp 98.5 F (36.9 C) (Oral)   Ht 5' (1.524 m)   Wt 152 lb 9.6 oz (69.2 kg)   SpO2 99%   BMI 29.80 kg/m  Wt Readings from Last 3 Encounters:  05/11/18 152 lb 9.6 oz (69.2 kg)  04/13/18 151 lb 9.6 oz (68.8 kg)  04/04/18 150 lb (68 kg)    Physical Exam  Constitutional: She is oriented to person, place, and time. She appears well-developed and well-nourished.  HENT:  Head: Normocephalic.  Cardiovascular: Normal rate, regular rhythm and normal heart sounds.  Pulmonary/Chest: Effort normal and breath sounds normal.  Abdominal: Soft. Bowel sounds are normal. Hernia confirmed negative in the right inguinal area and confirmed negative in the left inguinal area.  Genitourinary: Rectum normal, vagina normal and uterus normal. Rectal exam shows no external hemorrhoid. No labial fusion. There is no rash, tenderness, lesion or injury on the right labia. There is no rash, tenderness, lesion or injury on the left labia. Uterus is not deviated and not enlarged. Cervix exhibits no motion tenderness and no friability. Right adnexum displays no mass, no tenderness and no fullness. Left adnexum displays no mass, no tenderness and no fullness. No erythema, tenderness or bleeding in the vagina. No foreign body in the vagina. No signs of injury around the vagina. No vaginal discharge found.  Lymphadenopathy: No inguinal adenopathy noted on the right or left side.       Right: No inguinal adenopathy present.       Left: No inguinal adenopathy present.  Neurological: She is alert and oriented to person, place, and time.  Skin: Skin is warm and dry.  Psychiatric: She has a normal mood and affect. Her behavior is normal. Judgment and thought content normal.       Patient has been counseled extensively about nutrition and exercise as well as the importance of adherence with  medications and regular follow-up. The patient was given clear instructions to go to ER or return to medical center if symptoms don't improve, worsen or new problems develop. The patient verbalized understanding.   Follow-up: Return in about 3 months (around 08/11/2018) for prediabetes.   Claiborne Rigg, FNP-BC [facility] [place], [place] [phone]   05/11/2018, 9:09 AM

## 2018-05-12 LAB — CBC
HEMATOCRIT: 39.4 % (ref 34.0–46.6)
HEMOGLOBIN: 12.9 g/dL (ref 11.1–15.9)
MCH: 31 pg (ref 26.6–33.0)
MCHC: 32.7 g/dL (ref 31.5–35.7)
MCV: 95 fL (ref 79–97)
Platelets: 283 10*3/uL (ref 150–450)
RBC: 4.16 x10E6/uL (ref 3.77–5.28)
RDW: 13.7 % (ref 12.3–15.4)
WBC: 8.3 10*3/uL (ref 3.4–10.8)

## 2018-05-13 LAB — CYTOLOGY - PAP
BACTERIAL VAGINITIS: POSITIVE — AB
Candida vaginitis: NEGATIVE
Chlamydia: NEGATIVE
Diagnosis: NEGATIVE
HPV (WINDOPATH): NOT DETECTED
Neisseria Gonorrhea: NEGATIVE
TRICH (WINDOWPATH): NEGATIVE

## 2018-05-15 ENCOUNTER — Other Ambulatory Visit: Payer: Self-pay | Admitting: Nurse Practitioner

## 2018-05-15 MED ORDER — METRONIDAZOLE 500 MG PO TABS: 500.0000 mg | ORAL_TABLET | Freq: Two times a day (BID) | ORAL | 0 refills | Status: AC

## 2018-05-16 MED FILL — ?METRONIDAZOLE 500MG TABS: 500 | 7 days supply | Qty: 14 | Fill #0

## 2018-05-18 ENCOUNTER — Telehealth: Payer: Self-pay

## 2018-05-18 NOTE — Telephone Encounter (Signed)
-----   Message from Claiborne RiggZelda W Fleming, NP sent at 05/15/2018 11:34 AM EDT ----- Your PAP smear does not show any cervical cancer cells however it does show bacterial vaginosis. Will send prescription to pharmacy. Your WBC count is back down to normal as well from your blood work.

## 2018-05-18 NOTE — Telephone Encounter (Signed)
CMA spoke to patient to inform on lab results.  Pt. Verified DOB. Pt. Understood.  Spanish interpreter Ephriam KnucklesChristian [phone] assist with the call.

## 2018-07-13 ENCOUNTER — Ambulatory Visit: Payer: Self-pay

## 2018-08-26 ENCOUNTER — Telehealth: Payer: Self-pay | Admitting: Nurse Practitioner

## 2018-08-26 NOTE — Telephone Encounter (Signed)
Will route to PCP.  Pt. Stated one of her molars is giving her pain.

## 2018-08-26 NOTE — Telephone Encounter (Signed)
Patient came in person to get a referral for a dentist. Patient has OC. Patient was explained the dental has a long wait and are only taking urgent cases. Please follow up.

## 2018-08-28 ENCOUNTER — Other Ambulatory Visit: Payer: Self-pay | Admitting: Nurse Practitioner

## 2018-08-28 DIAGNOSIS — K0889 Other specified disorders of teeth and supporting structures: Secondary | ICD-10-CM

## 2018-08-28 NOTE — Telephone Encounter (Signed)
Referral has been placed. 

## 2018-08-31 NOTE — Telephone Encounter (Signed)
CMA attempt to reach patient to inform her dental referral has been placed. No answer and left  Vm.

## 2018-09-07 MED FILL — METFORMIN HCL ER 500 MG TAB: 500 | 30 days supply | Qty: 30 | Fill #2

## 2018-10-05 ENCOUNTER — Ambulatory Visit: Payer: Self-pay | Attending: Family Medicine | Admitting: Physician Assistant

## 2018-10-05 VITALS — BP 106/67 | HR 80 | Temp 98.3°F | Resp 18 | Ht 62.0 in

## 2018-10-05 DIAGNOSIS — Z7984 Long term (current) use of oral hypoglycemic drugs: Secondary | ICD-10-CM | POA: Insufficient documentation

## 2018-10-05 DIAGNOSIS — M6740 Ganglion, unspecified site: Secondary | ICD-10-CM

## 2018-10-05 DIAGNOSIS — M67431 Ganglion, right wrist: Secondary | ICD-10-CM | POA: Insufficient documentation

## 2018-10-05 DIAGNOSIS — R220 Localized swelling, mass and lump, head: Secondary | ICD-10-CM | POA: Insufficient documentation

## 2018-10-05 DIAGNOSIS — R7303 Prediabetes: Secondary | ICD-10-CM | POA: Insufficient documentation

## 2018-10-05 DIAGNOSIS — Z79899 Other long term (current) drug therapy: Secondary | ICD-10-CM | POA: Insufficient documentation

## 2018-10-05 LAB — POCT GLYCOSYLATED HEMOGLOBIN (HGB A1C): HbA1c, POC (prediabetic range): 5.9 % (ref 5.7–6.4)

## 2018-10-05 LAB — GLUCOSE, POCT (MANUAL RESULT ENTRY): POC Glucose: 191 mg/dl — AB (ref 70–99)

## 2018-10-05 MED ORDER — NAPROXEN 500 MG PO TABS: 500.0000 mg | ORAL_TABLET | Freq: Two times a day (BID) | ORAL | 1 refills | Status: DC

## 2018-10-05 NOTE — Progress Notes (Signed)
Patient ID: Carrie Buchanan, female   DOB: 10/20/1976, 42 y.o.   MRN: [id number]       Carrie Buchanan, is a 42 y.o. female  [id number]  [id number]  DOB - 06/03/1976  Subjective:  Chief Complaint and HPI: Carrie Buchanan is a 42 y.o. female here today for body soreness and RF on Naproxen.  Also, noticed a lump behind L ear yesterday.  Slightly tender.  Blood sugars overall doing ok.  "Barbara" with [employer] interpreters   ROS:   Constitutional:  No f/c, No night sweats, No unexplained weight loss. EENT:  No vision changes, No blurry vision, No hearing changes. No mouth, throat, or ear problems.  Respiratory: No cough, No SOB Cardiac: No CP, no palpitations GI:  No abd pain, No N/V/D. GU: No Urinary s/sx Musculoskeletal: + all over body aches Neuro: No headache, no dizziness, no motor weakness.  Skin: No rash Endocrine:  No polydipsia. No polyuria.  Psych: Denies SI/HI  No problems updated.  ALLERGIES: No Known Allergies  PAST MEDICAL HISTORY: Past Medical History:  Diagnosis Date  . Appendicitis, acute, with peritonitis 02/20/2013  . Medical history non-contributory   . Prediabetes     MEDICATIONS AT HOME: Prior to Admission medications   Medication Sig Start Date End Date Taking? Authorizing Provider  Ascorbic Acid (VITAMIN C) 100 MG tablet Take 100 mg by mouth daily.   Yes [provider]  Cholecalciferol (VITAMIN D3) 2000 units CHEW Chew by mouth.   Yes [provider]  fluticasone (FLONASE) 50 MCG/ACT nasal spray Place 2 sprays into both nostrils daily. 04/13/18  Yes Claiborne RiggFleming, [name], NP  meclizine (ANTIVERT) 25 MG tablet Take 1 tablet (25 mg total) by mouth 3 (three) times daily as needed for dizziness. 05/11/18  Yes Claiborne RiggFleming, [name], NP  metFORMIN (GLUCOPHAGE XR) 500 MG 24 hr tablet Take 1 tablet (500 mg total) by mouth daily with breakfast. 12/17/17  Yes Storm FriskWright, Patrick E, MD  Multiple Vitamins-Minerals (MULTIVITAMIN WITH  MINERALS) tablet Take 1 tablet by mouth daily.   Yes [provider]  naproxen (NAPROSYN) 500 MG tablet Take 1 tablet (500 mg total) by mouth 2 (two) times daily with a meal. 10/05/18  Yes Arty Lantzy M, PA-C  tiZANidine (ZANAFLEX) 4 MG tablet Take 1 tablet (4 mg total) by mouth every 6 (six) hours as needed for muscle spasms. 01/12/18  Yes Claiborne RiggFleming, [name], NP  ondansetron (ZOFRAN ODT) 4 MG disintegrating tablet Take 1 tablet (4 mg total) by mouth every 8 (eight) hours as needed for nausea or vomiting. Patient not taking: Reported on 10/05/2018 04/04/18   Horton, Mayer Maskerourtney F, MD  ranitidine (ZANTAC) 150 MG tablet Take 1 tablet (150 mg total) by mouth 2 (two) times daily. Patient not taking: Reported on 01/12/2018 04/08/17   Anders SimmondsMcClung, Aksh Swart M, PA-C     Objective:  EXAM:   Vitals:   10/05/18 1611  BP: 106/67  Pulse: 80  Resp: 18  Temp: 98.3 F (36.8 C)  TempSrc: Oral  Height: 5\' 2"  (1.575 m)    General appearance : A&OX3. NAD. Non-toxic-appearing HEENT: Atraumatic and Normocephalic.  PERRLA. EOM intact.  TM clear B.  Firm and fixed 1 cm nodule on L mastoid(does not feel fluctuant or cystic).  No erythema or sign of infection Mouth-MMM, post pharynx WNL w/o erythema, No PND. Neck: supple, no JVD. No cervical lymphadenopathy. No thyromegaly Chest/Lungs:  Breathing-non-labored, Good air entry bilaterally, breath sounds normal without rales, rhonchi, or wheezing  CVS: S1 S2  regular, no murmurs, gallops, rubs  Extremities: Bilateral Lower Ext shows no edema, both legs are warm to touch with = pulse throughout Neurology:  CN II-XII grossly intact, Non focal.   Psych:  TP linear. J/I WNL. Normal speech. Appropriate eye contact and affect.  Skin:  No Rash  Data Review Lab Results  Component Value Date   HGBA1C 5.9 10/05/2018   HGBA1C 5.8 01/12/2018   HGBA1C 5.8 12/08/2016     Assessment & Plan   1. Lump of scalp/mass on mastoid Also examined by Dr Alvis Lemmings - naproxen  (NAPROSYN) 500 MG tablet; Take 1 tablet (500 mg total) by mouth 2 (two) times daily with a meal.  Dispense: 60 tablet; Refill: 1 - DG Mastoid 3 Views; Future  2. Ganglion cyst R wrist.  Refer to ortho  3. Prediabetes Almost controlled-work on diet and continue current regimen - Glucose (CBG) - HgB A1c  RF naprosyn  Patient have been counseled extensively about nutrition and exercise  Return in about 2 weeks (around 10/19/2018) for with me or Dr Jule Economy behind ear.  The patient was given clear instructions to go to ER or return to medical center if symptoms don't improve, worsen or new problems develop. The patient verbalized understanding. The patient was told to call to get lab results if they haven't heard anything in the next week.     Georgian Co, PA-C [facility] [place], [place] [phone]   10/05/2018, 4:42 PM

## 2018-10-06 MED FILL — NAPROXEN 500 MG TABLET: 500 | 30 days supply | Qty: 60 | Fill #0

## 2018-10-11 ENCOUNTER — Ambulatory Visit (INDEPENDENT_AMBULATORY_CARE_PROVIDER_SITE_OTHER): Payer: Self-pay

## 2018-10-11 ENCOUNTER — Ambulatory Visit (INDEPENDENT_AMBULATORY_CARE_PROVIDER_SITE_OTHER): Payer: Self-pay | Admitting: Orthopaedic Surgery

## 2018-10-11 DIAGNOSIS — M67431 Ganglion, right wrist: Secondary | ICD-10-CM

## 2018-10-11 NOTE — Progress Notes (Signed)
Office Visit Note   Patient: Carrie Buchanan           Date of Birth: 01/26/1976           MRN: [id number] Visit Date: 10/11/2018              Requested by: Anders SimmondsMcClung, Angela M, PA-C [address] [place], [name] [id number] PCP: Claiborne RiggFleming, [name], NP   Assessment & Plan: Visit Diagnoses:  1. Ganglion cyst of dorsum of right wrist     Plan: Right wrist  ganglion cyst - xrays obtained today show good carpal alignment and no significant arthritis. It is possible she had partial tear of SL ligament following her accident predisposing to this cyst formation. Discussed options of continued monitoring, drainage and surgery. Pt elects to continue to monitor at this time. Will provide with volar wrist brace for support. NSAIDs as needed. Follow up as needed Total face to face encounter time was greater than 45 minutes and over half of this time was spent in counseling and/or coordination of care.  Follow-Up Instructions: Return if symptoms worsen or fail to improve.   Orders:  Orders Placed This Encounter  Procedures  . XR Wrist Complete Right   No orders of the defined types were placed in this encounter.     Procedures: No procedures performed   Clinical Data: No additional findings.   Subjective: Chief Complaint  Patient presents with  . Neck - Pain    HPI  [age] y/o female with right wrist pain/swelling for 6 months. Referred by her PCP for ganglion cyst. The attributes this cyst to an injury at work in Feb 2018 at which time she strained her back while carrying a backpack vacuum. She was in an elevator and reports the cord was caught outside the vacuum as it began to ascend and she was pulled to the ground.. She reports pain worse with repetitive flexion/extension. Better with rest. No erythema or bruising. No numbness or tingling. No h/o cysts. Denies any specific trauma to her wrist. Pt is spanish speaking and accompanied today by interpreter.  Review of Systems See  hpi   Objective: Vital Signs: There were no vitals taken for this visit.  Physical Exam  GEN: awake, alert, NAD  Ortho Exam  Righ tHand: Inspection: dorsal swelling of wrist, well demarcated. Located distal to radius. Palpation: no TTP ROM: Full ROM of the digits and wrist Strength: 5/5 strength in the forearm, wrist and interosseus muscles Neurovascular: NV intact Special tests: Negative finkelstein's, negative tinel's at the carpal tunnel,    Specialty Comments:  No specialty comments available.  Imaging: Xr Wrist Complete Right  Result Date: 10/11/2018 No acute or structural abnormalities    PMFS History: Patient Active Problem List   Diagnosis Date Noted  . IFG (impaired fasting glucose) 07/11/2014  . High triglycerides 07/11/2014  . Appendicitis, acute, with peritonitis 02/20/2013  . Twin pregnancy, delivered vaginally, current hospitalization 04/21/2012  . Multiparity 02/18/2012  . Short cervix, antepartum, twins 01/14/2012  . Twin pregnancy, antepartum 01/01/2012  . AMA (advanced maternal age) multigravida 35+ 01/01/2012  . Late prenatal care 01/01/2012  . High-risk pregnancy supervision 01/01/2012   Past Medical History:  Diagnosis Date  . Appendicitis, acute, with peritonitis 02/20/2013  . Medical history non-contributory   . Prediabetes     Family History  Problem Relation Age of Onset  . Asthma Mother     Past Surgical History:  Procedure Laterality Date  . APPENDECTOMY    .  INTRAUTERINE DEVICE INSERTION  09/2012  . LAPAROSCOPIC APPENDECTOMY N/A 02/09/2013   Procedure: APPENDECTOMY LAPAROSCOPIC;  Surgeon: Emelia Loron, MD;  Location: [facility] OR;  Service: General;  Laterality: N/A;   Social History   Occupational History  . Not on file  Tobacco Use  . Smoking status: Never Smoker  . Smokeless tobacco: Never Used  Substance and Sexual Activity  . Alcohol use: No  . Drug use: No  . Sexual activity: Not Currently    Birth  control/protection: IUD

## 2018-10-14 ENCOUNTER — Ambulatory Visit: Payer: Self-pay | Attending: Nurse Practitioner

## 2018-10-31 ENCOUNTER — Ambulatory Visit: Payer: Self-pay | Attending: Nurse Practitioner

## 2018-11-21 ENCOUNTER — Ambulatory Visit: Payer: Self-pay | Attending: Nurse Practitioner | Admitting: Nurse Practitioner

## 2018-11-21 ENCOUNTER — Encounter: Payer: Self-pay | Admitting: Nurse Practitioner

## 2018-11-21 VITALS — BP 122/85 | HR 73 | Temp 98.4°F | Resp 16 | Wt 153.0 lb

## 2018-11-21 DIAGNOSIS — H748X2 Other specified disorders of left middle ear and mastoid: Secondary | ICD-10-CM

## 2018-11-21 DIAGNOSIS — Z791 Long term (current) use of non-steroidal anti-inflammatories (NSAID): Secondary | ICD-10-CM | POA: Insufficient documentation

## 2018-11-21 DIAGNOSIS — G8929 Other chronic pain: Secondary | ICD-10-CM | POA: Insufficient documentation

## 2018-11-21 DIAGNOSIS — Z7984 Long term (current) use of oral hypoglycemic drugs: Secondary | ICD-10-CM | POA: Insufficient documentation

## 2018-11-21 DIAGNOSIS — M62838 Other muscle spasm: Secondary | ICD-10-CM | POA: Insufficient documentation

## 2018-11-21 DIAGNOSIS — H9202 Otalgia, left ear: Secondary | ICD-10-CM | POA: Insufficient documentation

## 2018-11-21 DIAGNOSIS — Y990 Civilian activity done for income or pay: Secondary | ICD-10-CM | POA: Insufficient documentation

## 2018-11-21 DIAGNOSIS — R7303 Prediabetes: Secondary | ICD-10-CM | POA: Insufficient documentation

## 2018-11-21 DIAGNOSIS — M2550 Pain in unspecified joint: Secondary | ICD-10-CM | POA: Insufficient documentation

## 2018-11-21 DIAGNOSIS — M25619 Stiffness of unspecified shoulder, not elsewhere classified: Secondary | ICD-10-CM | POA: Insufficient documentation

## 2018-11-21 DIAGNOSIS — Z79899 Other long term (current) drug therapy: Secondary | ICD-10-CM | POA: Insufficient documentation

## 2018-11-21 DIAGNOSIS — M542 Cervicalgia: Secondary | ICD-10-CM | POA: Insufficient documentation

## 2018-11-21 LAB — GLUCOSE, POCT (MANUAL RESULT ENTRY): POC Glucose: 135 mg/dl — AB (ref 70–99)

## 2018-11-21 MED ORDER — TIZANIDINE HCL 4 MG PO TABS: 4.0000 mg | ORAL_TABLET | Freq: Four times a day (QID) | ORAL | 1 refills | Status: AC | PRN

## 2018-11-21 MED FILL — tiZANidine HCL 4 MG TABS: 4 | 15 days supply | Qty: 60 | Fill #0

## 2018-11-21 NOTE — Progress Notes (Signed)
Assessment & Plan:  [name] was seen today for follow-up.  Diagnoses and all orders for this visit:  Chronic neck pain -     DG Cervical Spine Complete; Future -     tiZANidine (ZANAFLEX) 4 MG tablet; Take 1 tablet (4 mg total) by mouth every 6 (six) hours as needed for up to 30 days for muscle spasms. Work on losing weight to help reduce joint pain. May alternate with heat and ice application for pain relief.  Other alternatives include massage, acupuncture and water aerobics.  You must stay active and avoid a sedentary lifestyle.  Mastoid pain, left -     DG Mastoid 3 Views; Future  Prediabetes -     POCT glucose (manual entry) Continue blood sugar control as discussed in office today, low carbohydrate diet, and regular physical exercise as tolerated, 150 minutes per week (30 min each day, 5 days per week, or 50 min 3 days per week). Annual eye exams and foot exams are recommended.   Patient has been counseled on age-appropriate routine health concerns for screening and prevention. These are reviewed and up-to-date. Referrals have been placed accordingly. Immunizations are up-to-date or declined.    Subjective:   Chief Complaint  Patient presents with  . Follow-up   HPI Carrie Buchanan 43 y.o. female presents to office today for follow up to left mastoid mass. VRI was used to communicate directly with patient for the entire encounter including providing detailed patient instructions.   Mastoid Mass Mass as been present for over 1 month. She was evaluated last month for the mass by another provider. Xray was ordered however she did not have it performed. She also was prescribed naproxen which she reports has provided some pain relief however the nodule is still present.     Neck Pain She endorses neck pain that radiates to the frontal scalp area. Endorses a work related injury 2 years ago.  She was carrying a vacuum cleaner on her back and the vacuum cleaner cord got stuck in  the moving elevator. She attempted to catch the cord however she was unsuccessful and ended up falling with the weight of the vacuum cleaner around her shoulders and neck.  She feels a pulling sensation in her neck with turning her neck  She takes naproxen for her neck pain which provides significant improvement of her neck pain. There is also associated bilateral shoulder stiffness. She denies any fevers, nausea, vomiting or sciatica.   Review of Systems  Constitutional: Negative for fever, malaise/fatigue and weight loss.  HENT: Negative.  Negative for nosebleeds.   Eyes: Negative.  Negative for blurred vision, double vision and photophobia.  Respiratory: Negative.  Negative for cough and shortness of breath.   Cardiovascular: Negative.  Negative for chest pain, palpitations and leg swelling.  Gastrointestinal: Negative.  Negative for heartburn, nausea and vomiting.  Musculoskeletal: Positive for myalgias and neck pain.  Neurological: Negative.  Negative for dizziness, focal weakness, seizures and headaches.  Psychiatric/Behavioral: Negative.  Negative for suicidal ideas.    Past Medical History:  Diagnosis Date  . Appendicitis, acute, with peritonitis 02/20/2013  . Medical history non-contributory   . Prediabetes     Past Surgical History:  Procedure Laterality Date  . APPENDECTOMY    . INTRAUTERINE DEVICE INSERTION  09/2012  . LAPAROSCOPIC APPENDECTOMY N/A 02/09/2013   Procedure: APPENDECTOMY LAPAROSCOPIC;  Surgeon: Emelia Loron, MD;  Location: [facility] OR;  Service: General;  Laterality: N/A;    Family History  Problem  Relation Age of Onset  . Asthma Mother     Social History Reviewed with no changes to be made today.   Outpatient Medications Prior to Visit  Medication Sig Dispense Refill  . Ascorbic Acid (VITAMIN C) 100 MG tablet Take 100 mg by mouth daily.    . Cholecalciferol (VITAMIN D3) 2000 units CHEW Chew by mouth.    . fluticasone (FLONASE) 50 MCG/ACT nasal spray  Place 2 sprays into both nostrils daily. 16 g 6  . meclizine (ANTIVERT) 25 MG tablet Take 1 tablet (25 mg total) by mouth 3 (three) times daily as needed for dizziness. 30 tablet 1  . metFORMIN (GLUCOPHAGE XR) 500 MG 24 hr tablet Take 1 tablet (500 mg total) by mouth daily with breakfast. 90 tablet 3  . Multiple Vitamins-Minerals (MULTIVITAMIN WITH MINERALS) tablet Take 1 tablet by mouth daily.    . naproxen (NAPROSYN) 500 MG tablet Take 1 tablet (500 mg total) by mouth 2 (two) times daily with a meal. 60 tablet 1  . ondansetron (ZOFRAN ODT) 4 MG disintegrating tablet Take 1 tablet (4 mg total) by mouth every 8 (eight) hours as needed for nausea or vomiting. (Patient not taking: Reported on 10/05/2018) 20 tablet 0  . ranitidine (ZANTAC) 150 MG tablet Take 1 tablet (150 mg total) by mouth 2 (two) times daily. (Patient not taking: Reported on 01/12/2018) 60 tablet 3  . tiZANidine (ZANAFLEX) 4 MG tablet Take 1 tablet (4 mg total) by mouth every 6 (six) hours as needed for muscle spasms. 30 tablet 1   No facility-administered medications prior to visit.     No Known Allergies     Objective:    BP 122/85   Pulse 73   Temp 98.4 F (36.9 C) (Oral)   Resp 16   Wt 153 lb (69.4 kg)   SpO2 98%   BMI 27.98 kg/m  Wt Readings from Last 3 Encounters:  11/21/18 153 lb (69.4 kg)  05/11/18 152 lb 9.6 oz (69.2 kg)  04/13/18 151 lb 9.6 oz (68.8 kg)    Physical Exam Vitals signs and nursing note reviewed.  Constitutional:      Appearance: She is well-developed.  HENT:     Head: Normocephalic and atraumatic.      Right Ear: Hearing and ear canal normal. A middle ear effusion is present.     Left Ear: Hearing and ear canal normal. A middle ear effusion is present. There is mastoid tenderness.  Neck:     Musculoskeletal: Normal range of motion.  Cardiovascular:     Rate and Rhythm: Normal rate and regular rhythm.     Heart sounds: Normal heart sounds. No murmur. No friction rub. No gallop.     Pulmonary:     Effort: Pulmonary effort is normal. No tachypnea or respiratory distress.     Breath sounds: Normal breath sounds. No decreased breath sounds, wheezing, rhonchi or rales.  Chest:     Chest wall: No tenderness.  Abdominal:     General: Bowel sounds are normal.     Palpations: Abdomen is soft.  Musculoskeletal: Normal range of motion.     Cervical back: She exhibits tenderness (TTP ). She exhibits normal range of motion, no bony tenderness, no swelling, no edema, no deformity, no laceration and no pain.       Back:  Skin:    General: Skin is warm and dry.  Neurological:     Mental Status: She is alert and oriented to person, place, and  time.     Coordination: Coordination normal.  Psychiatric:        Behavior: Behavior normal. Behavior is cooperative.        Thought Content: Thought content normal.        Judgment: Judgment normal.          Patient has been counseled extensively about nutrition and exercise as well as the importance of adherence with medications and regular follow-up. The patient was given clear instructions to go to ER or return to medical center if symptoms don't improve, worsen or new problems develop. The patient verbalized understanding.   Follow-up: Return in about 7 weeks (around 01/09/2019) for prediabetes, neck pain.   Claiborne RiggZelda W Itzelle Gains, FNP-BC [facility] [place], [name] [phone]   11/21/2018, 3:31 PM

## 2018-11-23 ENCOUNTER — Other Ambulatory Visit: Payer: Self-pay | Admitting: Nurse Practitioner

## 2018-11-23 ENCOUNTER — Telehealth: Payer: Self-pay

## 2018-11-23 ENCOUNTER — Ambulatory Visit (HOSPITAL_COMMUNITY)
Admission: RE | Admit: 2018-11-23 | Discharge: 2018-11-23 | Disposition: A | Discharge status: Home / Self Care | Payer: Self-pay | Source: Ambulatory Visit | Attending: Nurse Practitioner | Admitting: Nurse Practitioner

## 2018-11-23 DIAGNOSIS — H9202 Otalgia, left ear: Secondary | ICD-10-CM | POA: Insufficient documentation

## 2018-11-23 DIAGNOSIS — G8929 Other chronic pain: Secondary | ICD-10-CM | POA: Insufficient documentation

## 2018-11-23 DIAGNOSIS — M542 Cervicalgia: Secondary | ICD-10-CM | POA: Insufficient documentation

## 2018-11-23 NOTE — Telephone Encounter (Signed)
Rana called from radiology and informed me that they do not do xray of mastoids. Rana was informed that provider is out of office and order will be changed to a CT scan of the head once provider returns. I also informed rana that patient will be called once ct is changed and schedule.

## 2018-11-28 ENCOUNTER — Other Ambulatory Visit: Payer: Self-pay | Admitting: Nurse Practitioner

## 2018-11-28 DIAGNOSIS — R590 Localized enlarged lymph nodes: Secondary | ICD-10-CM

## 2018-11-28 NOTE — Telephone Encounter (Signed)
Thank you. Please schedule for [place]

## 2018-12-01 ENCOUNTER — Telehealth: Payer: Self-pay | Admitting: Nurse Practitioner

## 2018-12-01 NOTE — Telephone Encounter (Signed)
Called patient and informed her of her ultrasound appt on 12/07/18 @ 12:30pm and to arrive at 12:15pm.

## 2018-12-01 NOTE — Telephone Encounter (Signed)
Ultrasound has been scheduled for 2/5/2020at 12:30

## 2018-12-07 ENCOUNTER — Ambulatory Visit (HOSPITAL_COMMUNITY)
Admission: RE | Admit: 2018-12-07 | Discharge: 2018-12-07 | Disposition: A | Discharge status: Home / Self Care | Payer: Self-pay | Source: Ambulatory Visit | Attending: Nurse Practitioner | Admitting: Nurse Practitioner

## 2018-12-07 DIAGNOSIS — R590 Localized enlarged lymph nodes: Secondary | ICD-10-CM | POA: Insufficient documentation

## 2019-01-03 ENCOUNTER — Other Ambulatory Visit: Payer: Self-pay | Admitting: Nurse Practitioner

## 2019-01-03 ENCOUNTER — Telehealth: Payer: Self-pay | Admitting: Nurse Practitioner

## 2019-01-03 DIAGNOSIS — K089 Disorder of teeth and supporting structures, unspecified: Secondary | ICD-10-CM

## 2019-01-03 NOTE — Telephone Encounter (Signed)
Will route to PCP 

## 2019-01-03 NOTE — Telephone Encounter (Signed)
[place] normal. Normal lymph node. No additional work up needed at this time.

## 2019-01-03 NOTE — Telephone Encounter (Signed)
Patient states she would like to get her [place] results for her head/neck. Please follow up

## 2019-01-03 NOTE — Telephone Encounter (Signed)
Patient states she would like to get a dental referral. Please follow up.

## 2019-01-05 NOTE — Telephone Encounter (Signed)
CMA spoke to patient to inform on Ultrasound results.  Pt. Understood. Pt. Verified DOB.   [place] interpreter assist with the call.

## 2019-01-10 ENCOUNTER — Ambulatory Visit: Payer: Self-pay | Attending: Nurse Practitioner | Admitting: Nurse Practitioner

## 2019-01-10 ENCOUNTER — Encounter: Payer: Self-pay | Admitting: Nurse Practitioner

## 2019-01-10 VITALS — BP 128/85 | HR 79 | Temp 99.1°F | Ht 62.0 in | Wt 152.0 lb

## 2019-01-10 DIAGNOSIS — R7303 Prediabetes: Secondary | ICD-10-CM

## 2019-01-10 DIAGNOSIS — G8929 Other chronic pain: Secondary | ICD-10-CM

## 2019-01-10 DIAGNOSIS — M542 Cervicalgia: Secondary | ICD-10-CM

## 2019-01-10 DIAGNOSIS — J309 Allergic rhinitis, unspecified: Secondary | ICD-10-CM

## 2019-01-10 DIAGNOSIS — H1012 Acute atopic conjunctivitis, left eye: Secondary | ICD-10-CM

## 2019-01-10 LAB — GLUCOSE, POCT (MANUAL RESULT ENTRY): POC Glucose: 167 mg/dl — AB (ref 70–99)

## 2019-01-10 LAB — POCT GLYCOSYLATED HEMOGLOBIN (HGB A1C): Hemoglobin A1C: 5.9 % — AB (ref 4.0–5.6)

## 2019-01-10 MED ORDER — AZELASTINE HCL 0.05 % OP SOLN: 1.0000 [drp] | Freq: Two times a day (BID) | OPHTHALMIC | 0 refills | Status: AC

## 2019-01-10 MED ORDER — METFORMIN HCL ER 500 MG PO TB24: 500.0000 mg | ORAL_TABLET | Freq: Every day | ORAL | 3 refills | Status: DC

## 2019-01-10 MED ORDER — NAPROXEN 500 MG PO TABS: 500.0000 mg | ORAL_TABLET | Freq: Two times a day (BID) | ORAL | 1 refills | Status: DC

## 2019-01-10 MED FILL — NAPROXEN 500 MG TABLET: 500 | 30 days supply | Qty: 60 | Fill #0

## 2019-01-10 MED FILL — AZELASTINE HCL 0.05% DROPS: 0.05 | 7 days supply | Qty: 6 | Fill #0

## 2019-01-10 MED FILL — ?METFORMIN HCL ER 500MG TAB: 500 | 90 days supply | Qty: 90 | Fill #0

## 2019-01-10 NOTE — Progress Notes (Signed)
Assessment & Plan:  Carrie Buchanan was seen today for follow-up.  Diagnoses and all orders for this visit:  Chronic neck pain -     naproxen (NAPROSYN) 500 MG tablet; Take 1 tablet (500 mg total) by mouth 2 (two) times daily with a meal. -     Ambulatory referral to Physical Therapy -     CT CERVICAL SPINE WO CONTRAST; Future Work on losing weight to help reduce joint pain. May alternate with heat and ice application for pain relief. May also alternate with acetaminophen as prescribed pain relief. Other alternatives include massage and acupuncture.    Prediabetes -     Glucose (CBG) -     HgB A1c -     CBC -     CMP14+EGFR -     metFORMIN (GLUCOPHAGE XR) 500 MG 24 hr tablet; Take 1 tablet (500 mg total) by mouth daily with breakfast. Continue blood sugar control as discussed in office today, low carbohydrate diet, and regular physical exercise as tolerated, 150 minutes per week (30 min each day, 5 days per week, or 50 min 3 days per week).    Allergic conjunctivitis and rhinitis, left -     azelastine (OPTIVAR) 0.05 % ophthalmic solution; Place 1 drop into the left eye 2 (two) times daily for 7 days.    Patient has been counseled on age-appropriate routine health concerns for screening and prevention. These are reviewed and up-to-date. Referrals have been placed accordingly. Immunizations are up-to-date or declined.    Subjective:   Chief Complaint  Patient presents with  . Follow-up    Pt. is F/U on prediabetes and neck pain. Pt. stated her neck pain is not getting better.    HPI Carrie Buchanan 43 y.o. female presents to office today for follow up on chronic neck pain and  bilateral shoulder and arm pain. VRI was used to communicate directly with patient for the entire encounter including providing detailed patient instructions.  Neck Pain Chronic. She endorses a work related injury 2 years ago. She was carrying a vacuum cleaner on her back and the vacuum cleaner cord got stuck  in the moving elevator. She attempted to catch the cord however she was unsuccessful and ended up falling with the weight of the vacuum cleaner around her shoulders and neck. She felt immediate pain in her neck and shoulders after the injury.  She requires naproxen daily to help relieve pain. Tizanidine has not provided relief of pain.  Endorses a cracking sensation when turning her head from side to side.  Current symptoms are pain in posterior cervical neck, occipital area, bilateral shoulders (trapezius) (aching, crushing, sharp and stabbing in character; 6/10 in severity). Patient denies numbness, tingling  Xray Cervical Spine 11-23-2018 FINDINGS: There is no evidence of cervical spine fracture or prevertebral soft tissue swelling. Alignment is normal. No other significant bone abnormalities are identified.  IMPRESSION: Negative cervical spine radiographs.   Prediabetes Chronic and stable. Taking metformin 533m daily. Denies any hypo or hyperglycemic symptoms. She does not monitor her blood glucose levels at home. Has a good understanding of carbs and dietary modifications in regard to diabetes.  Lab Results  Component Value Date   HGBA1C 5.9 (A) 01/10/2019   Review of Systems  Constitutional: Negative for fever, malaise/fatigue and weight loss.  HENT: Negative.  Negative for nosebleeds.   Eyes: Negative.  Negative for blurred vision, double vision and photophobia.  Respiratory: Negative.  Negative for cough and shortness of breath.  Cardiovascular: Negative.  Negative for chest pain, palpitations and leg swelling.  Gastrointestinal: Negative.  Negative for heartburn, nausea and vomiting.  Musculoskeletal: Positive for joint pain, myalgias and neck pain.  Neurological: Positive for headaches. Negative for dizziness, focal weakness and seizures.  Endo/Heme/Allergies: Positive for environmental allergies.  Psychiatric/Behavioral: Negative.  Negative for suicidal ideas.    Past  Medical History:  Diagnosis Date  . Appendicitis, acute, with peritonitis 02/20/2013  . Medical history non-contributory   . Prediabetes     Past Surgical History:  Procedure Laterality Date  . APPENDECTOMY    . INTRAUTERINE DEVICE INSERTION  09/2012  . LAPAROSCOPIC APPENDECTOMY N/A 02/09/2013   Procedure: APPENDECTOMY LAPAROSCOPIC;  Surgeon: Rolm Bookbinder, MD;  Location: [facility] OR;  Service: General;  Laterality: N/A;    Family History  Problem Relation Age of Onset  . Asthma Mother     Social History Reviewed with no changes to be made today.   Outpatient Medications Prior to Visit  Medication Sig Dispense Refill  . Ascorbic Acid (VITAMIN C) 100 MG tablet Take 100 mg by mouth daily.    . Cholecalciferol (VITAMIN D3) 2000 units CHEW Chew by mouth.    . fluticasone (FLONASE) 50 MCG/ACT nasal spray Place 2 sprays into both nostrils daily. 16 g 6  . Multiple Vitamins-Minerals (MULTIVITAMIN WITH MINERALS) tablet Take 1 tablet by mouth daily.    . metFORMIN (GLUCOPHAGE XR) 500 MG 24 hr tablet Take 1 tablet (500 mg total) by mouth daily with breakfast. 90 tablet 3  . naproxen (NAPROSYN) 500 MG tablet Take 1 tablet (500 mg total) by mouth 2 (two) times daily with a meal. 60 tablet 1  . meclizine (ANTIVERT) 25 MG tablet Take 1 tablet (25 mg total) by mouth 3 (three) times daily as needed for dizziness. (Patient not taking: Reported on 01/10/2019) 30 tablet 1  . ondansetron (ZOFRAN ODT) 4 MG disintegrating tablet Take 1 tablet (4 mg total) by mouth every 8 (eight) hours as needed for nausea or vomiting. (Patient not taking: Reported on 10/05/2018) 20 tablet 0  . ranitidine (ZANTAC) 150 MG tablet Take 1 tablet (150 mg total) by mouth 2 (two) times daily. (Patient not taking: Reported on 01/12/2018) 60 tablet 3   No facility-administered medications prior to visit.     No Known Allergies     Objective:    BP 128/85 (BP Location: Left Arm, Patient Position: Sitting, Cuff Size: Normal)    Pulse 79   Temp 99.1 F (37.3 C) (Oral)   Ht _0  (1.575 m)   Wt 152 lb (68.9 kg)   SpO2 99%   BMI 27.80 kg/m  Wt Readings from Last 3 Encounters:  01/10/19 152 lb (68.9 kg)  11/21/18 153 lb (69.4 kg)  05/11/18 152 lb 9.6 oz (69.2 kg)    Physical Exam Vitals signs and nursing note reviewed.  Constitutional:      Appearance: She is well-developed.  HENT:     Head: Normocephalic and atraumatic.  Eyes:     General:        Right eye: No foreign body or discharge.        Left eye: No foreign body or discharge.     Conjunctiva/sclera:     Right eye: Right conjunctiva is not injected. No chemosis, exudate or hemorrhage.    Left eye: Left conjunctiva is injected. No chemosis, exudate or hemorrhage.  Neck:     Musculoskeletal: Crepitus, pain with movement and muscular tenderness present.  Cardiovascular:  Rate and Rhythm: Normal rate and regular rhythm.     Heart sounds: Normal heart sounds. No murmur. No friction rub. No gallop.   Pulmonary:     Effort: Pulmonary effort is normal. No tachypnea or respiratory distress.     Breath sounds: Normal breath sounds. No decreased breath sounds, wheezing, rhonchi or rales.  Chest:     Chest wall: No tenderness.  Abdominal:     General: Bowel sounds are normal.     Palpations: Abdomen is soft.  Musculoskeletal:     Right shoulder: She exhibits spasm.     Left shoulder: She exhibits spasm.     Cervical back: She exhibits pain. She exhibits no swelling, no edema and no deformity.  Lymphadenopathy:     Cervical: No cervical adenopathy.  Skin:    General: Skin is warm and dry.  Neurological:     Mental Status: She is alert and oriented to person, place, and time.     Coordination: Coordination normal.  Psychiatric:        Behavior: Behavior normal. Behavior is cooperative.        Thought Content: Thought content normal.        Judgment: Judgment normal.       Patient has been counseled extensively about nutrition and exercise  as well as the importance of adherence with medications and regular follow-up. The patient was given clear instructions to go to ER or return to medical center if symptoms don't improve, worsen or new problems develop. The patient verbalized understanding.   Follow-up: Return in about 6 weeks (around 02/21/2019).   Gildardo Pounds, FNP-BC [facility] [place], [place]   01/10/2019, 8:35 PM

## 2019-01-11 LAB — CMP14+EGFR
ALT: 18 IU/L (ref 0–32)
AST: 14 IU/L (ref 0–40)
Albumin/Globulin Ratio: 1.7 (ref 1.2–2.2)
Albumin: 4.5 g/dL (ref 3.8–4.8)
Alkaline Phosphatase: 61 IU/L (ref 39–117)
BUN/Creatinine Ratio: 20 (ref 9–23)
BUN: 17 mg/dL (ref 6–24)
Bilirubin Total: 0.4 mg/dL (ref 0.0–1.2)
CALCIUM: 9.9 mg/dL (ref 8.7–10.2)
CO2: 25 mmol/L (ref 20–29)
Chloride: 102 mmol/L (ref 96–106)
Creatinine, Ser: 0.87 mg/dL (ref 0.57–1.00)
GFR calc Af Amer: 94 mL/min/{1.73_m2} (ref >59)
GFR calc non Af Amer: 81 mL/min/{1.73_m2} (ref >59)
GLUCOSE: 117 mg/dL — AB (ref 65–99)
Globulin, Total: 2.6 g/dL (ref 1.5–4.5)
Potassium: 5 mmol/L (ref 3.5–5.2)
Sodium: 140 mmol/L (ref 134–144)
Total Protein: 7.1 g/dL (ref 6.0–8.5)

## 2019-01-11 LAB — CBC
HEMOGLOBIN: 13.2 g/dL (ref 11.1–15.9)
Hematocrit: 38.5 % (ref 34.0–46.6)
MCH: 31.4 pg (ref 26.6–33.0)
MCHC: 34.3 g/dL (ref 31.5–35.7)
MCV: 92 fL (ref 79–97)
Platelets: 286 10*3/uL (ref 150–450)
RBC: 4.2 x10E6/uL (ref 3.77–5.28)
RDW: 12.5 % (ref 11.7–15.4)
WBC: 9.3 10*3/uL (ref 3.4–10.8)

## 2019-01-13 ENCOUNTER — Telehealth: Payer: Self-pay

## 2019-01-13 NOTE — Telephone Encounter (Signed)
CMA spoke to patient to inform on results.  Pt. Verified DOB. Pt. Understood.  Spanish interpreter assist with the call.  

## 2019-01-13 NOTE — Telephone Encounter (Signed)
-----   Message from Claiborne Rigg, NP sent at 01/12/2019 10:45 PM EDT ----- Labs do not show anemia. Kidney and liver function normal.

## 2019-01-16 ENCOUNTER — Ambulatory Visit (HOSPITAL_COMMUNITY): Admission: RE | Admit: 2019-01-16 | Payer: Self-pay | Source: Ambulatory Visit

## 2019-01-19 ENCOUNTER — Ambulatory Visit (HOSPITAL_COMMUNITY)
Admission: RE | Admit: 2019-01-19 | Discharge: 2019-01-19 | Disposition: A | Discharge status: Home / Self Care | Payer: Self-pay | Source: Ambulatory Visit | Attending: Nurse Practitioner | Admitting: Nurse Practitioner

## 2019-01-19 ENCOUNTER — Other Ambulatory Visit: Payer: Self-pay

## 2019-01-19 DIAGNOSIS — M542 Cervicalgia: Secondary | ICD-10-CM | POA: Insufficient documentation

## 2019-01-19 DIAGNOSIS — G8929 Other chronic pain: Secondary | ICD-10-CM | POA: Insufficient documentation

## 2019-01-23 ENCOUNTER — Telehealth: Payer: Self-pay

## 2019-01-23 NOTE — Telephone Encounter (Signed)
CMA attempt to reach patient to inform on results.  No answer and left a VM.  

## 2019-01-23 NOTE — Telephone Encounter (Signed)
-----   Message from Claiborne Rigg, NP sent at 01/23/2019  9:27 AM EDT ----- CT of your neck does not show any fractures of disc compression or narrowing.

## 2019-01-24 ENCOUNTER — Ambulatory Visit: Payer: Self-pay | Admitting: Physical Therapy

## 2019-01-24 NOTE — Telephone Encounter (Signed)
Pt called back in regards to results please follow up  °

## 2019-01-27 NOTE — Telephone Encounter (Signed)
CMA spoke to patient to inform on CT scan results.  Pt. Verified DOB. Pt. Understood.  Spanish interpreter assisted with the call.

## 2019-03-29 ENCOUNTER — Encounter: Payer: Self-pay | Admitting: Physical Therapy

## 2019-03-29 ENCOUNTER — Ambulatory Visit: Payer: Self-pay | Attending: Nurse Practitioner | Admitting: Physical Therapy

## 2019-03-29 ENCOUNTER — Other Ambulatory Visit: Payer: Self-pay

## 2019-03-29 DIAGNOSIS — M25512 Pain in left shoulder: Secondary | ICD-10-CM | POA: Insufficient documentation

## 2019-03-29 DIAGNOSIS — M25511 Pain in right shoulder: Secondary | ICD-10-CM | POA: Insufficient documentation

## 2019-03-29 DIAGNOSIS — G8929 Other chronic pain: Secondary | ICD-10-CM | POA: Insufficient documentation

## 2019-03-29 DIAGNOSIS — M542 Cervicalgia: Secondary | ICD-10-CM | POA: Insufficient documentation

## 2019-03-29 NOTE — Therapy (Signed)
[facility] [address] [place], [place], [address] Phone: [phone]   Fax:  [phone]  Physical Therapy Evaluation  Patient Details  Name: Carrie Buchanan MRN: [id number] Date of Birth: 1976/07/26 Referring Provider (PT): Claiborne Rigg, NP   Encounter Date: 03/29/2019  PT End of Session - 03/29/19 0946    Visit Number  1    Number of Visits  13    Date for PT Re-Evaluation  05/12/19    Authorization Type  CAFA exp 6/30    PT Start Time  0903    PT Stop Time  0946    PT Time Calculation (min)  43 min    Activity Tolerance  Patient tolerated treatment well    Behavior During Therapy  [facility] for tasks assessed/performed       Past Medical History:  Diagnosis Date  . Appendicitis, acute, with peritonitis 02/20/2013  . Medical history non-contributory   . Prediabetes     Past Surgical History:  Procedure Laterality Date  . APPENDECTOMY    . INTRAUTERINE DEVICE INSERTION  09/2012  . LAPAROSCOPIC APPENDECTOMY N/A 02/09/2013   Procedure: APPENDECTOMY LAPAROSCOPIC;  Surgeon: Emelia Loron, MD;  Location: [facility];  Service: General;  Laterality: N/A;    There were no vitals filed for this visit.   Subjective Assessment - 03/29/19 0909    Subjective  Every day since accident, back/neck pain all the way from skull. Work accident 2 years ago- wearing a vacuum on my back, went into elevator- when the cable got caught and door closed it pulled me down when I bent over to pick something up. Some cracking sounds when I move my head. Moving often at night due to pain. Did PT for 3 months but reported it was not helpful.     Patient Stated Goals  house chores, work- [employer]    Currently in Pain?  Yes    Pain Score  7     Pain Location  Neck    Pain Orientation  Right;Left    Pain Descriptors / Indicators  --   cracking, pain   Aggravating Factors   reaching Right arm overhead    Pain Relieving Factors  exercises  momentarily, medications         [facility] PT Assessment - 03/29/19 0001      Assessment   Medical Diagnosis  chronic neck pain    Referring Provider (PT)  Claiborne Rigg, NP    Onset Date/Surgical Date  --   2 years   Hand Dominance  Left    Prior Therapy  yes 3 months      Precautions   Precautions  None      Restrictions   Weight Bearing Restrictions  No      Balance Screen   Has the patient fallen in the past 6 months  No      Home Environment   Living Environment  Private residence    Living Arrangements  Spouse/significant other;Children      Prior Function   Level of Independence  Independent    Vocation Requirements  cleaning offices      Cognition   Overall Cognitive Status  Within Functional Limits for tasks assessed      Sensation   Additional Comments  [facility]      Posture/Postural Control   Posture Comments  rounded shoulders, decreased thoracic curve      ROM / Strength   AROM / PROM /  Strength  AROM;Strength;PROM      AROM   AROM Assessment Site  Cervical;Shoulder    Right/Left Shoulder  Right;Left    Right Shoulder Flexion  136 Degrees    Right Shoulder ABduction  128 Degrees    Left Shoulder Flexion  138 Degrees    Left Shoulder ABduction  124 Degrees    Cervical Flexion  40    Cervical - Right Side Bend  22    Cervical - Left Side Bend  14    Cervical - Right Rotation  64    Cervical - Left Rotation  56      PROM   Overall PROM Comments  [facility] PROM [facility]      Strength   Strength Assessment Site  Shoulder    Right/Left Shoulder  Right;Left    Right Shoulder Flexion  3-/5    Right Shoulder ABduction  3-/5    Right Shoulder Internal Rotation  4+/5    Right Shoulder External Rotation  4+/5    Left Shoulder Flexion  3-/5    Left Shoulder ABduction  3-/5    Left Shoulder Internal Rotation  4+/5    Left Shoulder External Rotation  4+/5      Palpation   Palpation comment  TTP midline cervical spine at lower levels, suboccipitals, TTP bil AC  joints                Objective measurements completed on examination: See above findings.      [facility] Adult PT Treatment/Exercise - 03/29/19 0001      Exercises   Exercises  Shoulder      Shoulder Exercises: Supine   Other Supine Exercises  supine chin tuck      Shoulder Exercises: Stretch   Other Shoulder Stretches  door chest stretch      Manual Therapy   Manual Therapy  Soft tissue mobilization;Manual Traction    Soft tissue mobilization  suboccipitals    Manual Traction  cervical             PT Education - 03/29/19 1629    Education Details  anatomy of condition, POC, HEP, exercise form/rationale    Person(s) Educated  Patient    Methods  Explanation;Demonstration;Tactile cues;Verbal cues    Comprehension  Verbalized understanding;Need further instruction;Returned demonstration;Verbal cues required;Tactile cues required       PT Short Term Goals - 03/29/19 1632      PT SHORT TERM GOAL #1   Title  Pt will demonstrate proper activation of periscapular region for appropriate resting posture    Baseline  requires further education    Time  3    Period  Weeks    Status  New    Target Date  04/19/19        PT Long Term Goals - 03/29/19 1633      PT LONG TERM GOAL #1   Title  Pt will be able to sleep comfortably    Baseline  unable at eval    Time  6    Period  Weeks    Status  New    Target Date  05/12/19      PT LONG TERM GOAL #2   Title  Pt will be able to reach into high cabinets without limitation by pain    Baseline  unable at eval    Time  6    Period  Weeks    Status  New    Target Date  05/12/19      PT LONG TERM GOAL #3   Title  Pt will be able to complete household chores    Baseline  unable to complete due to pain at eval    Time  6    Period  Weeks    Status  New    Target Date  05/12/19      PT LONG TERM GOAL #4   Title  Pt will be able to work pain <=3/10    Baseline  severe and limiting at eval    Time  6     Period  Weeks    Status  New    Target Date  05/12/19             Plan - 03/29/19 1346    Clinical Impression Statement  Pt presents to PT with complaints of neck and bilateral shoulder pain, with R worse than L, after a fall 2 years ago. strength and ROM testing limited by pain and concordant TTP at Rt suboccipitals and at paraspinals of lower cervical spine. Reported improved movement after suboccipital release. Will benefit from skilled PT to reduce spasm and improve ROM to meet LTGs.     Personal Factors and Comorbidities  Education;Profession    Examination-Activity Limitations  Hygiene/Grooming;Stand;Carry;Dressing;Sleep    Examination-Participation Restrictions  Cleaning;Meal Prep    Stability/Clinical Decision Making  Stable/Uncomplicated    Clinical Decision Making  Low    Rehab Potential  Good    PT Frequency  2x / week    PT Duration  6 weeks    PT Treatment/Interventions  ADLs/Self Care Home Management;Cryotherapy;Traction;Moist Heat;Iontophoresis /ml Dexamethasone;Electrical Stimulation;Functional mobility training;Neuromuscular re-education;Therapeutic exercise;Therapeutic activities;Patient/family education;Manual techniques;Dry needling;Passive range of motion;Taping;Joint Manipulations;Spinal Manipulations    PT Next Visit Plan  STM to cervical region, thoracic mobs, periscap strengthening     *use interpreter cart    PT Home Exercise Plan  chin tuck, door pec stretch    Consulted and Agree with Plan of Care  Patient       Patient will benefit from skilled therapeutic intervention in order to improve the following deficits and impairments:  Improper body mechanics, Pain, Postural dysfunction, Increased muscle spasms, Decreased activity tolerance, Decreased endurance, Decreased range of motion, Decreased strength, Impaired flexibility  Visit Diagnosis: Cervicalgia - Plan: PT plan of care cert/re-cert  Chronic right shoulder pain - Plan: PT plan of care  cert/re-cert  Chronic left shoulder pain - Plan: PT plan of care cert/re-cert     Problem List Patient Active Problem List   Diagnosis Date Noted  . IFG (impaired fasting glucose) 07/11/2014  . High triglycerides 07/11/2014  . Appendicitis, acute, with peritonitis 02/20/2013  . Twin pregnancy, delivered vaginally, current hospitalization 04/21/2012  . Multiparity 02/18/2012  . Short cervix, antepartum, twins 01/14/2012  . Twin pregnancy, antepartum 01/01/2012  . AMA (advanced maternal age) multigravida 35+ 01/01/2012  . Late prenatal care 01/01/2012  . High-risk pregnancy supervision 01/01/2012   Gwenlyn Found. Hiroto Saltzman PT, DPT 03/29/19 4:41 PM   [facility] [address] [place], [place], [address] Phone: [phone]   Fax:  [phone]  Name: Carrie Buchanan MRN: [id number] Date of Birth: 1976-07-26

## 2019-04-03 ENCOUNTER — Encounter: Payer: Self-pay | Admitting: Physical Therapy

## 2019-04-03 ENCOUNTER — Other Ambulatory Visit: Payer: Self-pay

## 2019-04-03 ENCOUNTER — Ambulatory Visit: Payer: Self-pay | Attending: Nurse Practitioner | Admitting: Physical Therapy

## 2019-04-03 DIAGNOSIS — M25511 Pain in right shoulder: Secondary | ICD-10-CM | POA: Insufficient documentation

## 2019-04-03 DIAGNOSIS — M25512 Pain in left shoulder: Secondary | ICD-10-CM | POA: Insufficient documentation

## 2019-04-03 DIAGNOSIS — M542 Cervicalgia: Secondary | ICD-10-CM | POA: Insufficient documentation

## 2019-04-03 DIAGNOSIS — G8929 Other chronic pain: Secondary | ICD-10-CM | POA: Insufficient documentation

## 2019-04-03 NOTE — Therapy (Signed)
[facility] [address] [place], [place], [address] Phone: [phone]   Fax:  [phone]  Physical Therapy Treatment  Patient Details  Name: Carrie Buchanan MRN: [id number] Date of Birth: 10/26/1976 Referring Provider (PT): Claiborne Rigg, NP   Encounter Date: 04/03/2019  PT End of Session - 04/03/19 0933    Visit Number  2    Number of Visits  13    Date for PT Re-Evaluation  05/12/19    Authorization Type  CAFA exp 6/30    PT Start Time  0933    PT Stop Time  1013    PT Time Calculation (min)  40 min    Activity Tolerance  Patient tolerated treatment well    Behavior During Therapy  [facility] for tasks assessed/performed       Past Medical History:  Diagnosis Date  . Appendicitis, acute, with peritonitis 02/20/2013  . Medical history non-contributory   . Prediabetes     Past Surgical History:  Procedure Laterality Date  . APPENDECTOMY    . INTRAUTERINE DEVICE INSERTION  09/2012  . LAPAROSCOPIC APPENDECTOMY N/A 02/09/2013   Procedure: APPENDECTOMY LAPAROSCOPIC;  Surgeon: Emelia Loron, MD;  Location: [facility] OR;  Service: General;  Laterality: N/A;    There were no vitals filed for this visit.  Subjective Assessment - 04/03/19 0934    Subjective  I was more sore from neck and exercises- I could not do the exercises. When I do the exercises, it feels like the nerves and brain are pulling. The door stretch hurts the back and waist. Difficulty sleeping.     Patient Stated Goals  house chores, work- [employer]    Currently in Pain?  Yes    Pain Score  --   unable to describe   Pain Location  Neck    Pain Orientation  Mid    Pain Descriptors / Indicators  --   pain                      [facility] Adult PT Treatment/Exercise - 04/03/19 0001      Shoulder Exercises: Supine   Flexion  10 reps    Flexion Limitations  shoulder flexion holding wand    Other Supine Exercises  supine scapular  retraction      Shoulder Exercises: Stretch   Other Shoulder Stretches  supine pec stretch      Manual Therapy   Manual therapy comments  edu on self rolling to wrist extensor group    Soft tissue mobilization  suboccipitals, cervical paraspinals, bil upper traps    Manual Traction  gentle cervical               PT Short Term Goals - 03/29/19 1632      PT SHORT TERM GOAL #1   Title  Pt will demonstrate proper activation of periscapular region for appropriate resting posture    Baseline  requires further education    Time  3    Period  Weeks    Status  New    Target Date  04/19/19        PT Long Term Goals - 03/29/19 1633      PT LONG TERM GOAL #1   Title  Pt will be able to sleep comfortably    Baseline  unable at eval    Time  6    Period  Weeks    Status  New    Target  Date  05/12/19      PT LONG TERM GOAL #2   Title  Pt will be able to reach into high cabinets without limitation by pain    Baseline  unable at eval    Time  6    Period  Weeks    Status  New    Target Date  05/12/19      PT LONG TERM GOAL #3   Title  Pt will be able to complete household chores    Baseline  unable to complete due to pain at eval    Time  6    Period  Weeks    Status  New    Target Date  05/12/19      PT LONG TERM GOAL #4   Title  Pt will be able to work pain <=3/10    Baseline  severe and limiting at eval    Time  6    Period  Weeks    Status  New    Target Date  05/12/19            Plan - 04/03/19 1018    Clinical Impression Statement  Pt with trigger points in upper traps and paraspinals on Rt side, most notable at C4. Reported soreness increase from last visit and was educated on rationale for soreness and soreness v pain. Altered exercise program to improve tolerance. Reported decreased pain following treatment and enjoyed using roller on arm.     PT Treatment/Interventions  ADLs/Self Care Home Management;Cryotherapy;Traction;Moist Heat;Iontophoresis  4mg /ml Dexamethasone;Electrical Stimulation;Functional mobility training;Neuromuscular re-education;Therapeutic exercise;Therapeutic activities;Patient/family education;Manual techniques;Dry needling;Passive range of motion;Taping;Joint Manipulations;Spinal Manipulations    PT Next Visit Plan  re-evaluate HEP & alter/progress PRN. prone joint mobs    PT Home Exercise Plan  supine pec stretch, supine shoulder flexion, supine scap retraction    Consulted and Agree with Plan of Care  Patient       Patient will benefit from skilled therapeutic intervention in order to improve the following deficits and impairments:  Improper body mechanics, Pain, Postural dysfunction, Increased muscle spasms, Decreased activity tolerance, Decreased endurance, Decreased range of motion, Decreased strength, Impaired flexibility  Visit Diagnosis: Cervicalgia  Chronic right shoulder pain  Chronic left shoulder pain     Problem List Patient Active Problem List   Diagnosis Date Noted  . IFG (impaired fasting glucose) 07/11/2014  . High triglycerides 07/11/2014  . Appendicitis, acute, with peritonitis 02/20/2013  . Twin pregnancy, delivered vaginally, current hospitalization 04/21/2012  . Multiparity 02/18/2012  . Short cervix, antepartum, twins 01/14/2012  . Twin pregnancy, antepartum 01/01/2012  . AMA (advanced maternal age) multigravida 35+ 01/01/2012  . Late prenatal care 01/01/2012  . High-risk pregnancy supervision 01/01/2012    Gwenlyn FoundJessica C. Jedi Catalfamo PT, DPT 04/03/19 10:22 AM   [facility] [address] [place], [name], [address] Phone: [phone]   Fax:  [phone]  Name: Carrie Buchanan MRN: [id number] Date of Birth: 03/28/1976

## 2019-04-05 ENCOUNTER — Encounter: Payer: Self-pay | Admitting: Physical Therapy

## 2019-04-05 ENCOUNTER — Other Ambulatory Visit: Payer: Self-pay

## 2019-04-05 ENCOUNTER — Ambulatory Visit: Payer: Self-pay | Admitting: Physical Therapy

## 2019-04-05 DIAGNOSIS — M542 Cervicalgia: Secondary | ICD-10-CM

## 2019-04-05 DIAGNOSIS — G8929 Other chronic pain: Secondary | ICD-10-CM

## 2019-04-05 DIAGNOSIS — M25512 Pain in left shoulder: Secondary | ICD-10-CM

## 2019-04-05 NOTE — Therapy (Signed)
[name], [name], [address] Phone: [phone]   Fax:  [phone]  Physical Therapy Treatment  Patient Details  Name: Carrie Buchanan MRN: [id number] Date of Birth: 03/30/1976 Referring Provider (PT): Claiborne RiggFleming, [name], NP   Encounter Date: 04/05/2019  PT End of Session - 04/05/19 0905    Visit Number  3    Number of Visits  13    Date for PT Re-Evaluation  05/12/19    Authorization Type  CAFA exp 6/30    PT Start Time  0903    PT Stop Time  0947    PT Time Calculation (min)  44 min    Activity Tolerance  Patient tolerated treatment well    Behavior During Therapy  [facility] for tasks assessed/performed       Past Medical History:  Diagnosis Date  . Appendicitis, acute, with peritonitis 02/20/2013  . Medical history non-contributory   . Prediabetes     Past Surgical History:  Procedure Laterality Date  . APPENDECTOMY    . INTRAUTERINE DEVICE INSERTION  09/2012  . LAPAROSCOPIC APPENDECTOMY N/A 02/09/2013   Procedure: APPENDECTOMY LAPAROSCOPIC;  Surgeon: Emelia LoronMatthew Wakefield, MD;  [facility];  Service: General;  Laterality: N/A;    There were no vitals filed for this visit.  Subjective Assessment - 04/05/19 0905    Subjective  When I woke up it was sore, but I have felt some relief. 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  Yes    Pain Score  6     Pain Location  Neck    Pain Orientation  Anterior    Pain Descriptors / Indicators  Aching    Aggravating Factors   a little with arms overhead, more with reaching behind beck, pain reaching behind back    Pain Relieving Factors  exercises         [facility] PT Assessment - 04/05/19 0001      PROM   Overall PROM Comments  cervical ROM WFL in supine and equal bilaterally with pulling noted in Lt upper trap                    [facility] Adult PT Treatment/Exercise - 04/05/19 0001      Shoulder Exercises: Supine   Horizontal ABduction  10 reps    Theraband Level (Shoulder Horizontal ABduction)  Level 1 (Yellow)    Flexion  10 reps    Flexion Limitations  holding pillow case      Shoulder Exercises: Seated   External Rotation  10 reps    Theraband Level (Shoulder External Rotation)  Level 1 (Yellow)      Shoulder Exercises: Stretch   Table Stretch - ABduction Limitations  open books    Other Shoulder Stretches  supine pec stretch      Manual Therapy   Manual Therapy  Passive ROM    Soft tissue mobilization  suboccipitals, bil upper traps    Passive ROM  cervical rotation, SB, flx with end range stretching    Manual Traction  gentle cervical (cues for belly breathing)             PT Education - 04/05/19 0947    Education Details  role of posture & anatomy of condition    Person(s) Educated  Patient  Methods  Explanation;Demonstration;Tactile cues;Verbal cues;Handout    Comprehension  Tactile cues required;Verbal cues required;Returned demonstration;Verbalized understanding;Need further instruction       PT Short Term Goals - 03/29/19 1632      PT SHORT TERM GOAL #1   Title  Pt will demonstrate proper activation of periscapular region for appropriate resting posture    Baseline  requires further education    Time  3    Period  Weeks    Status  New    Target Date  04/19/19        PT Long Term Goals - 03/29/19 1633      PT LONG TERM GOAL #1   Title  Pt will be able to sleep comfortably    Baseline  unable at eval    Time  6    Period  Weeks    Status  New    Target Date  05/12/19      PT LONG TERM GOAL #2   Title  Pt will be able to reach into high cabinets without limitation by pain    Baseline  unable at eval    Time  6    Period  Weeks    Status  New    Target Date  05/12/19      PT LONG TERM GOAL #3   Title  Pt will be able to complete household  chores    Baseline  unable to complete due to pain at eval    Time  6    Period  Weeks    Status  New    Target Date  05/12/19      PT LONG TERM GOAL #4   Title  Pt will be able to work pain <=3/10    Baseline  severe and limiting at eval    Time  6    Period  Weeks    Status  New    Target Date  05/12/19            Plan - 04/05/19 0947    Clinical Impression Statement  Decreased tightness with increased anterior soreness due to increased movement. Pt is now able to reach into full GHJ flx with minimal discomfort but still has pain with reaching behind head, low back and with cervical rotation. Pt verbalized understanding of neck pain after feeling the difference in good vs bad posture when rotating. Began adding some resistance exercises to periscapular region. Provided wrist ext stretch due to tightness and discomfort at medial elbow.     PT Treatment/Interventions  ADLs/Self Care Home Management;Cryotherapy;Traction;Moist Heat;Iontophoresis 4mg /ml Dexamethasone;Electrical Stimulation;Functional mobility training;Neuromuscular re-education;Therapeutic exercise;Therapeutic activities;Patient/family education;Manual techniques;Dry needling;Passive range of motion;Taping;Joint Manipulations;Spinal Manipulations    PT Next Visit Plan  continue STM, sidelying ER, rows    PT Home Exercise Plan  supine pec stretch, supine shoulder flexion, supine scap retraction; supine horiz abd, seated ER, open book, wrist extension stretch    Consulted and Agree with Plan of Care  Patient       Patient will benefit from skilled therapeutic intervention in order to improve the following deficits and impairments:  Improper body mechanics, Pain, Postural dysfunction, Increased muscle spasms, Decreased activity tolerance, Decreased endurance, Decreased range of motion, Decreased strength, Impaired flexibility  Visit Diagnosis: Cervicalgia  Chronic right shoulder pain  Chronic left shoulder  pain     Problem List Patient Active Problem List   Diagnosis Date Noted  . IFG (impaired fasting glucose) 07/11/2014  . High triglycerides 07/11/2014  .  Appendicitis, acute, with peritonitis 02/20/2013  . Twin pregnancy, delivered vaginally, current hospitalization 04/21/2012  . Multiparity 02/18/2012  . Short cervix, antepartum, twins 01/14/2012  . Twin pregnancy, antepartum 01/01/2012  . AMA (advanced maternal age) multigravida 35+ 01/01/2012  . Late prenatal care 01/01/2012  . High-risk pregnancy supervision 01/01/2012    Gwenlyn Found. Lesley Atkin PT, DPT 04/05/19 9:52 AM   [facility] [address] [place], [place], [address] Phone: [phone]   Fax:  [phone]  Name: Heba Ige MRN: [id number] Date of Birth: [birth year]

## 2019-04-10 ENCOUNTER — Other Ambulatory Visit: Payer: Self-pay

## 2019-04-10 ENCOUNTER — Ambulatory Visit: Payer: Self-pay | Admitting: Physical Therapy

## 2019-04-10 ENCOUNTER — Encounter: Payer: Self-pay | Admitting: Physical Therapy

## 2019-04-10 DIAGNOSIS — G8929 Other chronic pain: Secondary | ICD-10-CM

## 2019-04-10 DIAGNOSIS — M25511 Pain in right shoulder: Secondary | ICD-10-CM

## 2019-04-10 DIAGNOSIS — M25512 Pain in left shoulder: Secondary | ICD-10-CM

## 2019-04-10 DIAGNOSIS — M542 Cervicalgia: Secondary | ICD-10-CM

## 2019-04-10 NOTE — Therapy (Signed)
[facility] [address] [place], [name], [address] Phone: [phone]   Fax:  [phone]  Physical Therapy Treatment  Patient Details  Name: Exie ParodyRosa Aguilar Ramirez MRN: [id number] Date of Birth: 10/09/1976 Referring Provider (PT): Claiborne RiggFleming, [name], NP   Encounter Date: 04/10/2019  PT End of Session - 04/10/19 1144    Visit Number  4    Number of Visits  13    Date for PT Re-Evaluation  05/12/19    Authorization Type  CAFA exp 6/30    PT Start Time  1100    PT Stop Time  1144    PT Time Calculation (min)  44 min    Activity Tolerance  Patient tolerated treatment well    Behavior During Therapy  [facility] tasks assessed/performed       Past Medical History:  Diagnosis Date  . Appendicitis, acute, with peritonitis 02/20/2013  . Medical history non-contributory   . Prediabetes     Past Surgical History:  Procedure Laterality Date  . APPENDECTOMY    . INTRAUTERINE DEVICE INSERTION  09/2012  . LAPAROSCOPIC APPENDECTOMY N/A 02/09/2013   Procedure: APPENDECTOMY LAPAROSCOPIC;  Surgeon: Emelia LoronMatthew Wakefield, MD;  [facility];  Service: General;  Laterality: N/A;    There were no vitals filed for this visit.  Subjective Assessment - 04/10/19 1104    Subjective  My neck feels a little better, still having pain radiaiting to my Rt arm. bilateral pectoralis pain.     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  Yes    Pain Score  4     Pain Location  Shoulder    Pain Orientation  Right    Pain Descriptors / Indicators  Sharp;Stabbing    Aggravating Factors   resting pain    Pain Relieving Factors  stretches                       [facility] Adult PT Treatment/Exercise - 04/10/19 0001      Shoulder Exercises: Seated   External Rotation Limitations  AROM paired with scap retraction    Other Seated Exercises  scap retraction      Shoulder Exercises: Stretch   Table Stretch  -Flexion Limitations  supine flexion with wand    Other Shoulder Stretches  seated upper trap & levator stretch      Manual Therapy   Manual Therapy  Joint mobilization    Manual therapy comments  edu on self STM to pectoralis with tennis ball    Joint Mobilization  prone rib ER mobs & mobs with breathing    Soft tissue mobilization  bilateral upper traps, thoracic paraspinals    Passive ROM  cervical sidebend, scapular mobility               PT Short Term Goals - 03/29/19 1632      PT SHORT TERM GOAL #1   Title  Pt will demonstrate proper activation of periscapular region for appropriate resting posture    Baseline  requires further education    Time  3    Period  Weeks    Status  New    Target Date  04/19/19        PT Long Term Goals - 03/29/19 1633      PT LONG TERM GOAL #1   Title  Pt will be able to sleep comfortably    Baseline  unable  at eval    Time  6    Period  Weeks    Status  New    Target Date  05/12/19      PT LONG TERM GOAL #2   Title  Pt will be able to reach into high cabinets without limitation by pain    Baseline  unable at eval    Time  6    Period  Weeks    Status  New    Target Date  05/12/19      PT LONG TERM GOAL #3   Title  Pt will be able to complete household chores    Baseline  unable to complete due to pain at eval    Time  6    Period  Weeks    Status  New    Target Date  05/12/19      PT LONG TERM GOAL #4   Title  Pt will be able to work pain <=3/10    Baseline  severe and limiting at eval    Time  6    Period  Weeks    Status  New    Target Date  05/12/19            Plan - 04/10/19 1150    Clinical Impression Statement  Pt reports she is noticing and improvement in occipital pain but has popping in her neck with cervical rotation. Pt is very tight through rib cage and associated musculature. Increased time required for translation services.     PT Treatment/Interventions  ADLs/Self Care Home  Management;Cryotherapy;Traction;Moist Heat;Iontophoresis 4mg /ml Dexamethasone;Electrical Stimulation;Functional mobility training;Neuromuscular re-education;Therapeutic exercise;Therapeutic activities;Patient/family education;Manual techniques;Dry needling;Passive range of motion;Taping;Joint Manipulations;Spinal Manipulations    PT Next Visit Plan  continue STM, rows & lat pull downs    PT Home Exercise Plan  supine pec stretch, supine shoulder flexion, supine scap retraction; supine horiz abd, seated ER, open book, wrist extension stretch, levator & upper trap stretch    Consulted and Agree with Plan of Care  Patient       Patient will benefit from skilled therapeutic intervention in order to improve the following deficits and impairments:  Improper body mechanics, Pain, Postural dysfunction, Increased muscle spasms, Decreased activity tolerance, Decreased endurance, Decreased range of motion, Decreased strength, Impaired flexibility  Visit Diagnosis: Cervicalgia  Chronic right shoulder pain  Chronic left shoulder pain     Problem List Patient Active Problem List   Diagnosis Date Noted  . IFG (impaired fasting glucose) 07/11/2014  . High triglycerides 07/11/2014  . Appendicitis, acute, with peritonitis 02/20/2013  . Twin pregnancy, delivered vaginally, current hospitalization 04/21/2012  . Multiparity 02/18/2012  . Short cervix, antepartum, twins 01/14/2012  . Twin pregnancy, antepartum 01/01/2012  . AMA (advanced maternal age) multigravida 35+ 01/01/2012  . Late prenatal care 01/01/2012  . High-risk pregnancy supervision 01/01/2012    Gay Filler. Kashia Brossard PT, DPT 04/10/19 12:21 PM   [facility] [address] [place], [place], [address] Phone: [phone]   Fax:  [phone]  Name: Zailee Vallely MRN: [id number] Date of Birth: 24-May-1976

## 2019-04-12 ENCOUNTER — Ambulatory Visit: Payer: Self-pay | Admitting: Physical Therapy

## 2019-04-12 ENCOUNTER — Other Ambulatory Visit: Payer: Self-pay

## 2019-04-12 ENCOUNTER — Encounter: Payer: Self-pay | Admitting: Physical Therapy

## 2019-04-12 DIAGNOSIS — M542 Cervicalgia: Secondary | ICD-10-CM

## 2019-04-12 DIAGNOSIS — M25511 Pain in right shoulder: Secondary | ICD-10-CM

## 2019-04-12 DIAGNOSIS — G8929 Other chronic pain: Secondary | ICD-10-CM

## 2019-04-12 DIAGNOSIS — M25512 Pain in left shoulder: Secondary | ICD-10-CM

## 2019-04-12 NOTE — Therapy (Signed)
[facility] [address] [place], [name], [address] Phone: [phone]   Fax:  [phone]  Physical Therapy Treatment  Patient Details  Name: Carrie ParodyRosa Aguilar Buchanan MRN: [id number] Date of Birth: 02/18/1976 Referring Provider (PT): Claiborne RiggFleming, [name], NP   Encounter Date: 04/12/2019  PT End of Session - 04/12/19 1101    Visit Number  5    Number of Visits  13    Date for PT Re-Evaluation  05/12/19    Authorization Type  CAFA exp 6/30    PT Start Time  1100    PT Stop Time  1200    PT Time Calculation (min)  60 min    Activity Tolerance  Patient tolerated treatment well    Behavior During Therapy  [facility] for tasks assessed/performed       Past Medical History:  Diagnosis Date  . Appendicitis, acute, with peritonitis 02/20/2013  . Medical history non-contributory   . Prediabetes     Past Surgical History:  Procedure Laterality Date  . APPENDECTOMY    . INTRAUTERINE DEVICE INSERTION  09/2012  . LAPAROSCOPIC APPENDECTOMY N/A 02/09/2013   Procedure: APPENDECTOMY LAPAROSCOPIC;  Surgeon: Emelia LoronMatthew Wakefield, MD;  [facility];  Service: General;  Laterality: N/A;    There were no vitals filed for this visit.  Subjective Assessment - 04/12/19 1101    Subjective  I got up today with ache, I felt sore. I can't sleep at night because of pain in shoulder and back. Sleeps supine. Feels better when she sleeps on couch or in kids bed. Left hand feels heavy. Denies HA.     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  Yes    Pain Score  8     Pain Location  Back    Pain Orientation  Upper    Pain Descriptors / Indicators  Stabbing;Pressure                       [facility] Adult PT Treatment/Exercise - 04/12/19 0001      Modalities   Modalities  Traction;Electrical Stimulation;Moist Heat      Moist Heat Therapy   Number Minutes Moist Heat  15 Minutes    Moist Heat Location  Cervical    with ESTIM     Electrical Stimulation   Electrical Stimulation Location  cervical-thoracic    Electrical Stimulation Action  IFC    Electrical Stimulation Parameters  15 min with heat    Electrical Stimulation Goals  Pain      Traction   Type of Traction  Cervical    Min (lbs)  5    Max (lbs)  10    Hold Time  60    Rest Time  20    Time  15 min preset             PT Education - 04/12/19 1117    Education Details  sleeping posture & mattress, traction & rationale, discussion of pain and progress/changes, increased time for interpretation and calling the service    Person(s) Educated  Patient    Methods  Explanation    Comprehension  Verbalized understanding;Need further instruction       PT Short Term Goals - 04/12/19 1120      PT SHORT TERM GOAL #1   Title  Pt will demonstrate proper activation of periscapular region for appropriate resting posture    Baseline  able  to demo proper posture & activation but does not maintain the position due to fatigue and discomfort    Status  Achieved        PT Long Term Goals - 03/29/19 1633      PT LONG TERM GOAL #1   Title  Pt will be able to sleep comfortably    Baseline  unable at eval    Time  6    Period  Weeks    Status  New    Target Date  05/12/19      PT LONG TERM GOAL #2   Title  Pt will be able to reach into high cabinets without limitation by pain    Baseline  unable at eval    Time  6    Period  Weeks    Status  New    Target Date  05/12/19      PT LONG TERM GOAL #3   Title  Pt will be able to complete household chores    Baseline  unable to complete due to pain at eval    Time  6    Period  Weeks    Status  New    Target Date  05/12/19      PT LONG TERM GOAL #4   Title  Pt will be able to work pain <=3/10    Baseline  severe and limiting at eval    Time  6    Period  Weeks    Status  New    Target Date  05/12/19            Plan - 04/12/19 1119    Clinical Impression Statement   Overall pt reports that the pain in the base of her skull is decreasing but still feels pain in "neck & upper back into arms." Attempted use of mechanical traction today to address lack of change in this area.     PT Treatment/Interventions  ADLs/Self Care Home Management;Cryotherapy;Traction;Moist Heat;Iontophoresis 4mg /ml Dexamethasone;Electrical Stimulation;Functional mobility training;Neuromuscular re-education;Therapeutic exercise;Therapeutic activities;Patient/family education;Manual techniques;Dry needling;Passive range of motion;Taping;Joint Manipulations;Spinal Manipulations    PT Next Visit Plan  outcome of traction & estim?    PT Home Exercise Plan  supine pec stretch, supine shoulder flexion, supine scap retraction; supine horiz abd, seated ER, open book, wrist extension stretch, levator & upper trap stretch    Consulted and Agree with Plan of Care  Patient       Patient will benefit from skilled therapeutic intervention in order to improve the following deficits and impairments:  Improper body mechanics, Pain, Postural dysfunction, Increased muscle spasms, Decreased activity tolerance, Decreased endurance, Decreased range of motion, Decreased strength, Impaired flexibility  Visit Diagnosis: Cervicalgia  Chronic right shoulder pain  Chronic left shoulder pain     Problem List Patient Active Problem List   Diagnosis Date Noted  . IFG (impaired fasting glucose) 07/11/2014  . High triglycerides 07/11/2014  . Appendicitis, acute, with peritonitis 02/20/2013  . Twin pregnancy, delivered vaginally, current hospitalization 04/21/2012  . Multiparity 02/18/2012  . Short cervix, antepartum, twins 01/14/2012  . Twin pregnancy, antepartum 01/01/2012  . AMA (advanced maternal age) multigravida 35+ 01/01/2012  . Late prenatal care 01/01/2012  . High-risk pregnancy supervision 01/01/2012    Gay Filler. Elynor Kallenberger PT, DPT 04/12/19 12:09 PM   [facility] [address] [place], [place], [address] Phone: [phone]   Fax:  [phone]  Name: Romey Cohea MRN: [id number] Date of Birth: 1976/05/09

## 2019-04-17 ENCOUNTER — Encounter: Payer: Self-pay | Admitting: Physical Therapy

## 2019-04-17 ENCOUNTER — Ambulatory Visit: Payer: Self-pay | Admitting: Physical Therapy

## 2019-04-17 ENCOUNTER — Other Ambulatory Visit: Payer: Self-pay

## 2019-04-17 DIAGNOSIS — M542 Cervicalgia: Secondary | ICD-10-CM

## 2019-04-17 DIAGNOSIS — M25512 Pain in left shoulder: Secondary | ICD-10-CM

## 2019-04-17 DIAGNOSIS — G8929 Other chronic pain: Secondary | ICD-10-CM

## 2019-04-17 NOTE — Therapy (Signed)
[place], [place], [address] Phone: [phone]   Fax:  [phone]  Physical Therapy Treatment  Patient Details  Name: Carrie Buchanan MRN: [id number] Date of Birth: 18-Jul-1976 Referring Provider (PT): Gildardo Pounds, NP   Encounter Date: 04/17/2019  PT End of Session - 04/17/19 0833    Visit Number  6    Number of Visits  13    Date for PT Re-Evaluation  05/12/19    Authorization Type  CAFA exp 6/30    PT Start Time  0833    PT Stop Time  0929    PT Time Calculation (min)  56 min    Activity Tolerance  Patient tolerated treatment well    Behavior During Therapy  [facility] for tasks assessed/performed       Past Medical History:  Diagnosis Date  . Appendicitis, acute, with peritonitis 02/20/2013  . Medical history non-contributory   . Prediabetes     Past Surgical History:  Procedure Laterality Date  . APPENDECTOMY    . INTRAUTERINE DEVICE INSERTION  09/2012  . LAPAROSCOPIC APPENDECTOMY N/A 02/09/2013   Procedure: APPENDECTOMY LAPAROSCOPIC;  Surgeon: Rolm Bookbinder, MD;  Location: [place];  Service: General;  Laterality: N/A;    There were no vitals filed for this visit.  Subjective Assessment - 04/17/19 0835    Subjective  Neck pain is a little better. 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  Yes    Pain Score  4     Pain Location  Neck    Pain Orientation  Right;Left    Pain Descriptors / Indicators  Tightness    Aggravating Factors   unknown    Pain Relieving Factors  traction         [facility] PT Assessment - 04/17/19 0001      AROM   Right Shoulder Flexion  145 Degrees    Right Shoulder ABduction  114 Degrees    Left Shoulder Flexion  148 Degrees    Left Shoulder ABduction  128 Degrees    Cervical - Right Side Bend  24    Cervical -  Left Side Bend  26    Cervical - Right Rotation  66    Cervical - Left Rotation  62                   [facility] Adult PT Treatment/Exercise - 04/17/19 0001      Shoulder Exercises: Stretch   Table Stretch - External Rotation Limitations  supine chest stretch      Traction   Type of Traction  Cervical    Min (lbs)  5    Max (lbs)  10    Hold Time  60    Rest Time  20    Time  15 min      Manual Therapy   Manual Therapy  Joint mobilization    Manual therapy comments  skilled palpation and monitoring during TPDN    Joint Mobilization  bilateral first ribs, AP Rt GHJ    Soft tissue mobilization  bilat upper traps, Rt pectoralis       Trigger Point Dry Needling - 04/17/19 0001    Consent Given?  Yes    Education Handout Provided  --   verbal education  Muscles Treated Head and Neck  Upper trapezius    Upper Trapezius Response  Twitch reponse elicited;Palpable increased muscle length   bilatera          PT Education - 04/17/19 0912    Education Details  TPDN & expected outcomes, progress with ROM and associated pain- increased time required for interpreter    Person(s) Educated  Patient    Methods  Explanation;Verbal cues    Comprehension  Verbalized understanding;Need further instruction       PT Short Term Goals - 04/12/19 1120      PT SHORT TERM GOAL #1   Title  Pt will demonstrate proper activation of periscapular region for appropriate resting posture    Baseline  able to demo proper posture & activation but does not maintain the position due to fatigue and discomfort    Status  Achieved        PT Long Term Goals - 03/29/19 1633      PT LONG TERM GOAL #1   Title  Pt will be able to sleep comfortably    Baseline  unable at eval    Time  6    Period  Weeks    Status  New    Target Date  05/12/19      PT LONG TERM GOAL #2   Title  Pt will be able to reach into high cabinets without limitation by pain    Baseline  unable at eval    Time  6     Period  Weeks    Status  New    Target Date  05/12/19      PT LONG TERM GOAL #3   Title  Pt will be able to complete household chores    Baseline  unable to complete due to pain at eval    Time  6    Period  Weeks    Status  New    Target Date  05/12/19      PT LONG TERM GOAL #4   Title  Pt will be able to work pain <=3/10    Baseline  severe and limiting at eval    Time  6    Period  Weeks    Status  New    Target Date  05/12/19            Plan - 04/17/19 0913    Clinical Impression Statement  Pt demo improvements in ROM in shoulders as well as cervical ROM improving equality Rt to Lt and we discussed how pain can feel as though it increases as motion increases. Pt is unable to say how pain feels different or describe the pain. Continued traction today as she felt it helped last time. Pt tolerated DN well.    PT Treatment/Interventions  ADLs/Self Care Home Management;Cryotherapy;Traction;Moist Heat;Iontophoresis 4mg /ml Dexamethasone;Electrical Stimulation;Functional mobility training;Neuromuscular re-education;Therapeutic exercise;Therapeutic activities;Patient/family education;Manual techniques;Dry needling;Passive range of motion;Taping;Joint Manipulations;Spinal Manipulations    PT Next Visit Plan  post activation/strengthening    PT Home Exercise Plan  supine pec stretch, supine shoulder flexion, supine scap retraction; supine horiz abd, seated ER, open book, wrist extension stretch, levator & upper trap stretch    Consulted and Agree with Plan of Care  Patient       Patient will benefit from skilled therapeutic intervention in order to improve the following deficits and impairments:  Improper body mechanics, Pain, Postural dysfunction, Increased muscle spasms, Decreased activity tolerance, Decreased endurance, Decreased range of motion, Decreased strength,  Impaired flexibility  Visit Diagnosis: Cervicalgia  Chronic right shoulder pain  Chronic left shoulder  pain     Problem List Patient Active Problem List   Diagnosis Date Noted  . IFG (impaired fasting glucose) 07/11/2014  . High triglycerides 07/11/2014  . Appendicitis, acute, with peritonitis 02/20/2013  . Twin pregnancy, delivered vaginally, current hospitalization 04/21/2012  . Multiparity 02/18/2012  . Short cervix, antepartum, twins 01/14/2012  . Twin pregnancy, antepartum 01/01/2012  . AMA (advanced maternal age) multigravida 35+ 01/01/2012  . Late prenatal care 01/01/2012  . High-risk pregnancy supervision 01/01/2012   Gwenlyn FoundJessica C. Kasheem Toner PT, DPT 04/17/19 9:31 AM   [facility] [address] [place], [name], [address] Phone: [phone]   Fax:  [phone]  Name: Carrie Buchanan MRN: [id number] Date of Birth: 02/09/1976

## 2019-04-19 ENCOUNTER — Encounter: Payer: Self-pay | Admitting: Physical Therapy

## 2019-04-19 ENCOUNTER — Ambulatory Visit: Payer: Self-pay | Admitting: Physical Therapy

## 2019-04-19 ENCOUNTER — Other Ambulatory Visit: Payer: Self-pay

## 2019-04-19 DIAGNOSIS — M542 Cervicalgia: Secondary | ICD-10-CM

## 2019-04-19 DIAGNOSIS — M25512 Pain in left shoulder: Secondary | ICD-10-CM

## 2019-04-19 DIAGNOSIS — G8929 Other chronic pain: Secondary | ICD-10-CM

## 2019-04-19 NOTE — Therapy (Signed)
[facility] [address] [place], [name], [address] Phone: [phone]   Fax:  [phone]  Physical Therapy Treatment  Patient Details  Name: Carrie ParodyRosa Buchanan Buchanan MRN: [id number] Date of Birth: 09/19/1976 Referring Provider (PT): Claiborne RiggFleming, [name], NP   Encounter Date: 04/19/2019  PT End of Session - 04/19/19 0940    Visit Number  7    Number of Visits  13    Date for PT Re-Evaluation  05/12/19    Authorization Type  CAFA exp 6/30    PT Start Time  0900    PT Stop Time  0939    PT Time Calculation (min)  39 min    Activity Tolerance  Patient tolerated treatment well    Behavior During Therapy  [facility] for tasks assessed/performed       Past Medical History:  Diagnosis Date  . Appendicitis, acute, with peritonitis 02/20/2013  . Medical history non-contributory   . Prediabetes     Past Surgical History:  Procedure Laterality Date  . APPENDECTOMY    . INTRAUTERINE DEVICE INSERTION  09/2012  . LAPAROSCOPIC APPENDECTOMY N/A 02/09/2013   Procedure: APPENDECTOMY LAPAROSCOPIC;  Surgeon: Emelia LoronMatthew Wakefield, MD;  [facility];  Service: General;  Laterality: N/A;    There were no vitals filed for this visit.  Subjective Assessment - 04/19/19 0904    Subjective  I am a little better now, just a little sore where the needles were. Here in the arm and this bone back here (rubbing medial border of scapula). Woke with pain behind Right ear.    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                       [facility] Adult PT Treatment/Exercise - 04/19/19 0001      Shoulder Exercises: Standing   External Rotation  10 reps    External Rotation Limitations  AROM back to wall    Extension  10 reps    Theraband Level (Shoulder Extension)  Level 2 (Red)    Row  15 reps    Theraband Level (Shoulder Row)  Level 2 (Red)      Shoulder Exercises: Stretch   Other Shoulder Stretches  chest stretch hands behind  back- held washcloth, could not touch hands behind      Manual Therapy   Manual therapy comments  skilled palpation and monitoring during TPDN    Soft tissue mobilization  Rt SCM, Lt deltoic & wrist estensor grou IASMT       Trigger Point Dry Needling - 04/19/19 0001    Muscles Treated Head and Neck  Sternocleidomastoid    Sternocleidomastoid Response  Twitch response elicited;Palpable increased muscle length   Right            PT Short Term Goals - 04/12/19 1120      PT SHORT TERM GOAL #1   Title  Pt will demonstrate proper activation of periscapular region for appropriate resting posture    Baseline  able to demo proper posture & activation but does not maintain the position due to fatigue and discomfort    Status  Achieved        PT Long Term Goals - 03/29/19 1633      PT LONG TERM GOAL #1   Title  Pt will be able to sleep comfortably    Baseline  unable at eval    Time  6    Period  Weeks    Status  New    Target Date  05/12/19      PT LONG TERM GOAL #2   Title  Pt will be able to reach into high cabinets without limitation by pain    Baseline  unable at eval    Time  6    Period  Weeks    Status  New    Target Date  05/12/19      PT LONG TERM GOAL #3   Title  Pt will be able to complete household chores    Baseline  unable to complete due to pain at eval    Time  6    Period  Weeks    Status  New    Target Date  05/12/19      PT LONG TERM GOAL #4   Title  Pt will be able to work pain <=3/10    Baseline  severe and limiting at eval    Time  6    Period  Weeks    Status  New    Target Date  05/12/19            Plan - 04/19/19 0940    Clinical Impression Statement  DN to SCM resolved pain behind Rt ear today. Reported pain in Left arm during standing AROM ER which was improved following IASTM. Added strengthening exercises to HEP which pt tolerated well but required cues to avoid use of upper traps. Advised that she will be sore but it should  feel different than her pain.    PT Treatment/Interventions  ADLs/Self Care Home Management;Cryotherapy;Traction;Moist Heat;Iontophoresis 4mg /ml Dexamethasone;Electrical Stimulation;Functional mobility training;Neuromuscular re-education;Therapeutic exercise;Therapeutic activities;Patient/family education;Manual techniques;Dry needling;Passive range of motion;Taping;Joint Manipulations;Spinal Manipulations    PT Home Exercise Plan  supine pec stretch, supine shoulder flexion, supine scap retraction; supine horiz abd, seated ER, open book, wrist extension stretch, levator & upper trap stretch; row, straight arm pull, AROM ER, behind back chest stretch    Consulted and Agree with Plan of Care  Patient       Patient will benefit from skilled therapeutic intervention in order to improve the following deficits and impairments:  Improper body mechanics, Pain, Postural dysfunction, Increased muscle spasms, Decreased activity tolerance, Decreased endurance, Decreased range of motion, Decreased strength, Impaired flexibility  Visit Diagnosis: 1. Cervicalgia   2. Chronic right shoulder pain   3. Chronic left shoulder pain        Problem List Patient Active Problem List   Diagnosis Date Noted  . IFG (impaired fasting glucose) 07/11/2014  . High triglycerides 07/11/2014  . Appendicitis, acute, with peritonitis 02/20/2013  . Twin pregnancy, delivered vaginally, current hospitalization 04/21/2012  . Multiparity 02/18/2012  . Short cervix, antepartum, twins 01/14/2012  . Twin pregnancy, antepartum 01/01/2012  . AMA (advanced maternal age) multigravida 35+ 01/01/2012  . Late prenatal care 01/01/2012  . High-risk pregnancy supervision 01/01/2012    Gay Filler. Jayde Mcallister PT, DPT 04/19/19 9:43 AM   [facility] [address] [place], [place], [address] Phone: [phone]   Fax:  [phone]  Name: Carrie Buchanan MRN: [id number] Date of  Birth: [birth year]

## 2019-04-21 ENCOUNTER — Emergency Department (HOSPITAL_COMMUNITY): Payer: Self-pay

## 2019-04-21 ENCOUNTER — Other Ambulatory Visit: Payer: Self-pay

## 2019-04-21 ENCOUNTER — Emergency Department (HOSPITAL_COMMUNITY)
Admission: EM | Admit: 2019-04-21 | Discharge: 2019-04-21 | Disposition: A | Discharge status: Home / Self Care | Payer: Self-pay | Attending: Emergency Medicine | Admitting: Emergency Medicine

## 2019-04-21 DIAGNOSIS — R7303 Prediabetes: Secondary | ICD-10-CM | POA: Insufficient documentation

## 2019-04-21 DIAGNOSIS — R112 Nausea with vomiting, unspecified: Secondary | ICD-10-CM | POA: Insufficient documentation

## 2019-04-21 DIAGNOSIS — Z7984 Long term (current) use of oral hypoglycemic drugs: Secondary | ICD-10-CM | POA: Insufficient documentation

## 2019-04-21 DIAGNOSIS — K8020 Calculus of gallbladder without cholecystitis without obstruction: Secondary | ICD-10-CM | POA: Insufficient documentation

## 2019-04-21 DIAGNOSIS — R1013 Epigastric pain: Secondary | ICD-10-CM

## 2019-04-21 LAB — CBC WITH DIFFERENTIAL/PLATELET
Abs Immature Granulocytes: 0.05 10*3/uL (ref 0.00–0.07)
Basophils Absolute: 0 10*3/uL (ref 0.0–0.1)
Basophils Relative: 0 %
Eosinophils Absolute: 0.1 10*3/uL (ref 0.0–0.5)
Eosinophils Relative: 0 %
HCT: 40.4 % (ref 36.0–46.0)
Hemoglobin: 13.4 g/dL (ref 12.0–15.0)
Immature Granulocytes: 0 %
Lymphocytes Relative: 15 %
Lymphs Abs: 2 10*3/uL (ref 0.7–4.0)
MCH: 31 pg (ref 26.0–34.0)
MCHC: 33.2 g/dL (ref 30.0–36.0)
MCV: 93.5 fL (ref 80.0–100.0)
Monocytes Absolute: 0.5 10*3/uL (ref 0.1–1.0)
Monocytes Relative: 4 %
Neutro Abs: 10.3 10*3/uL — ABNORMAL HIGH (ref 1.7–7.7)
Neutrophils Relative %: 81 %
Platelets: 255 10*3/uL (ref 150–400)
RBC: 4.32 MIL/uL (ref 3.87–5.11)
RDW: 13.2 % (ref 11.5–15.5)
WBC: 12.9 10*3/uL — ABNORMAL HIGH (ref 4.0–10.5)
nRBC: 0 % (ref 0.0–0.2)

## 2019-04-21 LAB — URINALYSIS, ROUTINE W REFLEX MICROSCOPIC
Bilirubin Urine: NEGATIVE
Glucose, UA: NEGATIVE mg/dL
Hgb urine dipstick: NEGATIVE
Ketones, ur: NEGATIVE mg/dL
Leukocytes,Ua: NEGATIVE
Nitrite: NEGATIVE
Protein, ur: NEGATIVE mg/dL
Specific Gravity, Urine: 1.023 (ref 1.005–1.030)
pH: 7 (ref 5.0–8.0)

## 2019-04-21 LAB — COMPREHENSIVE METABOLIC PANEL
ALT: 25 U/L (ref 0–44)
AST: 20 U/L (ref 15–41)
Albumin: 4 g/dL (ref 3.5–5.0)
Alkaline Phosphatase: 61 U/L (ref 38–126)
Anion gap: 8 (ref 5–15)
BUN: 16 mg/dL (ref 6–20)
CO2: 22 mmol/L (ref 22–32)
Calcium: 9.2 mg/dL (ref 8.9–10.3)
Chloride: 107 mmol/L (ref 98–111)
Creatinine, Ser: 0.73 mg/dL (ref 0.44–1.00)
GFR calc Af Amer: >60 mL/min (ref >60)
GFR calc non Af Amer: >60 mL/min (ref >60)
Glucose, Bld: 164 mg/dL — ABNORMAL HIGH (ref 70–99)
Potassium: 3.8 mmol/L (ref 3.5–5.1)
Sodium: 137 mmol/L (ref 135–145)
Total Bilirubin: 0.7 mg/dL (ref 0.3–1.2)
Total Protein: 7.4 g/dL (ref 6.5–8.1)

## 2019-04-21 LAB — POC URINE PREG, ED: Preg Test, Ur: NEGATIVE

## 2019-04-21 LAB — LIPASE, BLOOD: Lipase: 26 U/L (ref 11–51)

## 2019-04-21 MED ORDER — SODIUM CHLORIDE 0.9 % IV BOLUS
1000.0000 mL | Freq: Once | INTRAVENOUS | Status: AC
  Administered 2019-04-21: 1000 mL via INTRAVENOUS

## 2019-04-21 MED ORDER — PROMETHAZINE HCL 25 MG PO TABS: 25.0000 mg | ORAL_TABLET | Freq: Four times a day (QID) | ORAL | 0 refills | Status: DC | PRN

## 2019-04-21 MED ORDER — ONDANSETRON HCL 4 MG/2ML IJ SOLN
4.0000 mg | Freq: Once | INTRAMUSCULAR | Status: AC
  Administered 2019-04-21: 4 mg via INTRAVENOUS
  Filled 2019-04-21: qty 2

## 2019-04-21 MED ORDER — HYDROCODONE-ACETAMINOPHEN 5-325 MG PO TABS: 1.0000 | ORAL_TABLET | Freq: Four times a day (QID) | ORAL | 0 refills | Status: DC | PRN

## 2019-04-21 NOTE — ED Provider Notes (Signed)
[facility] EMERGENCY DEPARTMENT Provider Note   CSN: [id number] Arrival date & time: 04/21/19  1033    History   Chief Complaint Chief Complaint  Patient presents with  . Abdominal Pain    HPI Carrie Buchanan is a 43 y.o. female presenting to the emergency department with complaint of 1 week of abdominal pain.  Patient states pain is epigastric, sharp, and intermittent, worse postprandial.  She has developed associated nausea with vomiting that began today.  Currently in the ED, she is not in any pain.  She denies dizziness, contrary to triage note, however states she feels weak.  She has treated her symptoms with over-the-counter medicines without much relief.  She states she normally eats lots of spicy food, however she noticed this made her symptoms worse so she has cut down over the last week.  Past abdominal surgeries include appendectomy.  No fevers, chills, urinary symptoms, diarrhea, constipation or other complaints.  Does not drink alcohol.     The history is provided by the patient. A language interpreter was used.    Past Medical History:  Diagnosis Date  . Appendicitis, acute, with peritonitis 02/20/2013  . Medical history non-contributory   . Prediabetes     Patient Active Problem List   Diagnosis Date Noted  . IFG (impaired fasting glucose) 07/11/2014  . High triglycerides 07/11/2014  . Appendicitis, acute, with peritonitis 02/20/2013  . Twin pregnancy, delivered vaginally, current hospitalization 04/21/2012  . Multiparity 02/18/2012  . Short cervix, antepartum, twins 01/14/2012  . Twin pregnancy, antepartum 01/01/2012  . AMA (advanced maternal age) multigravida 35+ 01/01/2012  . Late prenatal care 01/01/2012  . High-risk pregnancy supervision 01/01/2012    Past Surgical History:  Procedure Laterality Date  . APPENDECTOMY    . INTRAUTERINE DEVICE INSERTION  09/2012  . LAPAROSCOPIC APPENDECTOMY N/A 02/09/2013   Procedure:  APPENDECTOMY LAPAROSCOPIC;  Surgeon: Rolm Bookbinder, MD;  Location: [facility] OR;  Service: General;  Laterality: N/A;     OB History    Gravida  7   Para  6   Term  6   Preterm      AB  1   Living  7     SAB  1   TAB      Ectopic      Multiple  1   Live Births  6            Home Medications    Prior to Admission medications   Medication Sig Start Date End Date Taking? Authorizing Provider  Cholecalciferol (VITAMIN D3) 2000 units CHEW Chew by mouth.   Yes [provider]  metFORMIN (GLUCOPHAGE XR) 500 MG 24 hr tablet Take 1 tablet (500 mg total) by mouth daily with breakfast. 01/10/19  Yes Gildardo Pounds, NP  HYDROcodone-acetaminophen (NORCO/VICODIN) 5-325 MG tablet Take 1-2 tablets by mouth every 6 (six) hours as needed for severe pain. 04/21/19   , [place] N, PA-C  naproxen (NAPROSYN) 500 MG tablet Take 1 tablet (500 mg total) by mouth 2 (two) times daily with a meal. Patient not taking: Reported on 04/21/2019 01/10/19   Gildardo Pounds, NP  promethazine (PHENERGAN) 25 MG tablet Take 1 tablet (25 mg total) by mouth every 6 (six) hours as needed for nausea or vomiting. 04/21/19   , [place] N, PA-C    Family History Family History  Problem Relation Age of Onset  . Asthma Mother     Social History Social History   Tobacco  Use  . Smoking status: Never Smoker  . Smokeless tobacco: Never Used  Substance Use Topics  . Alcohol use: No  . Drug use: No     Allergies   Patient has no known allergies.   Review of Systems Review of Systems  Constitutional: Negative for chills and fever.  Gastrointestinal: Positive for abdominal pain, nausea and vomiting. Negative for constipation and diarrhea.  Genitourinary: Negative for dysuria, frequency, vaginal bleeding and vaginal discharge.  All other systems reviewed and are negative.    Physical Exam Updated Vital Signs BP 109/64 (BP Location: Right Arm)   Pulse 68   Temp 97.9 F (36.6 C)    Resp 14   Ht 4' 11.06" (1.5 m)   Wt 70.3 kg   SpO2 99%   BMI 31.25 kg/m   Physical Exam Vitals signs and nursing note reviewed.  Constitutional:      General: She is not in acute distress.    Appearance: She is well-developed. She is not ill-appearing.     Comments: Well-appearing, no distress  HENT:     Head: Normocephalic and atraumatic.     Mouth/Throat:     Mouth: Mucous membranes are moist.  Eyes:     Conjunctiva/sclera: Conjunctivae normal.  Cardiovascular:     Rate and Rhythm: Normal rate and regular rhythm.  Pulmonary:     Effort: Pulmonary effort is normal. No respiratory distress.     Breath sounds: Normal breath sounds.  Abdominal:     General: Abdomen is flat. Bowel sounds are normal. There is no distension.     Palpations: Abdomen is soft.     Tenderness: There is abdominal tenderness in the right upper quadrant and epigastric area. There is no guarding or rebound. Negative signs include Murphy's sign.  Skin:    General: Skin is warm.  Neurological:     Mental Status: She is alert.  Psychiatric:        Behavior: Behavior normal.      ED Treatments / Results  Labs (all labs ordered are listed, but only abnormal results are displayed) Labs Reviewed  COMPREHENSIVE METABOLIC PANEL - Abnormal; Notable for the following components:      Result Value   Glucose, Bld 164 (*)    All other components within normal limits  CBC WITH DIFFERENTIAL/PLATELET - Abnormal; Notable for the following components:   WBC 12.9 (*)    Neutro Abs 10.3 (*)    All other components within normal limits  URINALYSIS, ROUTINE W REFLEX MICROSCOPIC - Abnormal; Notable for the following components:   APPearance HAZY (*)    All other components within normal limits  LIPASE, BLOOD  POC URINE PREG, ED    EKG None  Radiology [place] Abdomen Limited Ruq  Result Date: 04/21/2019 CLINICAL DATA:  Epigastric pain for a week. EXAM: ULTRASOUND ABDOMEN LIMITED RIGHT UPPER QUADRANT  COMPARISON:  None. FINDINGS: Gallbladder: Multiple gallstones, largest measuring 1.2 cm. No gallbladder wall thickening, pericholecystic fluid or other secondary signs of acute cholecystitis. No sonographic Murphy's sign elicited per the sonographer. Common bile duct: Diameter: 6 mm Liver: No focal lesion identified. Within normal limits in parenchymal echogenicity. Portal vein is patent on color Doppler imaging with normal direction of blood flow towards the liver. IMPRESSION: 1. Cholelithiasis without evidence of acute cholecystitis. 2. No acute findings. Electronically Signed   By: Bary RichardStan  Maynard M.D.   On: 04/21/2019 12:18    Procedures Procedures (including critical care time)  Medications Ordered in ED Medications  ondansetron [facility](ZOFRAN) injection 4 mg (4 mg Intravenous Given 04/21/19 1138)  sodium chloride 0.9 % bolus 1,000 mL (0 mLs Intravenous Stopped 04/21/19 1311)     Initial Impression / Assessment and Plan / ED Course  I have reviewed the triage vital signs and the nursing notes.  Pertinent labs & imaging results that were available during my care of the patient were reviewed by me and considered in my medical decision making (see chart for details).        Patient presenting with postprandial epigastric abdominal pain with nausea and vomiting.  Afebrile, no distress.  Vital signs are normal.  Abdominal exam with some epigastric and right upper quadrant tenderness, negative Murphy sign.  No peritoneal signs.  History is consistent with cholelithiasis, labs and right upper quadrant ultrasound obtained.  Antiemetics and IV fluids administered. Labs with normal hepatic function, normal lipase, negative pregnancy. Right upper quadrant ultrasound with cholelithiasis, no evidence of cholecystitis.  CBC with slight leukocytosis, likely stress reaction after vomiting today.  Low suspicion for cholecystitis, afebrile, normal LFTs.  Discussed with Dr. Jeraldine LootsLockwood.  She has close PCP follow-up,  encouraged to follow-up in the next 3 days.  Will discharge with symptomatic management and outpatient referral to general surgery for further treatment options.  Strict return precautions discussed.  Patient verbalized understanding agrees with care plan.  [place] Cont[place] System queried  Discussed results, findings, treatment and follow up. Patient advised of return precautions. Patient verbalized understanding and agreed with plan.   Final Clinical Impressions(s) / ED Diagnoses   Final diagnoses:  Epigastric abdominal pain  Calculus of gallbladder without cholecystitis without obstruction    ED Discharge Orders         Ordered    HYDROcodone-acetaminophen (NORCO/VICODIN) 5-325 MG tablet  Every 6 hours PRN     04/21/19 1312    promethazine (PHENERGAN) 25 MG tablet  Every 6 hours PRN     04/21/19 1312           , [place] N, [place]PA-C 04/21/19 1324    Gerhard MunchLockwood, Robert, MD 04/21/19 1512

## 2019-04-21 NOTE — ED Triage Notes (Signed)
Pt here for evaluation of dizziness onset this morning upon waking. Sts when she sat up in bed she would fall back down. Endorses generalized abdominal pain x 1 week and three episodes of vomiting today. Denies diarrhea.

## 2019-04-21 NOTE — ED Notes (Signed)
Patient transported to Ultrasound 

## 2019-04-21 NOTE — Discharge Instructions (Addendum)
Please read instructions below. You can treat your symptoms with ibuprofen every 6 hours as needed for pain. If you develop severe pain, you can take the hydrocodone every 6 hours.  Be aware this medication can make you drowsy, do not drive or drink alcohol while taking it.  There is Tylenol in this medication, do not take Tylenol with it. You can take Phenergan every 6 hours as needed for nausea. Avoid/limit fatty/greasy meals, spicy foods, alcohol.  All of these things can worsen your symptoms. Schedule an appointment with the surgeon to discuss treatment options. In the meantime, follow-up closely with your primary care provider within the next few days. Return to the emergency department if you develop severely worsening pain, fever, or uncontrollable vomiting.  Por favor, lea las instrucciones a continuacin. Puede tratar sus sntomas con ibuprofeno cada 6 horas segn sea necesario para el dolor. Si desarrolla dolor intenso, puede tomar la hidrocodona cada 6 horas. Tenga en cuenta que este medicamento puede causar somnolencia, no conduzca ni beba alcohol mientras lo toma. Hay Tylenol en este medicamento, no tome Tylenol con l. Puede tomar Phenergan cada 6 horas segn sea necesario para las nuseas. Evite / limite las comidas grasas / grasas, las comidas picantes, el alcohol. Todas estas cosas pueden empeorar sus sntomas. Programe una cita con el cirujano para discutir las opciones de [place]. Vernell Barrier, haga un seguimiento cercano con su proveedor de atencin [employer]. Regrese al departamento de emergencias si desarrolla un dolor que empeora severamente, fiebre o vmitos incontrolables.

## 2019-04-24 ENCOUNTER — Ambulatory Visit: Payer: Self-pay | Admitting: Physical Therapy

## 2019-04-24 ENCOUNTER — Encounter: Payer: Self-pay | Admitting: Physical Therapy

## 2019-04-24 ENCOUNTER — Other Ambulatory Visit: Payer: Self-pay

## 2019-04-24 DIAGNOSIS — M25511 Pain in right shoulder: Secondary | ICD-10-CM

## 2019-04-24 DIAGNOSIS — M25512 Pain in left shoulder: Secondary | ICD-10-CM

## 2019-04-24 DIAGNOSIS — M542 Cervicalgia: Secondary | ICD-10-CM

## 2019-04-24 DIAGNOSIS — G8929 Other chronic pain: Secondary | ICD-10-CM

## 2019-04-24 NOTE — Therapy (Signed)
[facility] [address] [place], [name], [address] Phone: [phone]   Fax:  [phone]  Physical Therapy Treatment  Patient Details  Name: Carrie ParodyRosa Aguilar Buchanan MRN: [id number] Date of Birth: 06/26/1976 Referring Provider (PT): Claiborne RiggFleming, [name], NP   Encounter Date: 04/24/2019  PT End of Session - 04/24/19 1114    Visit Number  8    Number of Visits  13    Date for PT Re-Evaluation  05/12/19    Authorization Type  CAFA exp 6/30    PT Start Time  1100    PT Stop Time  1114    PT Time Calculation (min)  14 min    Activity Tolerance  Treatment limited secondary to medical complications (Comment)       Past Medical History:  Diagnosis Date  . Appendicitis, acute, with peritonitis 02/20/2013  . Medical history non-contributory   . Prediabetes     Past Surgical History:  Procedure Laterality Date  . APPENDECTOMY    . INTRAUTERINE DEVICE INSERTION  09/2012  . LAPAROSCOPIC APPENDECTOMY N/A 02/09/2013   Procedure: APPENDECTOMY LAPAROSCOPIC;  Surgeon: Emelia LoronMatthew Wakefield, MD;  [facility];  Service: General;  Laterality: N/A;    There were no vitals filed for this visit.  Subjective Assessment - 04/24/19 1102    Subjective  I feel a little better in my neck, pain in Rt upper trap & into posterior arm. Right when I got out of bed on Friday morning, I was very dizzy and felt like I was going to fall. It is not as bad today. I do not feel the room is spinning.                               PT Education - 04/24/19 1119    Education Details  see plan    Person(s) Educated  Patient    Methods  Explanation    Comprehension  Verbalized understanding       PT Short Term Goals - 04/12/19 1120      PT SHORT TERM GOAL #1   Title  Pt will demonstrate proper activation of periscapular region for appropriate resting posture    Baseline  able to demo proper posture & activation but does not maintain the  position due to fatigue and discomfort    Status  Achieved        PT Long Term Goals - 03/29/19 1633      PT LONG TERM GOAL #1   Title  Pt will be able to sleep comfortably    Baseline  unable at eval    Time  6    Period  Weeks    Status  New    Target Date  05/12/19      PT LONG TERM GOAL #2   Title  Pt will be able to reach into high cabinets without limitation by pain    Baseline  unable at eval    Time  6    Period  Weeks    Status  New    Target Date  05/12/19      PT LONG TERM GOAL #3   Title  Pt will be able to complete household chores    Baseline  unable to complete due to pain at eval    Time  6    Period  Weeks    Status  New    Target Date  05/12/19      PT LONG TERM GOAL #4   Title  Pt will be able to work pain <=3/10    Baseline  severe and limiting at eval    Time  6    Period  Weeks    Status  New    Target Date  05/12/19            Plan - 04/24/19 1114    Clinical Impression Statement  Pt arrived to PT today with complaints of dizziness that began last friday- s/s consistent with postural hypotension with no indications for vestibular involvement. We discussed the type of dizziness and importance of getting re-hydrated after vomitting and being in the hospital.She will focus on gentle stretches for her neck, rest, hydrate and contact PCP. We agreed that PT was not appropriate today as she is still not feeling well and cramping all over her body with continued dizziness upon standing.    PT Treatment/Interventions  ADLs/Self Care Home Management;Cryotherapy;Traction;Moist Heat;Iontophoresis 4mg /ml Dexamethasone;Electrical Stimulation;Functional mobility training;Neuromuscular re-education;Therapeutic exercise;Therapeutic activities;Patient/family education;Manual techniques;Dry needling;Passive range of motion;Taping;Joint Manipulations;Spinal Manipulations    PT Next Visit Plan  post activation/strengthening    PT Home Exercise Plan  supine pec  stretch, supine shoulder flexion, supine scap retraction; supine horiz abd, seated ER, open book, wrist extension stretch, levator & upper trap stretch; row, straight arm pull, AROM ER, behind back chest stretch    Consulted and Agree with Plan of Care  Patient       Patient will benefit from skilled therapeutic intervention in order to improve the following deficits and impairments:  Improper body mechanics, Pain, Postural dysfunction, Increased muscle spasms, Decreased activity tolerance, Decreased endurance, Decreased range of motion, Decreased strength, Impaired flexibility  Visit Diagnosis: 1. Cervicalgia   2. Chronic left shoulder pain   3. Chronic right shoulder pain        Problem List Patient Active Problem List   Diagnosis Date Noted  . IFG (impaired fasting glucose) 07/11/2014  . High triglycerides 07/11/2014  . Appendicitis, acute, with peritonitis 02/20/2013  . Twin pregnancy, delivered vaginally, current hospitalization 04/21/2012  . Multiparity 02/18/2012  . Short cervix, antepartum, twins 01/14/2012  . Twin pregnancy, antepartum 01/01/2012  . AMA (advanced maternal age) multigravida 35+ 01/01/2012  . Late prenatal care 01/01/2012  . High-risk pregnancy supervision 01/01/2012    Gay Filler. Irish Breisch PT, DPT 04/24/19 11:20 AM   [facility] [address] [place], [place], [address] Phone: [phone]   Fax:  [phone]  Name: Carrie Buchanan MRN: [id number] Date of Birth: [birth year]

## 2019-04-26 ENCOUNTER — Ambulatory Visit: Payer: Self-pay | Admitting: Physical Therapy

## 2019-05-01 ENCOUNTER — Encounter: Payer: Self-pay | Admitting: Physical Therapy

## 2019-05-01 ENCOUNTER — Other Ambulatory Visit: Payer: Self-pay

## 2019-05-01 ENCOUNTER — Ambulatory Visit: Payer: Self-pay | Admitting: Physical Therapy

## 2019-05-01 DIAGNOSIS — M542 Cervicalgia: Secondary | ICD-10-CM

## 2019-05-01 DIAGNOSIS — G8929 Other chronic pain: Secondary | ICD-10-CM

## 2019-05-01 DIAGNOSIS — M25512 Pain in left shoulder: Secondary | ICD-10-CM

## 2019-05-01 NOTE — Therapy (Signed)
[place], [place], [address] Phone: [phone]   Fax:  [phone]  Physical Therapy Treatment  Patient Details  Name: Carrie Buchanan MRN: [id number] Date of Birth: 29-Jul-1976 Referring Provider (PT): Gildardo Pounds, NP   Encounter Date: 05/01/2019  PT End of Session - 05/01/19 1105    Visit Number  9    Number of Visits  13    Date for PT Re-Evaluation  05/12/19    Authorization Type  CAFA exp 6/30    PT Start Time  1023    PT Stop Time  1105    PT Time Calculation (min)  42 min    Activity Tolerance  Patient tolerated treatment well    Behavior During Therapy  [facility] for tasks assessed/performed       Past Medical History:  Diagnosis Date  . Appendicitis, acute, with peritonitis 02/20/2013  . Medical history non-contributory   . Prediabetes     Past Surgical History:  Procedure Laterality Date  . APPENDECTOMY    . INTRAUTERINE DEVICE INSERTION  09/2012  . LAPAROSCOPIC APPENDECTOMY N/A 02/09/2013   Procedure: APPENDECTOMY LAPAROSCOPIC;  Surgeon: Rolm Bookbinder, MD;  Location: [place];  Service: General;  Laterality: N/A;    There were no vitals filed for this visit.  Subjective Assessment - 05/01/19 1026    Subjective  Some days I am good, other days I feel the pain. Yesterday was very bad but today is better.    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      Sensation   Additional Comments  denies N/T  in hands but feels they are swollen      Posture/Postural Control   Posture Comments  rounded shoulders, anterior pelvic tilt      AROM   Right Shoulder ABduction  130 Degrees    Left Shoulder ABduction  134 Degrees    Cervical - Right Side Bend  24    Cervical - Left Side Bend  26    Cervical - Right Rotation  70    Cervical - Left Rotation  68                   [facility] Adult PT Treatment/Exercise - 05/01/19 0001      Shoulder Exercises: Standing   External Rotation  15 reps    Theraband Level (Shoulder External Rotation)  Level 2 (Red)    Row  15 reps    Theraband Level (Shoulder Row)  Level 4 (Blue)      Shoulder Exercises: ROM/Strengthening   UBE (Upper Arm Bike)  retro 3 min L1      Shoulder Exercises: Stretch   Other Shoulder Stretches  door chest stretch      Manual Therapy   Manual therapy comments  edu on self STM with tennis bal    Soft tissue mobilization  Rt pectoralis, IASTM Rt anterior & medial deltoid    Passive ROM  Rt GHJ    Manual Traction  Rt GHJ distraction               PT Short Term Goals - 04/12/19 1120      PT SHORT TERM GOAL #1   Title  Pt will demonstrate proper activation of periscapular region for appropriate resting posture    Baseline  able to demo proper posture & activation but does not maintain the position due to fatigue and discomfort    Status  Achieved        PT Long Term Goals - 03/29/19 1633      PT LONG TERM GOAL #1   Title  Pt will be able to sleep comfortably    Baseline  unable at eval    Time  6    Period  Weeks    Status  New    Target Date  05/12/19      PT LONG TERM GOAL #2   Title  Pt will be able to reach into high cabinets without limitation by pain    Baseline  unable at eval    Time  6    Period  Weeks    Status  New    Target Date  05/12/19      PT LONG TERM GOAL #3   Title  Pt will be able to complete household chores    Baseline  unable to complete due to pain at eval    Time  6     Period  Weeks    Status  New    Target Date  05/12/19      PT LONG TERM GOAL #4   Title  Pt will be able to work pain <=3/10    Baseline  severe and limiting at eval    Time  6    Period  Weeks    Status  New    Target Date  05/12/19            Plan - 05/01/19 1107    Clinical Impression Statement  Trigger points and tightness in pectoralis and anterior deltoid limited motion. Felt that she could reach behind her more easily following manual therapy. Heavy cuing required for proper scpaular retraction to avoid shoulder hike. Will discontinue DN at this time as she feels it makes her dizzy.    PT Treatment/Interventions  ADLs/Self Care Home Management;Cryotherapy;Traction;Moist Heat;Iontophoresis 4mg /ml Dexamethasone;Electrical Stimulation;Functional mobility training;Neuromuscular re-education;Therapeutic exercise;Therapeutic activities;Patient/family education;Manual techniques;Dry needling;Passive range of motion;Taping;Joint Manipulations;Spinal Manipulations    PT Next Visit Plan  review HEP & condense    PT Home Exercise Plan  supine pec stretch, supine shoulder flexion, supine scap retraction; supine horiz abd, seated ER, open book, wrist extension stretch, levator & upper trap stretch; row, straight arm pull, AROM ER, behind back chest stretch    Consulted and Agree with Plan of Care  Patient       Patient will benefit from skilled therapeutic intervention in order to improve the following deficits and impairments:  Improper body mechanics, Pain, Postural dysfunction, Increased muscle spasms, Decreased activity tolerance, Decreased endurance, Decreased range of motion, Decreased strength, Impaired flexibility  Visit Diagnosis: 1. Cervicalgia   2. Chronic left shoulder pain   3. Chronic right shoulder pain        Problem List Patient Active Problem List  Diagnosis Date Noted  . IFG (impaired fasting glucose) 07/11/2014  . High triglycerides 07/11/2014  .  Appendicitis, acute, with peritonitis 02/20/2013  . Twin pregnancy, delivered vaginally, current hospitalization 04/21/2012  . Multiparity 02/18/2012  . Short cervix, antepartum, twins 01/14/2012  . Twin pregnancy, antepartum 01/01/2012  . AMA (advanced maternal age) multigravida 35+ 01/01/2012  . Late prenatal care 01/01/2012  . High-risk pregnancy supervision 01/01/2012    Gwenlyn FoundJessica C. Alianis Trimmer PT, DPT 05/01/19 12:04 PM   [facility] [address] [place], [name], [address] Phone: [phone]   Fax:  [phone]  Name: Exie ParodyRosa Aguilar Ramirez MRN: [id number] Date of Birth: 04/12/1976

## 2019-05-03 ENCOUNTER — Encounter: Payer: Self-pay | Admitting: Physical Therapy

## 2019-05-03 ENCOUNTER — Ambulatory Visit: Payer: Self-pay | Attending: Nurse Practitioner | Admitting: Nurse Practitioner

## 2019-05-03 ENCOUNTER — Encounter: Payer: Self-pay | Admitting: Nurse Practitioner

## 2019-05-03 ENCOUNTER — Other Ambulatory Visit: Payer: Self-pay

## 2019-05-03 ENCOUNTER — Ambulatory Visit: Payer: Self-pay | Attending: Nurse Practitioner | Admitting: Physical Therapy

## 2019-05-03 DIAGNOSIS — M542 Cervicalgia: Secondary | ICD-10-CM | POA: Insufficient documentation

## 2019-05-03 DIAGNOSIS — M25511 Pain in right shoulder: Secondary | ICD-10-CM | POA: Insufficient documentation

## 2019-05-03 DIAGNOSIS — G8929 Other chronic pain: Secondary | ICD-10-CM | POA: Insufficient documentation

## 2019-05-03 DIAGNOSIS — M25512 Pain in left shoulder: Secondary | ICD-10-CM | POA: Insufficient documentation

## 2019-05-03 MED ORDER — CYCLOBENZAPRINE HCL 5 MG PO TABS: 5.0000 mg | ORAL_TABLET | Freq: Three times a day (TID) | ORAL | 1 refills | Status: DC | PRN

## 2019-05-03 MED ORDER — NAPROXEN 500 MG PO TABS: 500.0000 mg | ORAL_TABLET | Freq: Two times a day (BID) | ORAL | 1 refills | Status: DC

## 2019-05-03 MED FILL — CYCLOBENZAPRINE 5 MG TABLET: 5 | 20 days supply | Qty: 60 | Fill #0

## 2019-05-03 MED FILL — ?NAPROXEN 500 MG TABS: 500 | 30 days supply | Qty: 60 | Fill #0

## 2019-05-03 NOTE — Therapy (Signed)
[place], [place], [address] Phone: [phone]   Fax:  [phone]  Physical Therapy Treatment  Patient Details  Name: Carrie Buchanan MRN: [id number] Date of Birth: September 29, 1976 Referring Provider (PT): Gildardo Pounds, NP   Encounter Date: 05/03/2019  PT End of Session - 05/03/19 1142    Visit Number  10    Number of Visits  13    Date for PT Re-Evaluation  05/12/19    Authorization Type  CAFA exp 6/30    PT Start Time  1100    PT Stop Time  1142    PT Time Calculation (min)  42 min    Activity Tolerance  Patient tolerated treatment well    Behavior During Therapy  [facility] for tasks assessed/performed       Past Medical History:  Diagnosis Date  . Appendicitis, acute, with peritonitis 02/20/2013  . Medical history non-contributory   . Prediabetes     Past Surgical History:  Procedure Laterality Date  . APPENDECTOMY    . INTRAUTERINE DEVICE INSERTION  09/2012  . LAPAROSCOPIC APPENDECTOMY N/A 02/09/2013   Procedure: APPENDECTOMY LAPAROSCOPIC;  Surgeon: Rolm Bookbinder, MD;  Location: [place];  Service: General;  Laterality: N/A;    There were no vitals filed for this visit.  Subjective Assessment - 05/03/19 1102    Subjective  Today, I am feeling much better. 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, work- Tax inspector                       [place] Adult PT Treatment/Exercise - 05/03/19 0001      Manual Therapy   Manual Therapy  Taping    Joint Mobilization  AC & [place] mobilization gr 2    Soft tissue mobilization  IASTM- pectoralis maj & minor, subclavius, SCM, scalenes    Kinesiotex  Ligament Correction;Inhibit Muscle      Kinesiotix   Inhibit Muscle   scalenes Lt    Ligament Correction  star Lt [place] joint               PT Short Term Goals - 04/12/19 1120      PT SHORT TERM GOAL #1   Title   Pt will demonstrate proper activation of periscapular region for appropriate resting posture    Baseline  able to demo proper posture & activation but does not maintain the position due to fatigue and discomfort    Status  Achieved        PT Long Term Goals - 03/29/19 1633      PT LONG TERM GOAL #1   Title  Pt will be able to sleep comfortably    Baseline  unable at eval    Time  6    Period  Weeks    Status  New    Target Date  05/12/19      PT LONG TERM GOAL #2   Title  Pt will be able to reach into high cabinets without limitation by pain    Baseline  unable at eval    Time  6    Period  Weeks    Status  New    Target Date  05/12/19      PT LONG TERM GOAL #3   Title  Pt will be able to complete household chores  Baseline  unable to complete due to pain at eval    Time  6    Period  Weeks    Status  New    Target Date  05/12/19      PT LONG TERM GOAL #4   Title  Pt will be able to work pain <=3/10    Baseline  severe and limiting at eval    Time  6    Period  Weeks    Status  New    Target Date  05/12/19            Plan - 05/03/19 1204    Clinical Impression Statement  [facility] likely due to traction created by accident. Improved with IASTM today and tape but advised that she will likely feel discomfort for a while as joint continues to heal. We discussed her accident again as well as injuries caused by this- increased time required as interpreter was utilized.    PT Treatment/Interventions  ADLs/Self Care Home Management;Cryotherapy;Traction;Moist Heat;Iontophoresis 4mg /ml Dexamethasone;Electrical Stimulation;Functional mobility training;Neuromuscular re-education;Therapeutic exercise;Therapeutic activities;Patient/family education;Manual techniques;Dry needling;Passive range of motion;Taping;Joint Manipulations;Spinal Manipulations    PT Next Visit Plan  outcome of manual around clavicle. HEP    PT Home Exercise Plan  supine pec  stretch, supine shoulder flexion, supine scap retraction; supine horiz abd, seated ER, open book, wrist extension stretch, levator & upper trap stretch; row, straight arm pull, AROM ER, behind back chest stretch    Consulted and Agree with Plan of Care  Patient       Patient will benefit from skilled therapeutic intervention in order to improve the following deficits and impairments:  Improper body mechanics, Pain, Postural dysfunction, Increased muscle spasms, Decreased activity tolerance, Decreased endurance, Decreased range of motion, Decreased strength, Impaired flexibility  Visit Diagnosis: 1. Cervicalgia   2. Chronic left shoulder pain   3. Chronic right shoulder pain        Problem List Patient Active Problem List   Diagnosis Date Noted  . IFG (impaired fasting glucose) 07/11/2014  . High triglycerides 07/11/2014  . Appendicitis, acute, with peritonitis 02/20/2013  . Twin pregnancy, delivered vaginally, current hospitalization 04/21/2012  . Multiparity 02/18/2012  . Short cervix, antepartum, twins 01/14/2012  . Twin pregnancy, antepartum 01/01/2012  . AMA (advanced maternal age) multigravida 35+ 01/01/2012  . Late prenatal care 01/01/2012  . High-risk pregnancy supervision 01/01/2012    Gwenlyn FoundJessica C. Erich Kochan PT, DPT 05/03/19 12:08 PM   [facility] [address] [place], [name], [address] Phone: [phone]   Fax:  [phone]  Name: Carrie Buchanan MRN: [id number] Date of Birth: 08/13/1976

## 2019-05-03 NOTE — Progress Notes (Signed)
Virtual Visit via Telephone Note Due to national recommendations of social distancing due to COVID 19, telehealth visit is felt to be most appropriate for this patient at this time.  I discussed the limitations, risks, security and privacy concerns of performing an evaluation and management service by telephone and the availability of in person appointments. I also discussed with the patient that there may be a patient responsible charge related to this service. The patient expressed understanding and agreed to proceed.    I connected with Carrie Buchanan on 05/03/19  at   1:30 PM EDT  EDT by telephone and verified that I am speaking with the correct person using two identifiers.   Consent I discussed the limitations, risks, security and privacy concerns of performing an evaluation and management service by telephone and the availability of in person appointments. I also discussed with the patient that there may be a patient responsible charge related to this service. The patient expressed understanding and agreed to proceed.   Location of Patient: Private Residence   [facility]    Persons participating in Telemedicine visit: Carrie DenverZelda Fleming FNP-BC [name]  Spanish Interpreter ID# [id number] Byrd HesselbachMaria   History of Present Illness: Telemedicine visit for: Bilateral shoulder pain  Cervicalgia Chronic and related to work related injury a few years ago.  She was carrying a vacuum cleaner on her back and the vacuum cleaner cord got stuck in the moving elevator. She attempted to catch the cord however she was unsuccessful and ended up falling with the weight of the vacuum cleaner around her shoulders and neck. She felt immediate pain in her neck and shoulders after the injury.  Endorses a cracking sensation when turning her head from side to side.  Current symptoms are pain in posterior cervical neck, occipital area,  bilateral shoulders (trapezius) (aching, crushing, sharp and stabbing in character; 6/10 in severity). Patient denies numbness, tingling. She is currently working with PT which she states is not very helpful. Endorses bilateral arm and shoulder pain that re occurs after her treatments. I have requested that she continue to practice her exercises at home as instructed. Will add flexeril to naproxen. She has a few more sessions to carry out with PT. Will await their recommendations upon completion of therapy.   Depression screen [facility] 01/10/2019 11/21/2018 10/05/2018 05/11/2018 04/13/2018  Decreased Interest 2 2 2  - 2  Down, Depressed, Hopeless 2 2 1  0 1  PHQ - 2 Score 4 4 3  0 3  Altered sleeping 2 2 2 1  0  Tired, decreased energy 1 2 1 1 1   Change in appetite 0 0 0 0 0  Feeling bad or failure about yourself  2 2 1  - 0  Trouble concentrating - 2 0 1 1  Moving slowly or fidgety/restless 3 1 1  0 0  Suicidal thoughts 0 0 0 0 0  PHQ-9 Score 12 13 8 3 5   Some recent data might be hidden      Past Medical History:  Diagnosis Date  . Appendicitis, acute, with peritonitis 02/20/2013  . Medical history non-contributory   . Prediabetes     Past Surgical History:  Procedure Laterality Date  . APPENDECTOMY    . INTRAUTERINE DEVICE INSERTION  09/2012  . LAPAROSCOPIC APPENDECTOMY N/A 02/09/2013   Procedure: APPENDECTOMY LAPAROSCOPIC;  Surgeon: Emelia LoronMatthew Wakefield, MD;  [facility];  Service: General;  Laterality: N/A;    Family History  Problem Relation Age of Onset  . Asthma Mother     Social History   Socioeconomic History  . Marital status: Married    Spouse name: Not on file  . Number of children: Not on file  . Years of education: Not on file  . Highest education level: Not on file  Occupational History  . Not on file  Social Needs  . Financial resource strain: Not on file  . Food insecurity    Worry: Not on file    Inability: Not on file  . Transportation needs    Medical: Not  on file    Non-medical: Not on file  Tobacco Use  . Smoking status: Never Smoker  . Smokeless tobacco: Never Used  Substance and Sexual Activity  . Alcohol use: No  . Drug use: No  . Sexual activity: Not Currently    Birth control/protection: I.U.D.  Lifestyle  . Physical activity    Days per week: Not on file    Minutes per session: Not on file  . Stress: Not on file  Relationships  . Social Herbalist on phone: Not on file    Gets together: Not on file    Attends religious service: Not on file    Active member of club or organization: Not on file    Attends meetings of clubs or organizations: Not on file    Relationship status: Not on file  Other Topics Concern  . Not on file  Social History Narrative  . Not on file     Observations/Objective: Awake, alert and oriented x 3   Review of Systems  Constitutional: Negative for fever, malaise/fatigue and weight loss.  HENT: Negative.  Negative for nosebleeds.   Eyes: Negative.  Negative for blurred vision, double vision and photophobia.  Respiratory: Negative.  Negative for cough and shortness of breath.   Cardiovascular: Negative.  Negative for chest pain, palpitations and leg swelling.  Gastrointestinal: Negative.  Negative for heartburn, nausea and vomiting.  Musculoskeletal: Positive for joint pain and neck pain. Negative for myalgias.       SEE HPI  Neurological: Negative.  Negative for dizziness, focal weakness, seizures and headaches.  Psychiatric/Behavioral: Negative.  Negative for suicidal ideas.    Assessment and Plan: Lety was seen today for arm pain.  Diagnoses and all orders for this visit:  Cervicalgia -     naproxen (NAPROSYN) 500 MG tablet; Take 1 tablet (500 mg total) by mouth 2 (two) times daily with a meal. Patient will pick up 05-04-2019 -     cyclobenzaprine (FLEXERIL) 5 MG tablet; Take 1 tablet (5 mg total) by mouth 3 (three) times daily as needed for muscle spasms. Patient will pick up  05-04-2019     Follow Up Instructions Return in about 6 weeks (around 06/14/2019).     I discussed the assessment and treatment plan with the patient. The patient was provided an opportunity to ask questions and all were answered. The patient agreed with the plan and demonstrated an understanding of the instructions.   The patient was advised to call back or seek an in-person evaluation if the symptoms worsen or if the condition fails to improve as anticipated.  I provided 21 minutes of non-face-to-face time during this encounter including median intraservice time, reviewing previous notes, labs, imaging, medications and explaining diagnosis and management.  Gildardo Pounds, FNP-BC

## 2019-05-08 ENCOUNTER — Encounter: Payer: Self-pay | Admitting: Physical Therapy

## 2019-05-08 ENCOUNTER — Ambulatory Visit: Payer: Self-pay | Admitting: Physical Therapy

## 2019-05-08 ENCOUNTER — Other Ambulatory Visit: Payer: Self-pay

## 2019-05-08 DIAGNOSIS — G8929 Other chronic pain: Secondary | ICD-10-CM

## 2019-05-08 DIAGNOSIS — M25511 Pain in right shoulder: Secondary | ICD-10-CM

## 2019-05-08 DIAGNOSIS — M542 Cervicalgia: Secondary | ICD-10-CM

## 2019-05-08 DIAGNOSIS — M25512 Pain in left shoulder: Secondary | ICD-10-CM

## 2019-05-08 NOTE — Therapy (Signed)
[place], [place], [address] Phone: [phone]   Fax:  [phone]  Physical Therapy Treatment  Patient Details  Name: Carrie Buchanan MRN: [id number] Date of Birth: 03-14-1976 Referring Provider (PT): Gildardo Pounds, NP   Encounter Date: 05/08/2019  PT End of Session - 05/08/19 1045    Visit Number  11    Number of Visits  13    Date for PT Re-Evaluation  05/12/19    Authorization Type  CAFA exp 6/30    PT Start Time  1000    PT Stop Time  1045    PT Time Calculation (min)  45 min    Activity Tolerance  Patient tolerated treatment well    Behavior During Therapy  [facility] for tasks assessed/performed       Past Medical History:  Diagnosis Date  . Appendicitis, acute, with peritonitis 02/20/2013  . Medical history non-contributory   . Prediabetes     Past Surgical History:  Procedure Laterality Date  . APPENDECTOMY    . INTRAUTERINE DEVICE INSERTION  09/2012  . LAPAROSCOPIC APPENDECTOMY N/A 02/09/2013   Procedure: APPENDECTOMY LAPAROSCOPIC;  Surgeon: Rolm Bookbinder, MD;  Location: [place];  Service: General;  Laterality: N/A;    There were no vitals filed for this visit.  Subjective Assessment - 05/08/19 1003    Subjective  Today I am not feeling well. pain on the right side. I feel a little dizzy and pain on the right side of my head moving toward the top of my head. Lt clavicle pain when reaching behind, it is better but I still have the pain. going to go pick up flexeril today. doing the exercises twice a day, I feel relaxed until I go to bed and I wake with pain.    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  Yes    Pain Score  --   it's really bad, I cannot put a number to it   Pain Location  Neck    Pain Orientation  Right    Pain Descriptors / Indicators  --   pain   Aggravating Factors   resting pain                        [facility] Adult PT Treatment/Exercise - 05/08/19 0001      Therapeutic Activites    Therapeutic Activities  Other Therapeutic Activities    Other Therapeutic Activities  sleep posture- rested in this position & discussed to see if she had concordant symptoms      Shoulder Exercises: Stretch   Other Shoulder Stretches  supine pec stretch      Manual Therapy   Soft tissue mobilization  IASTM Rt deltoid; suboccipitals- Rt & bil release               PT Short Term Goals - 04/12/19 1120      PT SHORT TERM GOAL #1   Title  Pt will demonstrate proper activation of periscapular region for appropriate resting posture    Baseline  able to demo proper posture & activation but does not maintain the position due to fatigue and discomfort    Status  Achieved        PT Long Term Goals - 03/29/19 1633      PT LONG TERM GOAL #1   Title  Pt will be able to sleep comfortably  Baseline  unable at eval    Time  6    Period  Weeks    Status  New    Target Date  05/12/19      PT LONG TERM GOAL #2   Title  Pt will be able to reach into high cabinets without limitation by pain    Baseline  unable at eval    Time  6    Period  Weeks    Status  New    Target Date  05/12/19      PT LONG TERM GOAL #3   Title  Pt will be able to complete household chores    Baseline  unable to complete due to pain at eval    Time  6    Period  Weeks    Status  New    Target Date  05/12/19      PT LONG TERM GOAL #4   Title  Pt will be able to work pain <=3/10    Baseline  severe and limiting at eval    Time  6    Period  Weeks    Status  New    Target Date  05/12/19            Plan - 05/08/19 1027    Clinical Impression Statement  Pt verbalizes ability to control pain and feel relaxed while using exercises through the day but wakes in pain after sleeping. pt was placed in proper sleeping posture and educated on pillows today. Was able to lay in sidelying  position for 7 minutes without numbness in arm while educating on posture. Feels dizzy today- Says she has had a bottle of water today but no breakfast, tried a home remedy for the first time this morning that somebody told her about for gall stones. Advised that she is doing well during the day so to try the pillows tonight and consider a night guard as she reports she clenches at night time.    PT Treatment/Interventions  ADLs/Self Care Home Management;Cryotherapy;Traction;Moist Heat;Iontophoresis 4mg /ml Dexamethasone;Electrical Stimulation;Functional mobility training;Neuromuscular re-education;Therapeutic exercise;Therapeutic activities;Patient/family education;Manual techniques;Dry needling;Passive range of motion;Taping;Joint Manipulations;Spinal Manipulations    PT Next Visit Plan  did she try sleep posture? ERO vs d/c?    PT Home Exercise Plan  supine pec stretch, supine shoulder flexion, supine scap retraction; supine horiz abd, seated ER, open book, wrist extension stretch, levator & upper trap stretch; row, straight arm pull, AROM ER, behind back chest stretch    Consulted and Agree with Plan of Care  Patient       Patient will benefit from skilled therapeutic intervention in order to improve the following deficits and impairments:  Improper body mechanics, Pain, Postural dysfunction, Increased muscle spasms, Decreased activity tolerance, Decreased endurance, Decreased range of motion, Decreased strength, Impaired flexibility  Visit Diagnosis: 1. Cervicalgia   2. Chronic left shoulder pain   3. Chronic right shoulder pain        Problem List Patient Active Problem List   Diagnosis Date Noted  . IFG (impaired fasting glucose) 07/11/2014  . High triglycerides 07/11/2014  . Appendicitis, acute, with peritonitis 02/20/2013  . Twin pregnancy, delivered vaginally, current hospitalization 04/21/2012  . Multiparity 02/18/2012  . Short cervix, antepartum, twins 01/14/2012  . Twin  pregnancy, antepartum 01/01/2012  . AMA (advanced maternal age) multigravida 35+ 01/01/2012  . Late prenatal care 01/01/2012  . High-risk pregnancy supervision 01/01/2012    [name] PT, DPT 05/08/19 10:57 AM  [facility] [address] [place], [name], [address] Phone: [phone]   Fax:  [phone]  Name: Carrie Buchanan MRN: [id number] Date of Birth: 12/25/1975

## 2019-05-10 ENCOUNTER — Ambulatory Visit: Payer: Self-pay | Admitting: Physical Therapy

## 2019-05-10 ENCOUNTER — Other Ambulatory Visit: Payer: Self-pay

## 2019-05-10 DIAGNOSIS — M542 Cervicalgia: Secondary | ICD-10-CM

## 2019-05-10 DIAGNOSIS — G8929 Other chronic pain: Secondary | ICD-10-CM

## 2019-05-10 NOTE — Therapy (Addendum)
[place], [place], [address] Phone: [phone]   Fax:  [phone]  Physical Therapy Treatment/Discharge  Patient Details  Name: Carrie Buchanan MRN: [id number] Date of Birth: May 22, 1976 Referring Provider (PT): Gildardo Pounds, NP   Encounter Date: 05/10/2019  PT End of Session - 05/10/19 0955    Visit Number  12    Number of Visits  20    Date for PT Re-Evaluation  06/07/19    PT Start Time  0955    PT Stop Time  1751    PT Time Calculation (min)  48 min       Past Medical History:  Diagnosis Date  . Appendicitis, acute, with peritonitis 02/20/2013  . Medical history non-contributory   . Prediabetes     Past Surgical History:  Procedure Laterality Date  . APPENDECTOMY    . INTRAUTERINE DEVICE INSERTION  09/2012  . LAPAROSCOPIC APPENDECTOMY N/A 02/09/2013   Procedure: APPENDECTOMY LAPAROSCOPIC;  Surgeon: Rolm Bookbinder, MD;  Location: [place];  Service: General;  Laterality: N/A;    There were no vitals filed for this visit.  Subjective Assessment - 05/10/19 0956    Subjective  Patient has been taking the Flexeril and she says it is helping some.    Currently in Pain?  Yes    Pain Score  4     Pain Location  Neck    Pain Orientation  Right    Pain Type  Acute pain                       [facility] Adult PT Treatment/Exercise - 05/10/19 0001      Shoulder Exercises: Stretch   Other Shoulder Stretches  lumbar rotation stretch in hooklying, SDLY Rt QL stretch and supine "C" position stretch for Rt QL      Manual Therapy   Manual Therapy  Joint mobilization;Soft tissue mobilization;Myofascial release    Joint Mobilization  lumbar PA mobs CPA and UPA; scapular mobs    Soft tissue mobilization  to Rt upper traps, levator scap, infraspinatus    Myofascial Release  TPR Rt infraspinatus, Levator, QL               PT Short Term Goals - 04/12/19 1120      PT SHORT  TERM GOAL #1   Title  Pt will demonstrate proper activation of periscapular region for appropriate resting posture    Baseline  able to demo proper posture & activation but does not maintain the position due to fatigue and discomfort    Status  Achieved        PT Long Term Goals - 05/10/19 1000      PT LONG TERM GOAL #1   Title  Pt will be able to sleep comfortably    Baseline  improved since starting medicine    Status  Partially Met      PT LONG TERM GOAL #2   Title  Pt will be able to reach into high cabinets without limitation by pain    Baseline  able to reach in cabinet but not with weighted items    Status  On-going      PT LONG TERM GOAL #3   Title  Pt will be able to complete household chores    Baseline  still has pain with mopping and lifting    Status  Partially Met      PT LONG TERM GOAL #  4   Title  Pt will be able to work pain <=3/10    Baseline  8/10 pain with cleaning    Status  On-going            Plan - 05/10/19 1052    Clinical Impression Statement  Patient reports improved sleeping since starting medication. She has progressed with reaching up to [facility] cabinets but still is unable to lift anything to this position. She also reports pain with mopping and lifting from the ground. Cleaning increases her pain to 8/10. Patient has significant tightness in her Right QL which may be contributing to her R UT/levator tightrness. We worked on loosening the QL today and while painful she reported relief afterwards.Patient would like to continue with PT and would benefit from PT to meet unmet goals. She forgot to call financial, so will do so before scheduling more visits.    PT Frequency  2x / week    PT Duration  4 weeks    PT Treatment/Interventions  ADLs/Self Care Home Management;Cryotherapy;Traction;Moist Heat;Iontophoresis 50m/ml Dexamethasone;Electrical Stimulation;Functional mobility training;Neuromuscular re-education;Therapeutic exercise;Therapeutic  activities;Patient/family education;Manual techniques;Dry needling;Passive range of motion;Taping;Joint Manipulations;Spinal Manipulations    PT Next Visit Plan  Continue to release Right LB/QL and infraspinatus.       Patient will benefit from skilled therapeutic intervention in order to improve the following deficits and impairments:  Improper body mechanics, Pain, Postural dysfunction, Increased muscle spasms, Decreased activity tolerance, Decreased endurance, Decreased range of motion, Decreased strength, Impaired flexibility  Visit Diagnosis: 1. Cervicalgia   2. Chronic left shoulder pain   3. Chronic right shoulder pain        Problem List Patient Active Problem List   Diagnosis Date Noted  . IFG (impaired fasting glucose) 07/11/2014  . High triglycerides 07/11/2014  . Appendicitis, acute, with peritonitis 02/20/2013  . Twin pregnancy, delivered vaginally, current hospitalization 04/21/2012  . Multiparity 02/18/2012  . Short cervix, antepartum, twins 01/14/2012  . Twin pregnancy, antepartum 01/01/2012  . AMA (advanced maternal age) multigravida 35+ 01/01/2012  . Late prenatal care 01/01/2012  . High-risk pregnancy supervision 01/01/2012   JMadelyn FlavorsPT 05/10/2019, 12:30 PM  [facility] [address]Phone: [phone]  Fax:  [phone] Name: RDareen GutzwillerMRN: [id number] of Birth: 710-07-1976 PHYSICAL THERAPY DISCHARGE SUMMARY  Visits from Start of Care: 12  Current functional level related to goals / functional outcomes: See above   Remaining deficits: See above   Education / Equipment: Anatomy of condition POC, HEP, exercise form/rationale  Plan: Patient agrees to discharge.  Patient goals were partially met. Patient is being discharged due to financial reasons.  ?????     Jessica C. Hightower PT, DPT 07/04/19 5:09 PM

## 2019-06-05 ENCOUNTER — Other Ambulatory Visit: Payer: Self-pay

## 2019-06-05 ENCOUNTER — Ambulatory Visit: Payer: Self-pay | Attending: Nurse Practitioner

## 2019-06-16 ENCOUNTER — Encounter: Payer: Self-pay | Admitting: Nurse Practitioner

## 2019-06-16 ENCOUNTER — Ambulatory Visit: Payer: Self-pay | Attending: Nurse Practitioner | Admitting: Nurse Practitioner

## 2019-06-16 ENCOUNTER — Other Ambulatory Visit: Payer: Self-pay

## 2019-06-16 DIAGNOSIS — G8929 Other chronic pain: Secondary | ICD-10-CM

## 2019-06-16 DIAGNOSIS — M542 Cervicalgia: Secondary | ICD-10-CM

## 2019-06-16 DIAGNOSIS — M25511 Pain in right shoulder: Secondary | ICD-10-CM

## 2019-06-16 DIAGNOSIS — M25512 Pain in left shoulder: Secondary | ICD-10-CM

## 2019-06-16 NOTE — Progress Notes (Signed)
Virtual Visit via Telephone Note Due to national recommendations of social distancing due to [place] 19, telehealth visit is felt to be most appropriate for this patient at this time.  I discussed the limitations, risks, security and privacy concerns of performing an evaluation and management service by telephone and the availability of in person appointments. I also discussed with the patient that there may be a patient responsible charge related to this service. The patient expressed understanding and agreed to proceed.    I connected with London Pepper on 06/16/19  at   2:50 PM EDT  EDT by telephone and verified that I am speaking with the correct person using two identifiers.   Consent I discussed the limitations, risks, security and privacy concerns of performing an evaluation and management service by telephone and the availability of in person appointments. I also discussed with the patient that there may be a patient responsible charge related to this service. The patient expressed understanding and agreed to proceed.   Location of Patient: Private Residence   Location of Provider: [place] [facility] [place] [facility]    Persons participating in Telemedicine visit: Geryl Rankins FNP-BC [place]  Spanish Interpreter Mickel Baas [id number]   History of Present Illness: Telemedicine visit for: Cervicalgia  She has chronic neck and bilateral shoulder pain. We have tried her on tizanidine, flexeril, naproxen. Naproxen seems to have provided the most relief. She was referred to Physical therapy however she states it did not provide relief of her pain.   She endorses neck pain that radiates to the frontal scalp area. She sustained a work related injury 2 years ago while carrying a vacuum cleaner on her back and the vacuum cleaner cord got stuck in the moving elevator. She attempted to catch the cord however she was unsuccessful and ended up falling with the  weight of the vacuum cleaner around her shoulders and neck. Aggravating factor: Turning/rotation. Endorses a pulling sensation in her neck with rotation.  There is also associated bilateral shoulder stiffness. She denies any fevers, nausea, vomiting or sciatica.   Past Medical History:  Diagnosis Date  . Appendicitis, acute, with peritonitis 02/20/2013  . Medical history non-contributory   . Prediabetes     Past Surgical History:  Procedure Laterality Date  . APPENDECTOMY    . INTRAUTERINE DEVICE INSERTION  09/2012  . LAPAROSCOPIC APPENDECTOMY N/A 02/09/2013   Procedure: APPENDECTOMY LAPAROSCOPIC;  Surgeon: Rolm Bookbinder, MD;  Location: [facility] OR;  Service: General;  Laterality: N/A;    Family History  Problem Relation Age of Onset  . Asthma Mother     Social History   Socioeconomic History  . Marital status: Married    Spouse name: Not on file  . Number of children: Not on file  . Years of education: Not on file  . Highest education level: Not on file  Occupational History  . Not on file  Social Needs  . Financial resource strain: Not on file  . Food insecurity    Worry: Not on file    Inability: Not on file  . Transportation needs    Medical: Not on file    Non-medical: Not on file  Tobacco Use  . Smoking status: Never Smoker  . Smokeless tobacco: Never Used  Substance and Sexual Activity  . Alcohol use: No  . Drug use: No  . Sexual activity: Not Currently    Birth control/protection: I.U.D.  Lifestyle  . Physical activity    Days  per week: Not on file    Minutes per session: Not on file  . Stress: Not on file  Relationships  . Social Musicianconnections    Talks on phone: Not on file    Gets together: Not on file    Attends religious service: Not on file    Active member of club or organization: Not on file    Attends meetings of clubs or organizations: Not on file    Relationship status: Not on file  Other Topics Concern  . Not on file  Social History Narrative   . Not on file     Observations/Objective: Awake, alert and oriented x 3   Review of Systems  Constitutional: Negative for fever, malaise/fatigue and weight loss.  HENT: Negative.  Negative for nosebleeds.   Eyes: Negative.  Negative for blurred vision, double vision and photophobia.  Respiratory: Negative.  Negative for cough and shortness of breath.   Cardiovascular: Negative.  Negative for chest pain, palpitations and leg swelling.  Gastrointestinal: Negative.  Negative for heartburn, nausea and vomiting.  Musculoskeletal: Positive for joint pain, myalgias and neck pain.  Neurological: Negative.  Negative for dizziness, focal weakness, seizures and headaches.  Psychiatric/Behavioral: Negative.  Negative for suicidal ideas.    Assessment and Plan: Clotilde DieterRosa was seen today for follow-up.  Diagnoses and all orders for this visit:  Cervicalgia -     Ambulatory referral to Physical Medicine Rehab -     naproxen (NAPROSYN) 500 MG tablet; Take 1 tablet (500 mg total) by mouth 2 (two) times daily with a meal.  Chronic pain of both shoulders -     Ambulatory referral to Physical Medicine Rehab -     naproxen (NAPROSYN) 500 MG tablet; Take 1 tablet (500 mg total) by mouth 2 (two) times daily with a meal.     Follow Up Instructions Return in about 2 months (around 08/16/2019) for Physical.     I discussed the assessment and treatment plan with the patient. The patient was provided an opportunity to ask questions and all were answered. The patient agreed with the plan and demonstrated an understanding of the instructions.   The patient was advised to call back or seek an in-person evaluation if the symptoms worsen or if the condition fails to improve as anticipated.  I provided 18 minutes of non-face-to-face time during this encounter including median intraservice time, reviewing previous notes, labs, imaging, medications and explaining diagnosis and management.  Claiborne RiggZelda W Sharone Picchi,  FNP-BC

## 2019-06-17 ENCOUNTER — Encounter: Payer: Self-pay | Admitting: Nurse Practitioner

## 2019-06-17 MED ORDER — NAPROXEN 500 MG PO TABS: 500.0000 mg | ORAL_TABLET | Freq: Two times a day (BID) | ORAL | 1 refills | Status: DC

## 2019-06-19 MED FILL — ?NAPROXEN 500 MG TABET: 500 | 30 days supply | Qty: 60 | Fill #0

## 2019-07-19 ENCOUNTER — Encounter: Payer: Self-pay | Admitting: Physical Medicine and Rehabilitation

## 2019-07-25 ENCOUNTER — Encounter: Payer: Self-pay | Admitting: Nurse Practitioner

## 2019-07-25 ENCOUNTER — Ambulatory Visit (HOSPITAL_BASED_OUTPATIENT_CLINIC_OR_DEPARTMENT_OTHER): Payer: Self-pay | Admitting: Pharmacist

## 2019-07-25 ENCOUNTER — Ambulatory Visit: Payer: Self-pay | Attending: Nurse Practitioner | Admitting: Nurse Practitioner

## 2019-07-25 ENCOUNTER — Other Ambulatory Visit: Payer: Self-pay

## 2019-07-25 VITALS — BP 109/72 | HR 77 | Temp 98.5°F | Ht 62.0 in | Wt 155.4 lb

## 2019-07-25 DIAGNOSIS — M25512 Pain in left shoulder: Secondary | ICD-10-CM

## 2019-07-25 DIAGNOSIS — D72829 Elevated white blood cell count, unspecified: Secondary | ICD-10-CM

## 2019-07-25 DIAGNOSIS — M542 Cervicalgia: Secondary | ICD-10-CM

## 2019-07-25 DIAGNOSIS — G8929 Other chronic pain: Secondary | ICD-10-CM

## 2019-07-25 DIAGNOSIS — M25511 Pain in right shoulder: Secondary | ICD-10-CM

## 2019-07-25 DIAGNOSIS — Z23 Encounter for immunization: Secondary | ICD-10-CM

## 2019-07-25 DIAGNOSIS — E559 Vitamin D deficiency, unspecified: Secondary | ICD-10-CM

## 2019-07-25 DIAGNOSIS — Z0000 Encounter for general adult medical examination without abnormal findings: Secondary | ICD-10-CM

## 2019-07-25 DIAGNOSIS — R7303 Prediabetes: Secondary | ICD-10-CM

## 2019-07-25 LAB — POCT GLYCOSYLATED HEMOGLOBIN (HGB A1C): Hemoglobin A1C: 5.6 % (ref 4.0–5.6)

## 2019-07-25 LAB — GLUCOSE, POCT (MANUAL RESULT ENTRY): POC Glucose: 125 mg/dl — AB (ref 70–99)

## 2019-07-25 MED ORDER — METFORMIN HCL ER 500 MG PO TB24: 500.0000 mg | ORAL_TABLET | Freq: Every day | ORAL | 3 refills | Status: DC

## 2019-07-25 MED ORDER — NAPROXEN 500 MG PO TABS: 500.0000 mg | ORAL_TABLET | Freq: Two times a day (BID) | ORAL | 1 refills | Status: DC

## 2019-07-25 MED ORDER — CYCLOBENZAPRINE HCL 5 MG PO TABS: 5.0000 mg | ORAL_TABLET | Freq: Three times a day (TID) | ORAL | 1 refills | Status: DC | PRN

## 2019-07-25 MED FILL — METFORMIN HCL ER 500 MG TB2: 500 | 30 days supply | Qty: 30 | Fill #0

## 2019-07-25 MED FILL — CYCLOBENZAPRINE 5 MG TABLET: 5 | 20 days supply | Qty: 60 | Fill #0

## 2019-07-25 MED FILL — ?NAPROXEN 500 MG TABS: 500 | 30 days supply | Qty: 60 | Fill #0

## 2019-07-25 NOTE — Progress Notes (Signed)
Assessment & Plan:  Carrie Buchanan was seen today for annual exam.  Diagnoses and all orders for this visit:  Encounter for annual physical exam Referred to [facility] today  Prediabetes -     Glucose (CBG) -     HgB A1c -     Lipid panel -     metFORMIN (GLUCOPHAGE XR) 500 MG 24 hr tablet; Take 1 tablet (500 mg total) by mouth daily with breakfast. Continue blood sugar control as discussed in office today, low carbohydrate diet, and regular physical exercise as tolerated, 150 minutes per week (30 min each day, 5 days per week, or 50 min 3 days per week). Keep blood sugar logs with fasting goal of 90-130 mg/dl, post prandial (after you eat) less than 180.  For Hypoglycemia: BS <60 and Hyperglycemia BS >400; contact the clinic ASAP.   Leukocytosis, unspecified type -     CBC with Differential  Cervicalgia -     cyclobenzaprine (FLEXERIL) 5 MG tablet; Take 1 tablet (5 mg total) by mouth 3 (three) times daily as needed for muscle spasms. Patient will pick up 05-04-2019 -     naproxen (NAPROSYN) 500 MG tablet; Take 1 tablet (500 mg total) by mouth 2 (two) times daily with a meal. Continue OP Rehab.   Chronic pain of both shoulders -     naproxen (NAPROSYN) 500 MG tablet; Take 1 tablet (500 mg total) by mouth 2 (two) times daily with a meal.  Vitamin D deficiency disease -     VITAMIN D 25 Hydroxy (Vit-D Deficiency, Fractures)    Patient has been counseled on age-appropriate routine health concerns for screening and prevention. These are reviewed and up-to-date. Referrals have been placed accordingly. Immunizations are up-to-date or declined.    Subjective:   Chief Complaint  Patient presents with  . Annual Exam   HPI Carrie Buchanan 43 y.o. female presents to office today for annual physical.   Prediabetes Highest A1c 5.9. Currently taking metformin XR 500 mg daily as prescribed. Weight is stable.  Lab Results  Component Value Date   HGBA1C 5.6 07/25/2019    Review of Systems   Constitutional: Negative.  Negative for chills, fever, malaise/fatigue and weight loss.  HENT: Negative.  Negative for congestion, hearing loss, sinus pain and sore throat.   Eyes: Negative.  Negative for blurred vision, double vision, photophobia and pain.  Respiratory: Negative.  Negative for cough, sputum production, shortness of breath and wheezing.   Cardiovascular: Negative.  Negative for chest pain and leg swelling.  Gastrointestinal: Negative.  Negative for abdominal pain, constipation, diarrhea, heartburn, nausea and vomiting.  Genitourinary: Negative.   Musculoskeletal: Positive for joint pain, myalgias and neck pain.  Skin: Negative.  Negative for rash.  Neurological: Negative.  Negative for dizziness, tremors, speech change, focal weakness, seizures and headaches.  Endo/Heme/Allergies: Negative.  Negative for environmental allergies.  Psychiatric/Behavioral: Negative.  Negative for depression and suicidal ideas. The patient is not nervous/anxious and does not have insomnia.     Past Medical History:  Diagnosis Date  . Appendicitis, acute, with peritonitis 02/20/2013  . Medical history non-contributory   . Prediabetes     Past Surgical History:  Procedure Laterality Date  . APPENDECTOMY    . INTRAUTERINE DEVICE INSERTION  09/2012  . LAPAROSCOPIC APPENDECTOMY N/A 02/09/2013   Procedure: APPENDECTOMY LAPAROSCOPIC;  Surgeon: Emelia Loron, MD;  Location: [facility] OR;  Service: General;  Laterality: N/A;    Family History  Problem Relation Age of Onset  .  Asthma Mother     Social History Reviewed with no changes to be made today.   Outpatient Medications Prior to Visit  Medication Sig Dispense Refill  . Cholecalciferol (VITAMIN D3) 2000 units CHEW Chew by mouth.    . cyclobenzaprine (FLEXERIL) 5 MG tablet Take 1 tablet (5 mg total) by mouth 3 (three) times daily as needed for muscle spasms. Patient will pick up 05-04-2019 60 tablet 1  . metFORMIN (GLUCOPHAGE XR) 500 MG 24 hr  tablet Take 1 tablet (500 mg total) by mouth daily with breakfast. 90 tablet 3  . naproxen (NAPROSYN) 500 MG tablet Take 1 tablet (500 mg total) by mouth 2 (two) times daily with a meal. 60 tablet 1   No facility-administered medications prior to visit.     No Known Allergies     Objective:    BP 109/72 (BP Location: Left Arm, Patient Position: Sitting, Cuff Size: Normal)   Pulse 77   Temp 98.5 F (36.9 C) (Oral)   Ht 5\' 2"  (1.575 m)   Wt 155 lb 6.4 oz (70.5 kg)   SpO2 (!) 77%   BMI 28.42 kg/m  Wt Readings from Last 3 Encounters:  07/25/19 155 lb 6.4 oz (70.5 kg)  04/21/19 155 lb (70.3 kg)  01/10/19 152 lb (68.9 kg)    Physical Exam Constitutional:      Appearance: She is well-developed.  HENT:     Head: Normocephalic and atraumatic.     Right Ear: External ear normal.     Left Ear: External ear normal.     Nose: Nose normal.     Mouth/Throat:     Pharynx: No oropharyngeal exudate.  Eyes:     General: No scleral icterus.       Right eye: No discharge.     Extraocular Movements: Extraocular movements intact.     Conjunctiva/sclera: Conjunctivae normal.     Pupils: Pupils are equal, round, and reactive to light.   Neck:     Musculoskeletal: Neck supple. Decreased range of motion. Pain with movement and muscular tenderness present. No edema, erythema or spinous process tenderness.     Thyroid: No thyromegaly.     Trachea: No tracheal deviation.  Cardiovascular:     Rate and Rhythm: Normal rate and regular rhythm.     Pulses:          Dorsalis pedis pulses are 2+ on the right side and 2+ on the left side.       Posterior tibial pulses are 2+ on the right side and 2+ on the left side.     Heart sounds: Normal heart sounds. No murmur. No friction rub.  Pulmonary:     Effort: Pulmonary effort is normal. No accessory muscle usage or respiratory distress.     Breath sounds: Normal breath sounds. No decreased breath sounds, wheezing, rhonchi or rales.  Chest:     Chest  wall: No tenderness.     Breasts: Breasts are symmetrical.        Right: No inverted nipple, mass, nipple discharge, skin change or tenderness.        Left: No inverted nipple, mass, nipple discharge, skin change or tenderness.  Abdominal:     General: Bowel sounds are normal. There is no distension.     Palpations: Abdomen is soft. There is no mass.     Tenderness: There is no abdominal tenderness. There is no guarding or rebound.  Musculoskeletal:        General: No  tenderness or deformity.     Right shoulder: She exhibits decreased range of motion and pain.     Comments: Pain elicited with passive abduction of right arm for breast exam.   Lymphadenopathy:     Cervical: No cervical adenopathy.     Upper Body:     Right upper body: No supraclavicular, axillary or pectoral adenopathy.     Left upper body: No supraclavicular, axillary or pectoral adenopathy.  Skin:    General: Skin is warm and dry.     Findings: No erythema.  Neurological:     Mental Status: She is alert and oriented to person, place, and time.     Cranial Nerves: Cranial nerves are intact. No cranial nerve deficit.     Sensory: Sensation is intact.     Motor: No atrophy or abnormal muscle tone.     Coordination: Coordination is intact. Coordination normal. Finger-Nose-Finger Test normal.     Gait: Gait is intact.     Deep Tendon Reflexes: Reflexes are normal and symmetric.     Reflex Scores:      Patellar reflexes are 2+ on the right side and 2+ on the left side. Psychiatric:        Speech: Speech normal.        Behavior: Behavior normal.        Thought Content: Thought content normal.        Judgment: Judgment normal.        Patient has been counseled extensively about nutrition and exercise as well as the importance of adherence with medications and regular follow-up. The patient was given clear instructions to go to ER or return to medical center if symptoms don't improve, worsen or new problems develop. The  patient verbalized understanding.   Follow-up: Return in about 6 months (around 01/22/2020).   Claiborne Rigg, FNP-BC [facility] and [facility] [place], [place] [phone]   07/25/2019, 9:10 AM

## 2019-07-25 NOTE — Patient Instructions (Signed)
Radiculopata cervical Cervical Radiculopathy  La radiculopata cervical quiere decir que un nervio del cuello (nervio cervical) est comprimido o daado. Esto puede ocurrir debido a una lesin en la columna vertebral cervical (vrtebras) del cuello, o como parte normal del envejecimiento. Esto puede causar dolor o prdida de la sensibilidad (adormecimiento) que comienza en el cuello y va hasta el brazo y los dedos. Con frecuencia, esta afeccin mejora con reposo. Tal vez sea necesario administrar un tratamiento si la afeccin no mejora. Cules son las causas?  Lesin en el cuello.  Abombamiento discal en la columna vertebral.  Movimientos musculares que no puede controlar (espasmos musculares).  Tensin de los msculos del cuello debido al uso excesivo.  Artritis.  Fractura de los huesos y las articulaciones de la columna (espondiloartrosis) debido al envejecimiento.  Espolones seos que se forman cerca de los nervios del cuello. Cules son los signos o los sntomas?  Dolor. El dolor puede [employer] las siguientes caractersticas: ? Va desde el cuello hasta el brazo y [place]. ? Es muy intenso o irritante. ? Empeora al mover el cuello.  Prdida de la sensibilidad o adormecimiento en el brazo o la mano.  Debilidad en el brazo o la [place], en casos muy graves. Cmo se trata? En muchos casos, no se requiere tratamiento para esta afeccin. Con reposo, esta generalmente mejora con el tiempo. Si es Arts development officer, las opciones pueden incluir lo siguiente:  El uso de un collarn cervical blando durante perodos cortos, como se lo haya indicado el mdico.  Optometrist ejercicios (fisioterapia) para Software engineer los msculos del cuello.  Tomar medicamentos.  Aplicarse inyecciones en la columna vertebral, en casos muy graves.  Someterse a [place]. Esto puede ser necesario si otros tratamientos no son eficaces. El tipo de [place] que se realiza depende de la causa de Office manager. Siga estas instrucciones en su casa: Si tiene un collarn cervical blando:  selo como se lo haya indicado el mdico. Quteselo solamente como se lo haya indicado el mdico.  Pregntele al mdico si puede quitarse el collarn para baarse o higienizarse. Si lo autorizan a Programmer, systems para baarse o higienizarse: ? Siga las instrucciones del mdico sobre cmo quitarse el collarn de forma segura. ? Para limpiar el collarn, psele un pao con agua y [place], y squelo bien. ? Retire las almohadillas desmontables del collarn, si las [place], [place]. Lvelas a mano con agua y [place]. Djelas que se sequen por completo antes de volver a ponerlas en el collarn. ? Contrlese la piel debajo del collarn para ver si hay enrojecimiento o llagas. Avsele al mdico si ve algo de lo anterior. Control del [employer] medicamentos de venta libre y los recetados solamente como se lo haya indicado el mdico.  Si se lo indican, aplique hielo sobre la zona dolorida. ? Si tiene un collarn cervical blando, quteselo como se lo haya indicado el mdico. ? Ponga el hielo en una bolsa plstica. ? Coloque una [employer] piel y Therapist, nutritional. ? Coloque el hielo durante 38minutos, 2 a 3veces por da.  Si aplicarse hielo no le Ship broker, intente Multimedia programmer. Use la fuente de calor que el mdico le recomiende, como una compresa de calor hmedo o una almohadilla trmica. ? Coloque una [employer] piel y la fuente de Freight forwarder. ? Aplique calor durante 20 a 56minutos. ? Retire la fuente de calor si la piel  se pone de color rojo brillante. Esto es muy importante si no puede Financial risk analyst, calor o fro. Puede correr un riesgo mayor de sufrir quemaduras.  Puede intentar con un masaje suave en el cuello y los hombros. Actividad  Descanse todo lo que sea necesario.  Retome sus actividades normales segn lo indicado por el mdico. Pregntele al mdico qu actividades  son seguras para usted.  Haga ejercicios como se lo hayan indicado el mdico o el fisioterapeuta.  No levante nada que pese ms de 10lb (4.5kg) hasta que el mdico le diga que puede hacerlo sin correr Herbalist. Instrucciones generales  Use una almohada plana para dormir.  No conduzca vehculos mientras [place] el collarn cervical blando. Si no tiene un collarn cervical blando, pregntele al mdico si puede conducir sin correr [employer] se cura su cuello.  Pregntele al mdico si el medicamento recetado le impide conducir o usar maquinaria pesada.  No consuma ningn producto que contenga nicotina o tabaco, como cigarrillos, cigarrillos electrnicos y tabaco de Theatre manager. Estos pueden retrasar la recuperacin. Si necesita ayuda para dejar de fumar, consulte al mdico.  Concurra a todas las visitas de seguimiento como se lo haya indicado el mdico. Esto es importante. Comunquese con un mdico si:  La afeccin no mejora con el tratamiento. Solicite ayuda inmediatamente si:  El dolor empeora y no se alivia con medicamentos.  Pierde la sensibilidad o siente debilidad en la mano, el brazo, el rostro o la pierna.  Tiene fiebre alta.  Presenta rigidez en el cuello.  Nopuede controlar la evacuacin de materia fecal o de pis (tiene incontinencia).  Tiene dificultad para caminar, mantener el equilibrio o hablar. Resumen  La radiculopata cervical quiere decir que un nervio del cuello est comprimido o daado.  Un nervio puede pinzarse por un abultamiento de disco, artritis, una lesin en el cuello u otras causas.  Los sntomas Environmental education officer, hormigueo o prdida de la sensibilidad que va desde el cuello hasta el brazo o la [place].  En casos muy graves, puede presentarse debilidad en el brazo o la [place].  El tratamiento puede incluir reposo, usar un collarn cervical blando y Radio producer ejercicios. Es posible que necesite tomar medicamentos para Chief Technology Officer. En casos muy graves, podra ser  necesario aplicarse inyecciones o someterse a [place]. Esta informacin no tiene Theme park manager el consejo del mdico. Asegrese de hacerle al mdico cualquier pregunta que tenga. Document Released: 10/08/2011 Document Revised: 11/07/2018 Document Reviewed: 11/07/2018 Elsevier Patient Education  2020 [employer].

## 2019-07-25 NOTE — Progress Notes (Signed)
Patient presents for vaccination against influenza per orders of Zelda. Consent given. Counseling provided. No contraindications exists. Vaccine administered without incident.   

## 2019-07-26 ENCOUNTER — Other Ambulatory Visit: Payer: Self-pay | Admitting: Nurse Practitioner

## 2019-07-26 LAB — CBC WITH DIFFERENTIAL/PLATELET
Basophils Absolute: 0.1 10*3/uL (ref 0.0–0.2)
Basos: 1 %
EOS (ABSOLUTE): 0.3 10*3/uL (ref 0.0–0.4)
Eos: 4 %
Hematocrit: 38.7 % (ref 34.0–46.6)
Hemoglobin: 13.1 g/dL (ref 11.1–15.9)
Immature Grans (Abs): 0 10*3/uL (ref 0.0–0.1)
Immature Granulocytes: 0 %
Lymphocytes Absolute: 2.4 10*3/uL (ref 0.7–3.1)
Lymphs: 32 %
MCH: 31 pg (ref 26.6–33.0)
MCHC: 33.9 g/dL (ref 31.5–35.7)
MCV: 92 fL (ref 79–97)
Monocytes Absolute: 0.5 10*3/uL (ref 0.1–0.9)
Monocytes: 7 %
Neutrophils Absolute: 4.4 10*3/uL (ref 1.4–7.0)
Neutrophils: 56 %
Platelets: 356 10*3/uL (ref 150–450)
RBC: 4.22 x10E6/uL (ref 3.77–5.28)
RDW: 13.4 % (ref 11.7–15.4)
WBC: 7.6 10*3/uL (ref 3.4–10.8)

## 2019-07-26 LAB — LIPID PANEL
Chol/HDL Ratio: 4 ratio (ref 0.0–4.4)
Cholesterol, Total: 208 mg/dL — ABNORMAL HIGH (ref 100–199)
HDL: 52 mg/dL (ref >39)
LDL Chol Calc (NIH): 135 mg/dL — ABNORMAL HIGH (ref 0–99)
Triglycerides: 120 mg/dL (ref 0–149)
VLDL Cholesterol Cal: 21 mg/dL (ref 5–40)

## 2019-07-26 LAB — VITAMIN D 25 HYDROXY (VIT D DEFICIENCY, FRACTURES): Vit D, 25-Hydroxy: 27.3 ng/mL — ABNORMAL LOW (ref 30.0–100.0)

## 2019-07-26 MED ORDER — ATORVASTATIN CALCIUM 20 MG PO TABS: 20.0000 mg | ORAL_TABLET | Freq: Every day | ORAL | 3 refills | Status: DC

## 2019-07-27 MED FILL — ?ATORVASTATIN 20 MG TABLET: 20 | 30 days supply | Qty: 30 | Fill #0

## 2019-08-02 ENCOUNTER — Encounter: Payer: Self-pay | Admitting: Physical Medicine and Rehabilitation

## 2019-08-02 ENCOUNTER — Other Ambulatory Visit: Payer: Self-pay

## 2019-08-02 ENCOUNTER — Encounter
Payer: No Typology Code available for payment source | Attending: Physical Medicine and Rehabilitation | Admitting: Physical Medicine and Rehabilitation

## 2019-08-02 VITALS — BP 124/78 | HR 9 | Temp 97.5°F | Ht 62.0 in | Wt 154.0 lb

## 2019-08-02 DIAGNOSIS — M7541 Impingement syndrome of right shoulder: Secondary | ICD-10-CM | POA: Insufficient documentation

## 2019-08-02 DIAGNOSIS — M7918 Myalgia, other site: Secondary | ICD-10-CM | POA: Insufficient documentation

## 2019-08-02 NOTE — Progress Notes (Signed)
Subjective:    Patient ID: Carrie Buchanan, female    DOB: [birth year], 43 y.o.   MRN: [id number]  HPI   CC: upper back/neck pain  Pt is a 43 yr old spanish speaking L handed female with preDM and HLD, here for evaluation of Neck/shoulder pain.  Got stuck in an elevator 2 years ago with a pack on and doors closed on her and wrench neck/upper back.  Imaging done shows "nothing".  Pain is some type of pain, that's still there. Like pressure pain.  If she takes some type of pain meds, will go away for 2+ hours, then comes back. There's days that pain is less and days that's it's more. If she lifts above head, the R shoulder/arm, feels pain as the arm comes down. When turns head side to side, makes it worse.  If points to 1-2 spots, feels in R shoulder and posterior neck. No MRI of R shoulder or neck has been done- only CT of cervical spine and xray of neck and r wrist- no imaging of shoulder at all.  Naprosyn 500 mg and a muscle relaxant- Flexeril 5 mg. So, both have helped, but Flexeril not as much; but Naprosyn helps for 2-3 hours max.  Pain runs down to the hands on B/L sides- R side worse than L side.  Takes naprosyn maybe 1x/day- doesn't want to take pills a lot- also, never had injections.  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 Not a smoker of any kind.    Pain Inventory Average Pain 9 Pain Right Now 9 My pain is sharp and burning  In the last 24 hours, has pain interfered with the following? General activity 5 Relation with others 0 Enjoyment of life 0 What TIME of day is your pain at its worst? night Sleep (in general) Fair  Pain is worse with: sitting Pain improves with: medication Relief from Meds: 2  Mobility do you drive?  yes  Function not employed: date last employed .  Neuro/Psych numbness  Prior Studies Any changes since last visit?  no  Physicians involved in your care Any changes since  last visit?  no   Family History  Problem Relation Age of Onset  . Asthma Mother    Social History   Socioeconomic History  . Marital status: Married    Spouse name: Not on file  . Number of children: Not on file  . Years of education: Not on file  . Highest education level: Not on file  Occupational History  . Not on file  Social Needs  . Financial resource strain: Not on file  . Food insecurity    Worry: Not on file    Inability: Not on file  . Transportation needs    Medical: Not on file    Non-medical: Not on file  Tobacco Use  . Smoking status: Never Smoker  . Smokeless tobacco: Never Used  Substance and Sexual Activity  . Alcohol use: No  . Drug use: No  . Sexual activity: Not Currently    Birth control/protection: I.U.D.  Lifestyle  . Physical activity    Days per week: Not on file    Minutes per session: Not on file  . Stress: Not on file  Relationships  . Social Herbalist on phone: Not on file    Gets together: Not on file    Attends religious service: Not on file    Active member  of club or organization: Not on file    Attends meetings of clubs or organizations: Not on file    Relationship status: Not on file  Other Topics Concern  . Not on file  Social History Narrative  . Not on file   Past Surgical History:  Procedure Laterality Date  . APPENDECTOMY    . INTRAUTERINE DEVICE INSERTION  09/2012  . LAPAROSCOPIC APPENDECTOMY N/A 02/09/2013   Procedure: APPENDECTOMY LAPAROSCOPIC;  Surgeon: Emelia Loron, MD;  Location: [facility] OR;  Service: General;  Laterality: N/A;   Past Medical History:  Diagnosis Date  . Appendicitis, acute, with peritonitis 02/20/2013  . Medical history non-contributory   . Prediabetes    BP 124/78   Pulse (!) 9   Temp (!) 97.5 F (36.4 C)   Ht 5\' 2"  (1.575 m)   Wt 154 lb (69.9 kg)   SpO2 98%   BMI 28.17 kg/m   Opioid Risk Score:   Fall Risk Score:  `1  Depression screen PHQ 2/9  Depression screen  [facility] 2/9 01/10/2019 11/21/2018 10/05/2018 05/11/2018 04/13/2018 01/12/2018 04/08/2017  Decreased Interest 2 2 2  - 2 0 2  Down, Depressed, Hopeless 2 2 1  0 1 0 1  PHQ - 2 Score 4 4 3  0 3 0 3  Altered sleeping 2 2 2 1  0 1 0  Tired, decreased energy 1 2 1 1 1 1 1   Change in appetite 0 0 0 0 0 0 0  Feeling bad or failure about yourself  2 2 1  - 0 0 0  Trouble concentrating - 2 0 1 1 1 1   Moving slowly or fidgety/restless 3 1 1  0 0 0 0  Suicidal thoughts 0 0 0 0 0 0 0  PHQ-9 Score 12 13 8 3 5 3 5   Some recent data might be hidden     Review of Systems  Constitutional: Negative.   HENT: Negative.   Eyes: Negative.   Respiratory: Negative.   Cardiovascular: Negative.   Gastrointestinal: Negative.   Endocrine: Negative.   Genitourinary: Negative.   Musculoskeletal: Positive for arthralgias, back pain, myalgias and neck pain.  Skin: Negative.   Allergic/Immunologic: Negative.   Neurological: Negative.   Hematological: Negative.   Psychiatric/Behavioral: Negative.   All other systems reviewed and are negative.      Objective:   Physical Exam  Awake, alert, appropriate,e in gown, accompanied by interpretor, NAD Impingement syndrome of R shoulder- empty can test (+) for impingement of R shoulder, no partial tear found on empty can test TTP over RTC, but not biceps tendinitis Has significant trigger points of R>L scalenes, upper traps and levators as well as pecs R>L Strength 5/5 in UEs B/L      Assessment & Plan:    I spent a total of 35 minutes on appointment-  Patient is a 43 yr old L handed female with myofascial pain due to trigger points as well as R shoulder impingement syndrome.   1. Can continue Naprosyn -take 2x/day until injections.  2. Will schedule a follow up in next 1-2 weeks to do Right shoulder steroid injection  3. Will also do trigger point injections at that visit as well, for muscle pain.  4. Get theracane - amazon, etc online or medical supply shops- and hold  pressure 2-4 minutes on each trigger point/muscle tight spot.   5. F/U in 1-2 weeks for injections- steroid and trigger point injections. Once I do injections, can go back to exercises, will have  better resultsmore than 20 minutes educating patient on trigger point injections, trigger points, and shoulder impingement syndrome.

## 2019-08-02 NOTE — Patient Instructions (Signed)
  Patient is a 43 yr old L handed female with myofascial pain due to trigger points as well as R shoulder impingement syndrome.   1. Can continue Naprosyn -take 2x/day until injections.  2. Will schedule a follow up in next 1-2 weeks to do Right shoulder steroid injection  3. Will also do trigger point injections at that visit as well, for muscle pain.  4. [place], etc online or medical supply shops- and hold pressure 2-4 minutes on each trigger point/muscle tight spot.   5. F/U in 1-2 weeks for injections- steroid and trigger point injections. Once I do injections, can go back to exercises, will have better results

## 2019-08-03 ENCOUNTER — Other Ambulatory Visit: Payer: Self-pay | Admitting: Nurse Practitioner

## 2019-08-03 DIAGNOSIS — Z1231 Encounter for screening mammogram for malignant neoplasm of breast: Secondary | ICD-10-CM

## 2019-08-16 ENCOUNTER — Encounter: Payer: No Typology Code available for payment source | Admitting: Physical Medicine and Rehabilitation

## 2019-08-22 ENCOUNTER — Encounter: Payer: No Typology Code available for payment source | Admitting: Physical Medicine and Rehabilitation

## 2019-09-06 ENCOUNTER — Other Ambulatory Visit: Payer: Self-pay

## 2019-09-06 ENCOUNTER — Encounter: Payer: Self-pay | Admitting: Physical Medicine and Rehabilitation

## 2019-09-06 ENCOUNTER — Encounter
Payer: No Typology Code available for payment source | Attending: Physical Medicine and Rehabilitation | Admitting: Physical Medicine and Rehabilitation

## 2019-09-06 VITALS — BP 129/85 | HR 86 | Temp 98.8°F | Ht 62.0 in | Wt 158.0 lb

## 2019-09-06 DIAGNOSIS — M7918 Myalgia, other site: Secondary | ICD-10-CM | POA: Insufficient documentation

## 2019-09-06 DIAGNOSIS — M7541 Impingement syndrome of right shoulder: Secondary | ICD-10-CM

## 2019-09-06 DIAGNOSIS — M7542 Impingement syndrome of left shoulder: Secondary | ICD-10-CM

## 2019-09-06 NOTE — Progress Notes (Signed)
Patient here for trigger point injections for  Consent done and on chart.  Cleaned areas with alcohol and injected using a 27 gauge 1.5 inch needle  Injected  Using 1% Lidocaine with no EPI  Upper traps B/L Levators B/L Posterior scalenes B/L Middle scalenes Splenius Capitus B/L Pectoralis Major B/L Rhomboids B/L Infraspinatus Teres Major/minor B/L Thoracic paraspinals Lumbar paraspinals Other injections-    Patient's level of pain prior was 7/10 Current level of pain after injections is 5/10  There was no bleeding or complications.  Patient was advised to drink a lot of water on day after injections to flush system Will have increased soreness for 12-48 hours after injections.  Can use Lidocaine patches the day AFTER injections Can use theracane on day of injections in places didn't inject Can use heating pad 4-6 hours AFTER injections  Injected R posterior/Rotator cuff on R- cleaned with betadine x3- allowed to dry- not allergic to meds/iodine. Injected with 27 gauge 1.5 inch needle- into R posterior shoulder- no bleeding or complications- Went over timing of when works- start feeling better in 15 minutes- that's lidocaine- then Steroid kicks in tomorrow 11am Will last 2-3 months- can get shoulder injection again 3 months. Trigger point injections can redo in 6 weeks.

## 2019-09-06 NOTE — Patient Instructions (Signed)
Will have increased soreness for 12-48 hours after injections.  Can use Lidocaine patches the day AFTER injections Can use theracane on day of injections in places didn't inject Can use heating pad 4-6 hours AFTER injections  Injected R posterior/Rotator cuff on R- cleaned with betadine x3- allowed to dry- not allergic to meds/iodine. Injected with 27 gauge 1.5 inch needle- into R posterior shoulder- no bleeding or complications- Went over timing of when works- start feeling better in 15 minutes- that's lidocaine- then Steroid kicks in tomorrow 11am Will last 2-3 months- can get shoulder injection again 3 months. Trigger point injections can redo in 6 weeks.

## 2019-09-20 ENCOUNTER — Other Ambulatory Visit: Payer: Self-pay

## 2019-09-20 ENCOUNTER — Ambulatory Visit
Admission: RE | Admit: 2019-09-20 | Discharge: 2019-09-20 | Disposition: A | Discharge status: Home / Self Care | Payer: No Typology Code available for payment source | Source: Ambulatory Visit | Attending: Nurse Practitioner | Admitting: Nurse Practitioner

## 2019-09-20 DIAGNOSIS — Z1231 Encounter for screening mammogram for malignant neoplasm of breast: Secondary | ICD-10-CM

## 2019-10-18 ENCOUNTER — Ambulatory Visit: Payer: No Typology Code available for payment source | Admitting: Physical Medicine and Rehabilitation

## 2019-10-25 ENCOUNTER — Other Ambulatory Visit: Payer: Self-pay

## 2019-10-25 ENCOUNTER — Encounter
Payer: No Typology Code available for payment source | Attending: Physical Medicine and Rehabilitation | Admitting: Physical Medicine and Rehabilitation

## 2019-10-25 ENCOUNTER — Encounter: Payer: Self-pay | Admitting: Physical Medicine and Rehabilitation

## 2019-10-25 VITALS — BP 115/77 | HR 82 | Temp 97.7°F | Ht 60.0 in | Wt 159.0 lb

## 2019-10-25 DIAGNOSIS — M7918 Myalgia, other site: Secondary | ICD-10-CM | POA: Insufficient documentation

## 2019-10-25 DIAGNOSIS — M7541 Impingement syndrome of right shoulder: Secondary | ICD-10-CM | POA: Insufficient documentation

## 2019-10-25 NOTE — Patient Instructions (Signed)
1. Can do R shoulder RC injection again as of 12/08/2019  2. Needs to get tennis balls or Theracane and use 2-4 minutes- holding pressure on tight muscles. Needs to do ~3x/week- or more frequently.  3. Pt wants to come back next week to do trigger point injections.   4. F/U next week for injections

## 2019-10-25 NOTE — Progress Notes (Signed)
Subjective:    Patient ID: Carrie Buchanan, female    DOB: May 30, 1976, 43 y.o.   MRN: [id number]  HPI  Took ~ 1 week for injections to "kick in" but started to feel better-  Lasted ~ 2 to 2.5 weeks- then pain started to come back.  With daily activities.   Also injected the R shoulder- helped R shoulder- still working, pain still doing better.   Wants to do more injections.   Didn't get theracane, but was getting a massage- every 7-8 days.       Pain Inventory Average Pain 3 Pain Right Now 3 My pain is aching  In the last 24 hours, has pain interfered with the following? General activity 7 Relation with others 0 Enjoyment of life 5 What TIME of day is your pain at its worst? morning, night Sleep (in general) Good  Pain is worse with: some activites Pain improves with: medication Relief from Meds: 4  Mobility walk without assistance ability to climb steps?  yes do you drive?  yes  Function not employed: date last employed .  Neuro/Psych No problems in this area  Prior Studies Any changes since last visit?  no  Physicians involved in your care Any changes since last visit?  no   Family History  Problem Relation Age of Onset  . Asthma Mother    Social History   Socioeconomic History  . Marital status: Married    Spouse name: Not on file  . Number of children: Not on file  . Years of education: Not on file  . Highest education level: Not on file  Occupational History  . Not on file  Tobacco Use  . Smoking status: Never Smoker  . Smokeless tobacco: Never Used  Substance and Sexual Activity  . Alcohol use: No  . Drug use: No  . Sexual activity: Not Currently    Birth control/protection: I.U.D.  Other Topics Concern  . Not on file  Social History Narrative  . Not on file   Social Determinants of Health   Financial Resource Strain:   . Difficulty of Paying Living Expenses: Not on file  Food Insecurity:   . Worried About  Charity fundraiser in the Last Year: Not on file  . Ran Out of Food in the Last Year: Not on file  Transportation Needs:   . Lack of Transportation (Medical): Not on file  . Lack of Transportation (Non-Medical): Not on file  Physical Activity:   . Days of Exercise per Week: Not on file  . Minutes of Exercise per Session: Not on file  Stress:   . Feeling of Stress : Not on file  Social Connections:   . Frequency of Communication with Friends and Family: Not on file  . Frequency of Social Gatherings with Friends and Family: Not on file  . Attends Religious Services: Not on file  . Active Member of Clubs or Organizations: Not on file  . Attends Archivist Meetings: Not on file  . Marital Status: Not on file   Past Surgical History:  Procedure Laterality Date  . APPENDECTOMY    . INTRAUTERINE DEVICE INSERTION  09/2012  . LAPAROSCOPIC APPENDECTOMY N/A 02/09/2013   Procedure: APPENDECTOMY LAPAROSCOPIC;  Surgeon: Rolm Bookbinder, MD;  Location: [place];  Service: General;  Laterality: N/A;   Past Medical History:  Diagnosis Date  . Appendicitis, acute, with peritonitis 02/20/2013  . Medical history non-contributory   . Prediabetes  BP 115/77   Pulse 82   Temp 97.7 F (36.5 C)   Ht 5' (1.524 m)   Wt 159 lb (72.1 kg)   SpO2 98%   BMI 31.05 kg/m   Opioid Risk Score:   Fall Risk Score:  `1  Depression screen PHQ 2/9  Depression screen [facility] 2/9 09/06/2019 01/10/2019 11/21/2018 10/05/2018 05/11/2018 04/13/2018 01/12/2018  Decreased Interest 0 2 2 2  - 2 0  Down, Depressed, Hopeless 0 2 2 1  0 1 0  PHQ - 2 Score 0 4 4 3  0 3 0  Altered sleeping - 2 2 2 1  0 1  Tired, decreased energy - 1 2 1 1 1 1   Change in appetite - 0 0 0 0 0 0  Feeling bad or failure about yourself  - 2 2 1  - 0 0  Trouble concentrating - - 2 0 1 1 1   Moving slowly or fidgety/restless - 3 1 1  0 0 0  Suicidal thoughts - 0 0 0 0 0 0  PHQ-9 Score - 12 13 8 3 5 3   Some recent data might be hidden     Review of Systems  Constitutional: Negative.   HENT: Negative.   Eyes: Negative.   Respiratory: Negative.   Cardiovascular: Negative.   Gastrointestinal: Negative.   Endocrine: Negative.   Genitourinary: Negative.   Musculoskeletal: Positive for arthralgias.  Skin: Negative.   Allergic/Immunologic: Negative.   Neurological: Negative.   Psychiatric/Behavioral: Negative.   All other systems reviewed and are negative.      Objective:   Physical Exam  Awake, alert, appropriate, accompanied by translator. NAD Tight in upper traps B/L levators, but less tight in scalenes and pecs B/L- less tight in rhomboids as well- also TTP over mid triceps B/L L>R      Assessment & Plan:   1. Can do R shoulder RC injection again as of 12/08/2019  2. Needs to get tennis balls or Theracane and use 2-4 minutes- holding pressure on tight muscles. Needs to do ~3x/week- or more frequently.  3. Pt wants to come back next week to do trigger point injections.   4. F/U next week for injections

## 2019-11-27 ENCOUNTER — Other Ambulatory Visit: Payer: Self-pay

## 2019-11-27 ENCOUNTER — Encounter: Payer: Self-pay | Admitting: Physical Medicine and Rehabilitation

## 2019-11-27 ENCOUNTER — Encounter: Payer: Self-pay | Attending: Physical Medicine and Rehabilitation | Admitting: Physical Medicine and Rehabilitation

## 2019-11-27 VITALS — BP 117/76 | HR 92 | Temp 98.1°F | Ht 60.0 in | Wt 159.0 lb

## 2019-11-27 DIAGNOSIS — M7918 Myalgia, other site: Secondary | ICD-10-CM | POA: Insufficient documentation

## 2019-11-27 DIAGNOSIS — M7541 Impingement syndrome of right shoulder: Secondary | ICD-10-CM | POA: Insufficient documentation

## 2019-11-27 NOTE — Patient Instructions (Signed)
Patient here for trigger point injections for myofascial pain  Consent done and on chart.  Cleaned areas with alcohol and injected using a 27 gauge 1.5 inch needle  Injected 3cc  Using 1% Lidocaine with no EPI  Upper traps B/L Levators B/L Posterior scalenes B/L Middle scalenes Splenius Capitus Pectoralis Major B/L Rhomboids B/L Infraspinatus Teres Major/minor Thoracic paraspinals Lumbar paraspinals Other injections-    Patient's level of pain prior was 2/10 Current level of pain after injections is 4/10- is worse afterwards. Due to injection spots/needling.    There was no bleeding or complications.  Patient was advised to drink a lot of water on day after injections to flush system Will have increased soreness for 12-48 hours after injections.  Can use theracane on day AFTER  of injections in places didn't inject Can use heating pad or cooling pad 4-6 hours AFTER injections   2. Cannot do R RTC shoulder injection until 12/08/19.  3.  F/U - can go back to work today but drink a lot of water.

## 2019-11-27 NOTE — Progress Notes (Deleted)
Subjective:    Patient ID: Carrie Buchanan, female    DOB: [birth year], 44 y.o.   MRN: [id number]  HPI  Pain Inventory Average Pain 2 Pain Right Now 2 My pain is dull  In the last 24 hours, has pain interfered with the following? General activity 4 Relation with others 2 Enjoyment of life 2 What TIME of day is your pain at its worst? morning Sleep (in general) Fair  Pain is worse with: some activites Pain improves with: pacing activities Relief from Meds: 7  Mobility walk without assistance  Function Do you have any goals in this area?  yes  Neuro/Psych No problems in this area  Prior Studies Any changes since last visit?  no  Physicians involved in your care Any changes since last visit?  no   Family History  Problem Relation Age of Onset  . Asthma Mother    Social History   Socioeconomic History  . Marital status: Married    Spouse name: Not on file  . Number of children: Not on file  . Years of education: Not on file  . Highest education level: Not on file  Occupational History  . Not on file  Tobacco Use  . Smoking status: Never Smoker  . Smokeless tobacco: Never Used  Substance and Sexual Activity  . Alcohol use: No  . Drug use: No  . Sexual activity: Not Currently    Birth control/protection: I.U.D.  Other Topics Concern  . Not on file  Social History Narrative  . Not on file   Social Determinants of Health   Financial Resource Strain:   . Difficulty of Paying Living Expenses: Not on file  Food Insecurity:   . Worried About Charity fundraiser in the Last Year: Not on file  . Ran Out of Food in the Last Year: Not on file  Transportation Needs:   . Lack of Transportation (Medical): Not on file  . Lack of Transportation (Non-Medical): Not on file  Physical Activity:   . Days of Exercise per Week: Not on file  . Minutes of Exercise per Session: Not on file  Stress:   . Feeling of Stress : Not on file  Social  Connections:   . Frequency of Communication with Friends and Family: Not on file  . Frequency of Social Gatherings with Friends and Family: Not on file  . Attends Religious Services: Not on file  . Active Member of Clubs or Organizations: Not on file  . Attends Archivist Meetings: Not on file  . Marital Status: Not on file   Past Surgical History:  Procedure Laterality Date  . APPENDECTOMY    . INTRAUTERINE DEVICE INSERTION  09/2012  . LAPAROSCOPIC APPENDECTOMY N/A 02/09/2013   Procedure: APPENDECTOMY LAPAROSCOPIC;  Surgeon: Rolm Bookbinder, MD;  Location: [place];  Service: General;  Laterality: N/A;   Past Medical History:  Diagnosis Date  . Appendicitis, acute, with peritonitis 02/20/2013  . Medical history non-contributory   . Prediabetes    BP 117/76   Pulse 92   Temp 98.1 F (36.7 C)   Ht 5' (1.524 m)   Wt 159 lb (72.1 kg)   SpO2 98%   BMI 31.05 kg/m   Opioid Risk Score:   Fall Risk Score:  `1  Depression screen PHQ 2/9  Depression screen [facility] 2/9 09/06/2019 01/10/2019 11/21/2018 10/05/2018 05/11/2018 04/13/2018 01/12/2018  Decreased Interest 0 2 2 2  - 2 0  Down, Depressed, Hopeless 0 2  2 1 0 1 0  PHQ - 2 Score 0 4 4 3  0 3 0  Altered sleeping - 2 2 2 1  0 1  Tired, decreased energy - 1 2 1 1 1 1   Change in appetite - 0 0 0 0 0 0  Feeling bad or failure about yourself  - 2 2 1  - 0 0  Trouble concentrating - - 2 0 1 1 1   Moving slowly or fidgety/restless - 3 1 1  0 0 0  Suicidal thoughts - 0 0 0 0 0 0  PHQ-9 Score - 12 13 8 3 5 3   Some recent data might be hidden     Review of Systems  Constitutional: Negative.   HENT: Negative.   Eyes: Negative.   Respiratory: Negative.   Cardiovascular: Negative.   Gastrointestinal: Negative.   Endocrine: Negative.   Genitourinary: Negative.   Musculoskeletal: Positive for myalgias.  Skin: Negative.   Allergic/Immunologic: Negative.   Neurological: Negative.   Hematological: Negative.   Psychiatric/Behavioral:  Negative.   All other systems reviewed and are negative.      Objective:   Physical Exam        Assessment & Plan:

## 2019-11-27 NOTE — Progress Notes (Signed)
  Patient is a 44 yr old L handed female with myofascial pain due to trigger points as well as R shoulder impingement syndrome.   Can flex/abduct R shoulder, but is painful and catches.   The muscles are like "numb"in upper back/shoulder area Started since came here initially.  After the injections, it started. Mild numbness/decreased sensation only on upper back/posterior neck area.    R shoulder pain is better, but still has the catch.   Didn't get theracane. Can't do R shoulder injection until 12/08/19.  Started back to work 1 week ago.  Now the L arm and hand is tingling.  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  Also has tingling after sleeps on L side- from hand to forearm when feels it- feeling a little right now. Like it's swollen.   Exam: Awake, alert, appropriate, NAD No tinel's at carpal tunnel or L elbow sensation intact in L hand esp 2nd and 5th digits the same and 4th digit the same on both sides. Tight trigger points in neck, upper back and shoulders esp rhomboids and upper traps B/L  A/P:   Patient here for trigger point injections for myofascial pain  Consent done and on chart.  Cleaned areas with alcohol and injected using a 27 gauge 1.5 inch needle  Injected 3cc  Using 1% Lidocaine with no EPI  Upper traps B/L Levators B/L Posterior scalenes B/L Middle scalenes Splenius Capitus Pectoralis Major B/L Rhomboids B/L Infraspinatus Teres Major/minor Thoracic paraspinals Lumbar paraspinals Other injections-    Patient's level of pain prior was 2/10 Current level of pain after injections is 4/10- is worse afterwards. Due to injection spots/needling.    There was no bleeding or complications.  Patient was advised to drink a lot of water on day after injections to flush system Will have increased soreness for 12-48 hours after injections.  Can use theracane on day AFTER  of injections in places  didn't inject Can use heating pad or cooling pad 4-6 hours AFTER injections   2. Cannot do R RTC shoulder injection until 12/08/19.  3.  F/U - can go back to work today but drink a lot of water.

## 2019-12-08 ENCOUNTER — Ambulatory Visit: Payer: No Typology Code available for payment source | Admitting: Physical Medicine and Rehabilitation

## 2020-01-08 ENCOUNTER — Encounter: Payer: No Typology Code available for payment source | Admitting: Physical Medicine and Rehabilitation

## 2020-02-05 ENCOUNTER — Ambulatory Visit: Payer: Self-pay

## 2020-02-05 ENCOUNTER — Other Ambulatory Visit: Payer: Self-pay

## 2020-02-06 ENCOUNTER — Other Ambulatory Visit: Payer: Self-pay | Admitting: Nurse Practitioner

## 2020-02-06 ENCOUNTER — Ambulatory Visit: Payer: Self-pay | Attending: Nurse Practitioner | Admitting: Nurse Practitioner

## 2020-02-06 ENCOUNTER — Encounter: Payer: Self-pay | Admitting: Nurse Practitioner

## 2020-02-06 VITALS — BP 108/72 | HR 75 | Temp 97.7°F | Ht 60.0 in | Wt 153.0 lb

## 2020-02-06 DIAGNOSIS — R7303 Prediabetes: Secondary | ICD-10-CM

## 2020-02-06 DIAGNOSIS — M542 Cervicalgia: Secondary | ICD-10-CM

## 2020-02-06 DIAGNOSIS — E785 Hyperlipidemia, unspecified: Secondary | ICD-10-CM

## 2020-02-06 DIAGNOSIS — D72829 Elevated white blood cell count, unspecified: Secondary | ICD-10-CM

## 2020-02-06 DIAGNOSIS — G5603 Carpal tunnel syndrome, bilateral upper limbs: Secondary | ICD-10-CM

## 2020-02-06 LAB — POCT GLYCOSYLATED HEMOGLOBIN (HGB A1C): Hemoglobin A1C: 6.4 % — AB (ref 4.0–5.6)

## 2020-02-06 LAB — GLUCOSE, POCT (MANUAL RESULT ENTRY): POC Glucose: 153 mg/dl — AB (ref 70–99)

## 2020-02-06 MED ORDER — METFORMIN HCL ER 500 MG PO TB24: 500.0000 mg | ORAL_TABLET | Freq: Every day | ORAL | 3 refills | Status: DC

## 2020-02-06 MED ORDER — NAPROXEN 500 MG PO TABS: 500.0000 mg | ORAL_TABLET | Freq: Two times a day (BID) | ORAL | 1 refills | Status: DC

## 2020-02-06 MED ORDER — CYCLOBENZAPRINE HCL 5 MG PO TABS: 5.0000 mg | ORAL_TABLET | Freq: Three times a day (TID) | ORAL | 1 refills | Status: DC | PRN

## 2020-02-06 MED FILL — ?NAPROXEN 500 MG TABS: 500 | 30 days supply | Qty: 60 | Fill #0

## 2020-02-06 MED FILL — metFORMIN HCL ER 500 MG TB2: 500 | 90 days supply | Qty: 90 | Fill #0

## 2020-02-06 MED FILL — CYCLOBENZAPRINE 5 MG TABLET: 5 | 20 days supply | Qty: 60 | Fill #0

## 2020-02-06 NOTE — Progress Notes (Signed)
Assessment & Plan:  [name] was seen today for follow-up.  Diagnoses and all orders for this visit:  Prediabetes -     HgB A1c -     Glucose (CBG) -     metFORMIN (GLUCOPHAGE XR) 500 MG 24 hr tablet; Take 1 tablet (500 mg total) by mouth daily with breakfast. -     Basic metabolic panel  Cervicalgia -     cyclobenzaprine (FLEXERIL) 5 MG tablet; Take 1 tablet (5 mg total) by mouth 3 (three) times daily as needed for muscle spasms.  Carpal tunnel syndrome, bilateral -     naproxen (NAPROSYN) 500 MG tablet; Take 1 tablet (500 mg total) by mouth 2 (two) times daily with a meal. She has bilateral hand splints at home. Will wear daily for the next 4 weeks.  Dyslipidemia -     Lipid panel  Leukocytosis, unspecified type -     CBC    Patient has been counseled on age-appropriate routine health concerns for screening and prevention. These are reviewed and up-to-date. Referrals have been placed accordingly. Immunizations are up-to-date or declined.    Subjective:   Chief Complaint  Patient presents with  . Follow-up    Pt. is here for a follow up on her cholesteral and prediabetes. Pt. is not taking any of her medication.    HPI Carrie Buchanan 44 y.o. female presents to office today for follow up of dyslipidemia and prediabetes.   Prediabetes She stopped taking metformin last month. She states she just stopped taking it about a month ago. Denies any symptoms of hypo or hyperglycemia. Overdue for eye exam. Will resume metformin 500 mg daily. I explained to her that she is almost in the diabetes range and will need to  Lab Results  Component Value Date   HGBA1C 6.4 (A) 02/06/2020   Lab Results  Component Value Date   HGBA1C 5.6 07/25/2019    Carpal Tunnel Syndrome: Patient presents for presents evaluation of pain in hands, hand paresthesias and possible carpal tunnel syndrome.  Onset of the symptoms was several weeks ago. Current symptoms include tingling and  swelling in the bilateral wrists. L>R. Inciting event/aggravating factors: repetitive activity: . Patient's course of IR:CVELFYBOF worsening. Evaluation to date: none.  Treatment to date: none. She also has a history of ganglion cyst of the right wrist.   Lab Results  Component Value Date   CHOL 208 (H) 07/25/2019   CHOL 175 01/12/2018   CHOL 155 12/12/2015   Lab Results  Component Value Date   HDL 52 07/25/2019   HDL 47 01/12/2018   HDL 34 (L) 12/12/2015   Lab Results  Component Value Date   LDLCALC 135 (H) 07/25/2019   LDLCALC 105 (H) 01/12/2018   LDLCALC 94 12/12/2015   Lab Results  Component Value Date   TRIG 120 07/25/2019   TRIG 116 01/12/2018   TRIG 135 12/12/2015   Lab Results  Component Value Date   CHOLHDL 4.0 07/25/2019   CHOLHDL 3.7 01/12/2018   CHOLHDL 4.6 12/12/2015    The 10-year ASCVD risk score Mikey Bussing DC Jr., et al., 2013) is: 0.7%   Values used to calculate the score:     Age: 1 years     Sex: Female     Is Non-Hispanic African American: No     Diabetic: No     Tobacco smoker: No     Systolic Blood Pressure: 751 mmHg     Is BP treated: No  HDL Cholesterol: 52 mg/dL     Total Cholesterol: 208 mg/dL  Review of Systems  Constitutional: Negative for fever, malaise/fatigue and weight loss.  HENT: Negative.  Negative for nosebleeds.   Eyes: Negative.  Negative for blurred vision, double vision and photophobia.  Respiratory: Negative.  Negative for cough and shortness of breath.   Cardiovascular: Negative.  Negative for chest pain, palpitations and leg swelling.  Gastrointestinal: Negative.  Negative for heartburn, nausea and vomiting.  Musculoskeletal: Positive for back pain, joint pain, myalgias and neck pain.  Neurological: Negative.  Negative for dizziness, focal weakness, seizures and headaches.  Psychiatric/Behavioral: Negative.  Negative for suicidal ideas.    Past Medical History:  Diagnosis Date  . Appendicitis, acute, with peritonitis  02/20/2013  . Medical history non-contributory   . Prediabetes     Past Surgical History:  Procedure Laterality Date  . APPENDECTOMY    . INTRAUTERINE DEVICE INSERTION  09/2012  . LAPAROSCOPIC APPENDECTOMY N/A 02/09/2013   Procedure: APPENDECTOMY LAPAROSCOPIC;  Surgeon: Emelia Loron, MD;  Location: [facility] OR;  Service: General;  Laterality: N/A;    Family History  Problem Relation Age of Onset  . Asthma Mother     Social History Reviewed with no changes to be made today.   Outpatient Medications Prior to Visit  Medication Sig Dispense Refill  . Cholecalciferol (VITAMIN D3) 2000 units CHEW Chew by mouth.    . cyclobenzaprine (FLEXERIL) 5 MG tablet Take 1 tablet (5 mg total) by mouth 3 (three) times daily as needed for muscle spasms. Patient will pick up 05-04-2019 60 tablet 1  . naproxen (NAPROSYN) 500 MG tablet Take 1 tablet (500 mg total) by mouth 2 (two) times daily with a meal. 60 tablet 1  . atorvastatin (LIPITOR) 20 MG tablet Take 1 tablet (20 mg total) by mouth daily. 90 tablet 3  . metFORMIN (GLUCOPHAGE XR) 500 MG 24 hr tablet Take 1 tablet (500 mg total) by mouth daily with breakfast. (Patient not taking: Reported on 02/06/2020) 90 tablet 3   No facility-administered medications prior to visit.    No Known Allergies     Objective:    BP 108/72 (BP Location: Left Arm, Patient Position: Sitting, Cuff Size: Normal)   Pulse 75   Temp 97.7 F (36.5 C) (Temporal)   Ht 5' (1.524 m)   Wt 153 lb (69.4 kg)   SpO2 98%   BMI 29.88 kg/m  Wt Readings from Last 3 Encounters:  02/06/20 153 lb (69.4 kg)  11/27/19 159 lb (72.1 kg)  10/25/19 159 lb (72.1 kg)    Physical Exam Vitals and nursing note reviewed.  Constitutional:      Appearance: She is well-developed.  HENT:     Head: Normocephalic and atraumatic.  Cardiovascular:     Rate and Rhythm: Normal rate and regular rhythm.     Heart sounds: Normal heart sounds. No murmur. No friction rub. No gallop.   Pulmonary:      Effort: Pulmonary effort is normal. No tachypnea or respiratory distress.     Breath sounds: Normal breath sounds. No decreased breath sounds, wheezing, rhonchi or rales.  Chest:     Chest wall: No tenderness.  Abdominal:     General: Bowel sounds are normal.     Palpations: Abdomen is soft.  Musculoskeletal:        General: Swelling and tenderness present. Normal range of motion.     Right wrist: Tenderness present. No swelling or bony tenderness.     Left  wrist: Swelling and tenderness present. No bony tenderness.     Cervical back: Normal range of motion.  Skin:    General: Skin is warm and dry.  Neurological:     Mental Status: She is alert and oriented to person, place, and time.     Coordination: Coordination normal.  Psychiatric:        Behavior: Behavior normal. Behavior is cooperative.        Thought Content: Thought content normal.        Judgment: Judgment normal.          Patient has been counseled extensively about nutrition and exercise as well as the importance of adherence with medications and regular follow-up. The patient was given clear instructions to go to ER or return to medical center if symptoms don't improve, worsen or new problems develop. The patient verbalized understanding.   Follow-up: Return in about 4 weeks (around 03/05/2020) for bilateral wrist pain.   Claiborne Rigg, FNP-BC [facility] [place], [place] [phone]   02/06/2020, 9:12 AM

## 2020-02-06 NOTE — Patient Instructions (Signed)
Sndrome del tnel carpiano Carpal Tunnel Syndrome  El sndrome del tnel carpiano es una afeccin que causa dolor en la mano y en el brazo. El tnel carpiano es un rea estrecha que se encuentra en el lado palmar de la mueca. Los movimientos repetidos de la mueca o determinadas enfermedades pueden causar hinchazn en el tnel. Esta hinchazn puede comprimir el nervio principal de la mueca (nervio mediano). Cules son las causas? Esta afeccin puede ser causada por lo siguiente:  Movimientos repetidos de la mueca.  Lesiones en la mueca.  Artritis.  Un saco lleno de lquido (quiste) o un crecimiento anormal (tumor) en el tnel carpiano.  Acumulacin de lquido durante el embarazo. Algunas veces, la causa no se conoce. Qu incrementa el riesgo? Los siguientes factores pueden hacer que sea ms propenso a contraer esta afeccin:  Tener un trabajo que exige mover la mueca del mismo modo muchas veces. Esto incluye trabajos como el de carnicero o el de cajero.  Ser mujer.  Tener otras afecciones, por ejemplo: ? Diabetes. ? Obesidad. ? Una glndula tiroidea que no es lo suficientemente activa (hipotiroidismo). ? Insuficiencia renal. Cules son los signos o sntomas? Los sntomas de esta afeccin incluyen:  Sensacin de hormigueo en los dedos.  Hormigueo o prdida de la sensibilidad (adormecimiento) en la mano.  Dolor en todo el brazo. Este dolor puede empeorar al flexionar la mueca y el codo durante mucho tiempo.  Dolor en la mueca que sube por el brazo hasta el hombro.  Dolor que baja hasta la palma de la mano o los dedos.  Sensacin de debilidad en las manos. Puede resultarle difcil tomar y sostener objetos. Es posible que se sienta peor por la noche. Cmo se diagnostica? Esta afeccin se diagnostica mediante los antecedentes mdicos y un examen fsico. Tambin pueden hacerle estudios, por ejemplo:  Una electromiograma (EMG). Este estudio comprueba las seales  que los nervios envan a los msculos.  Estudio de conduccin nerviosa. Este estudio comprueba si las seales pasan correctamente por los nervios.  Estudios de diagnstico por imgenes, como radiografas, una ecografa y una resonancia magntica (RM). Estos estudios permiten detectar cul podra ser la causa de su afeccin. Cmo se trata? El tratamiento de esta afeccin puede incluir:  Cambios en el estilo de vida. Se le pedir que deje o cambie la actividad que caus el problema.  Hacer ejercicio y actividades para que los msculos y los huesos se vuelvan ms fuertes (fisioterapia).  Aprender a usar la mano nuevamente (terapia ocupacional).  Medicamentos para tratar el dolor y la hinchazn (inflamacin). Es posible que le apliquen inyecciones en la mueca.  Una frula para la mueca.  Una ciruga. Siga estas indicaciones en su casa: Si tiene una frula:  Use la frula como se lo haya indicado el mdico. Quteselo solamente como se lo haya indicado el mdico.  Afloje la frula si los dedos: ? Hormiguean. ? Pierden la sensibilidad (se adormecen). ? Se tornan fros y de color azul.  Mantenga la frula limpia.  Si la frula no es impermeable: ? No deje que se moje. ? Cbralo con un envoltorio hermtico cuando tome un bao de inmersin o una ducha. Control del dolor, la rigidez y la hinchazn   Si se lo indican, aplique hielo sobre la zona dolorida: ? Si tiene una frula desmontable, qutesela como se lo haya indicado el mdico. ? Ponga el hielo en una bolsa plstica. ? Coloque una toalla entre la piel y la bolsa. ? Coloque el hielo   durante , 2a3veces al [employer]. Indicaciones generales  [employer] de venta libre y los recetados solamente como se lo haya indicado el mdico.  Descanse la [place] de cualquier actividad que le cause dolor. Si es necesario, hable con su jefe en el [employer] cambios que pueden ayudar a la curacin de la [place].  Haga  los ejercicios como se lo hayan indicado el mdico, el fisioterapeuta o el terapeuta ocupacional.  Concurra a todas las visitas de seguimiento como se lo haya indicado el mdico. Esto es importante. Comunquese con un mdico si:  Aparecen nuevos sntomas.  Los medicamentos no Tourist information centre manager.  Sus sntomas empeoran. Solicite ayuda de inmediato si:  Tiene adormecimiento u hormigueo muy intensos en la mueca o la [place]. Resumen  El sndrome del tnel carpiano es una afeccin que causa dolor en la mano y en el brazo.  Suele deberse a movimientos repetidos de Warden/ranger.  Este problema se trata mediante cambios en el estilo de vida y medicamentos. La ciruga puede ser necesaria en casos muy graves.  Siga las indicaciones del mdico sobre el uso de una frula, el reposo de la [place], la asistencia a las consultas de control y Freight forwarder para pedir ayuda. Esta informacin no tiene Theme park manager el consejo del mdico. Asegrese de hacerle al mdico cualquier pregunta que tenga. Document Revised: 03/24/2018 Document Reviewed: 03/24/2018 Elsevier Patient Education  2020 Elsevier Inc.  Prediabetes Prediabetes La prediabetes es la afeccin de tener un nivel de azcar en la sangre (glucemia ms alto de lo normal, aunque no lo suficientemente alto para recibir un diagnstico de diabetes tipo2. El hecho de ser prediabtico lo pone en riesgo de desarrollar diabetes tipo 2 (diabetes mellitus tipo2). La prediabetes puede denominarse intolerancia a la glucosa o alteracin de la glucosa en ayunas. Generalmente, la prediabetes no causa sntomas. El mdico puede diagnosticar esta afeccin por los anlisis de [place]. Es posible que le realicen un anlisis para determinar si tiene prediabetes si tiene sobrepeso y al menos algn otro factor de riesgo de prediabetes. Qu es la glucemia y cmo se mide? La glucemia es la cantidad de glucosa presente en el torrente sanguneo. La glucosa proviene de los  alimentos que ingiere que contienen azcares y almidones (hidratos de carbono), que el cuerpo descompone en glucosa. El nivel de glucemia se puede medir en mg/dl (miligramos por decilitro) o mmol/l (milimoles por litro). La glucemia puede comprobarse con uno o ms de los siguientes anlisis de sangre:  Prueba de la glucemia en ayunas. No se le permitir comer (tendr que hacer ayuno) durante 8horas o ms antes de que se le tome una [place]. ? El rango normal de glucemia en ayunas es de 70 a 100mg /dl (3,9 a ).  Una prueba de sangre de A1c (hemoglobina A1c). Esta prueba proporciona informacin sobre el control de la glucemia durante los ltimos 2 o 1,2XNTZ/G.  Prueba de tolerancia a la glucosa oral (PTGO). Esta prueba mide la glucemia en dos momentos: ? Despus de ayunar. Este es el valor inicial. ? Dos horas despus de tomar una bebida que contiene glucosa. Pueden diagnosticarle prediabetes en los siguientes casos:  Si su glucemia en ayunas es de 100 a 125 mg/dl (5,6 a 6,9 mmol/l).  Si su nivel de A1c es de 5,7 a 6,4%.  Si su resultado de la PTGO es de 140 a 199mg /dl (7,8 a ). Estas pruebas de sangre se pueden repetir para . Cmo puede  afectarme esta enfermedad? El pncreas produce una hormona (insulina) que ayuda a Merchant navy officer glucosa del torrente sanguneo al interior de las clulas. Cuando las clulas del cuerpo no responden adecuadamente a la insulina que el cuerpo produce (resistencia a la insulina), se acumula exceso de glucosa en la sangre en lugar de entrar en las clulas. Como [place], se puede producir un nivel alto de glucemia (hiperglucemia), lo cual puede causar muchas complicaciones. La hiperglucemia es un sntoma de prediabetes. Tener la glucemia alta durante mucho tiempo es peligroso. Demasiada glucosa en la sangre puede daar los nervios y los vasos sanguneos. El dao prolongado puede derivar en complicaciones de la diabetes,  que pueden incluir:  Enfermedad cardaca.  Accidente cerebrovascular.  Ceguera.  Enfermedad renal.  Depresin.  Circulacin deficiente en los pies y las piernas, lo cual podra ocasionar una extirpacin quirrgica (amputacin) en casos graves. Qu puede aumentar el riesgo? Algunos de los factores de riesgo de prediabetes son los siguientes:  Warehouse manager un familiar con diabetes tipo2.  Tener exceso de [place] u obesidad.  Ser mayor de 45aos de edad.  Ser descendiente de indgenas norteamericanos, afroamericanos, hispanos o latinos, o asiticos o isleos del Pacfico.  Tener un estilo de vida inactivo (sedentario).  Tener antecedentes de enfermedades cardacas.  En las mujeres, tener antecedentes de diabetes gestacional o sndrome del ovario poliqustico (SOP).  Tener niveles bajos del colesterol bueno (HDL-C) o niveles altos de grasas en la sangre (triglicridos).  Tener presin arterial alta. Qu puedo hacer para prevenir la diabetes?      Haga actividad fsica. ? Haga actividad fsica de intensidad moderada durante o ms 5das por semana o con la frecuencia que le indique su mdico. Esto podra incluir caminatas dinmicas, ciclismo o gimnasia acutica. ? Pregntele al mdico qu actividades son seguras para usted. Una combinacin de actividades puede ser la mejor opcin, por ejemplo, caminar, practicar natacin, andar en bicicleta y hacer entrenamiento de fuerza.  Baje de [employer] se lo haya indicado el mdico. ? Bajar entre el 5% y el 7% del peso corporal puede revertir la resistencia a la insulina. ? El mdico puede determinar cunto peso tiene que perder y Restaurant manager, fast food a que adelgace de Wellsite geologist segura.  Siga un plan de alimentacin saludable. Este incluye consumir protenas magras, hidratos de carbono complejos, frutas y verduras frescas, productos lcteos con bajo contenido de grasa y grasas saludables. ? Siga las indicaciones del mdico respecto de las  restricciones de comidas o bebidas. ? Programe una cita con un especialista en alimentacin y nutricin (nutricionista certificado) para que lo ayude a Doctor, hospital plan de alimentacin saludable adecuado para usted.  No fume ni consuma ningn producto que contenga tabaco, lo que incluye cigarrillos, tabaco de Theatre manager y Administrator, Civil Service. Si necesita ayuda para dejar de fumar, consulte al [employer].  [employer] recetados y de venta libre como se lo haya indicado el mdico. Posiblemente le receten medicamentos para ayudarlo a reducir el riesgo de tener diabetes tipo2.  Concurra a todas las visitas de [address] se lo haya indicado el mdico. Esto es importante. Resumen  La prediabetes es la afeccin de tener un nivel de azcar en la sangre (glucemia ms alto de lo normal, aunque no lo suficientemente alto para recibir un diagnstico de diabetes tipo2.  El hecho de ser prediabtico lo pone en riesgo de desarrollar diabetes tipo 2 (diabetes mellitus tipo2).  Para ayudar a prevenir la diabetes tipo2, haga cambios en el estilo de vida, como realizar [place]  fsica y comer alimentos saludables. Baje de [employer] se lo haya indicado el mdico. Esta informacin no tiene Marine scientist el consejo del mdico. Asegrese de hacerle al mdico cualquier pregunta que tenga. Document Revised: 01/17/2018 Document Reviewed: 12/10/2015 Elsevier Patient Education  [place].

## 2020-02-07 LAB — LIPID PANEL
Chol/HDL Ratio: 3.9 ratio (ref 0.0–4.4)
Cholesterol, Total: 189 mg/dL (ref 100–199)
HDL: 49 mg/dL (ref >39)
LDL Chol Calc (NIH): 111 mg/dL — ABNORMAL HIGH (ref 0–99)
Triglycerides: 165 mg/dL — ABNORMAL HIGH (ref 0–149)
VLDL Cholesterol Cal: 29 mg/dL (ref 5–40)

## 2020-02-07 LAB — BASIC METABOLIC PANEL
BUN/Creatinine Ratio: 21 (ref 9–23)
BUN: 15 mg/dL (ref 6–24)
CO2: 24 mmol/L (ref 20–29)
Calcium: 9.6 mg/dL (ref 8.7–10.2)
Chloride: 105 mmol/L (ref 96–106)
Creatinine, Ser: 0.73 mg/dL (ref 0.57–1.00)
GFR calc Af Amer: 115 mL/min/{1.73_m2} (ref >59)
GFR calc non Af Amer: 100 mL/min/{1.73_m2} (ref >59)
Glucose: 137 mg/dL — ABNORMAL HIGH (ref 65–99)
Potassium: 4.6 mmol/L (ref 3.5–5.2)
Sodium: 140 mmol/L (ref 134–144)

## 2020-02-07 LAB — CBC
Hematocrit: 40 % (ref 34.0–46.6)
Hemoglobin: 13.4 g/dL (ref 11.1–15.9)
MCH: 31.8 pg (ref 26.6–33.0)
MCHC: 33.5 g/dL (ref 31.5–35.7)
MCV: 95 fL (ref 79–97)
Platelets: 286 10*3/uL (ref 150–450)
RBC: 4.22 x10E6/uL (ref 3.77–5.28)
RDW: 12.9 % (ref 11.7–15.4)
WBC: 7.1 10*3/uL (ref 3.4–10.8)

## 2020-02-16 ENCOUNTER — Other Ambulatory Visit: Payer: Self-pay

## 2020-02-16 ENCOUNTER — Encounter: Payer: Self-pay | Admitting: Physical Medicine and Rehabilitation

## 2020-02-16 ENCOUNTER — Encounter: Payer: Self-pay | Attending: Physical Medicine and Rehabilitation | Admitting: Physical Medicine and Rehabilitation

## 2020-02-16 VITALS — BP 122/78 | HR 78 | Temp 97.7°F | Ht 60.0 in | Wt 154.0 lb

## 2020-02-16 DIAGNOSIS — G894 Chronic pain syndrome: Secondary | ICD-10-CM

## 2020-02-16 DIAGNOSIS — M7918 Myalgia, other site: Secondary | ICD-10-CM | POA: Insufficient documentation

## 2020-02-16 DIAGNOSIS — M7541 Impingement syndrome of right shoulder: Secondary | ICD-10-CM | POA: Insufficient documentation

## 2020-02-16 NOTE — Patient Instructions (Signed)
1. Magnesium Over the counter- 400 mg- 1-3 pills/day.  Only side effect is loose stools- used as muscle relaxant.     2.  Theracane from [employer]- $30- or online- firm pressure 2-4 minutes- no massage.   3. Patient here for trigger point injections for myofascial pain  Consent done and on chart.  Cleaned areas with alcohol and injected using a 27 gauge 1.5 inch needle  Injected 5cc Using 1% Lidocaine with no EPI  Upper traps B/L Levators B/L Posterior scalenes B/L Middle scalenes Splenius Capitus Pectoralis Major B/L Rhomboids B/L Infraspinatus Teres Major/minor Thoracic paraspinals Lumbar paraspinals Other injections-    Patient's level of pain prior was 3/10 Current level of pain after injections is ~3/10 but feels looser- has improved ROM  There was no bleeding or complications.  Patient was advised to drink a lot of water on day after injections to flush system Will have increased soreness for 12-48 hours after injections.  Can use Lidocaine patches the day AFTER injections Can use theracane on day of injections in places didn't inject Can use heating pad 4-6 hours AFTER injections   4. Explained she has muscle pain- doesn't respond well to meds- responds to bodywork.muscle work.   5. F/U in 6-8 weeks

## 2020-02-16 NOTE — Progress Notes (Signed)
Once got trigger point injections- were sore and then pain decreased slowly-  The pain came back ~ 3 weeks ago.  So lasted 7 weeks-   R shoulder still hurts, but less now. Now the L shoulder hurts more.     Doesn't think muscle relaxant helps her.      Whenever doing a lot of movement with chores, etc, the pain comes back.For last few weeks.   Was cooking tamales yesterday, has pain in LUE- had a "lump" that formed- that sounds like muscle spasm.       .Plan: 1. Magnesium Over the counter- 400 mg- 1-3 pills/day.  Only side effect is loose stools- used as muscle relaxant.     2.  Theracane from [employer]- $30- or online- firm pressure 2-4 minutes- no massage.   3. Patient here for trigger point injections for myofascial pain  Consent done and on chart.  Cleaned areas with alcohol and injected using a 27 gauge 1.5 inch needle  Injected 5cc Using 1% Lidocaine with no EPI  Upper traps B/L Levators B/L Posterior scalenes B/L Middle scalenes Splenius Capitus Pectoralis Major B/L Rhomboids B/L Infraspinatus Teres Major/minor Thoracic paraspinals Lumbar paraspinals Other injections-    Patient's level of pain prior was 3/10 Current level of pain after injections is ~3/10 but feels looser- has improved ROM  There was no bleeding or complications.  Patient was advised to drink a lot of water on day after injections to flush system Will have increased soreness for 12-48 hours after injections.  Can use Lidocaine patches the day AFTER injections Can use theracane on day of injections in places didn't inject Can use heating pad 4-6 hours AFTER injections   4. Explained she has muscle pain- doesn't respond well to meds- responds to bodywork.muscle work.   5. F/U in 6-8 weeks -   I spent a total of 30 minutes on appointment- more than 20 minutes discussing options for muscle pain.

## 2020-04-03 ENCOUNTER — Other Ambulatory Visit: Payer: Self-pay

## 2020-04-03 ENCOUNTER — Encounter: Payer: Self-pay | Admitting: Physical Medicine and Rehabilitation

## 2020-04-03 ENCOUNTER — Encounter: Payer: Self-pay | Attending: Physical Medicine and Rehabilitation | Admitting: Physical Medicine and Rehabilitation

## 2020-04-03 VITALS — BP 125/77 | HR 81 | Temp 97.9°F | Ht 60.0 in | Wt 154.6 lb

## 2020-04-03 DIAGNOSIS — M7522 Bicipital tendinitis, left shoulder: Secondary | ICD-10-CM

## 2020-04-03 DIAGNOSIS — M7541 Impingement syndrome of right shoulder: Secondary | ICD-10-CM | POA: Insufficient documentation

## 2020-04-03 DIAGNOSIS — G894 Chronic pain syndrome: Secondary | ICD-10-CM

## 2020-04-03 DIAGNOSIS — M7918 Myalgia, other site: Secondary | ICD-10-CM | POA: Insufficient documentation

## 2020-04-03 NOTE — Patient Instructions (Signed)
Plan: 1. Suggest changing memory foam pillow- usually sleep on side or back.  Suggest a cut out for neck. I use Sutera, but doesn't need to get exact pillow, but that shape it helpful.    2. steroid injection was performed at bicipital tendons on L using 1% plain Lidocaine and 40mg  /1cc of Kenalog. This was well tolerated.  Cleaned with betadine x3 and allowed to dry- then alcohol then injected using 27 gauge 1.5 inch needle- no bleeding or complications.    F/U in 3 months for steroid injection of L shoulders/biceps tendons.  Lidocaine will kick in 15 minutes- and wear off tonight- the steroid will kick in tomorrow within 24 hours and take up to 72 hours to fully kick in.  3. Patient here for trigger point injections for myofascial pain  Consent done and on chart.  Cleaned areas with alcohol and injected using a 27 gauge 1.5 inch needle  Injected 5cc Using 1% Lidocaine with no EPI  Upper traps B/L Levators B/L Posterior scalenes Middle scalenes Splenius Capitus Pectoralis Major b/L Rhomboids B/L and on L x1 Infraspinatus Teres Major/minor L only Thoracic paraspinals Lumbar paraspinals Other injections-    Patient's level of pain prior was 2/10 Current level of pain after injections is 2/10- takes longer for her ot work.   There was no bleeding or complications.  Patient was advised to drink a lot of water on day after injections to flush system Will have increased soreness for 12-48 hours after injections.  Can use Lidocaine patches the day AFTER injections Can use theracane on day of injections in places didn't inject Can use heating pad 4-6 hours AFTER injections  4. Use theracane 3-4x/week for 20-30 minutes at a time.    5.  F/U- 8 weeks- for injections

## 2020-04-03 NOTE — Progress Notes (Signed)
Patient is a 44 yr old L handed female with myofascial pain due to trigger points as well as R shoulder impingement syndrome.   Thinks are doing better  When wakes up in morning, has a lot of pain- in arms and shoulders- tight-  Takes ~ 10 minutes to get moving.   Got theracane- uses- ~ 10 minutes at a time- 1-2x/week. On ~ 4 muscles at a time.   Throbbing- in L shoulder- got injection in R shoulder 3-4 months ago.  So L shoulder is starting to bother her.   Has a pillow that's memory foam- not using it.  Thought pain was in shoulders, not neck- so wasn't using it.    Taking Magnesium-  2pills/day- yes some- helps for a few days, but then pain comes back in shoulder.   L shoulder/upper traps as well as bicipital tendinitis on L is her main issue   Exam: Awake, alert, appropriate, on table, NAD TTP over L bicipital tendon- at origin- L anterior shoulder Also tight trigger points in upper traps, levators, scalenes and rhomboids- splenius capitus is loose.      Plan: 1. Suggest changing memory foam pillow- usually sleep on side or back.  Suggest a cut out for neck. I use Sutera, but doesn't need to get exact pillow, but that shape it helpful.    2. steroid injection was performed at bicipital tendons on L using 1% plain Lidocaine and 40mg  /1cc of Kenalog. This was well tolerated.  Cleaned with betadine x3 and allowed to dry- then alcohol then injected using 27 gauge 1.5 inch needle- no bleeding or complications.    F/U in 3 months for steroid injection of L shoulders/biceps tendons.  Lidocaine will kick in 15 minutes- and wear off tonight- the steroid will kick in tomorrow within 24 hours and take up to 72 hours to fully kick in.  3. Patient here for trigger point injections for myofascial pain  Consent done and on chart.  Cleaned areas with alcohol and injected using a 27 gauge 1.5 inch needle  Injected 5cc Using 1% Lidocaine with no EPI  Upper traps B/L Levators  B/L Posterior scalenes Middle scalenes Splenius Capitus Pectoralis Major b/L Rhomboids B/L and on L x1 Infraspinatus Teres Major/minor L only Thoracic paraspinals Lumbar paraspinals Other injections-    Patient's level of pain prior was 2/10 Current level of pain after injections is 2/10- takes longer for her to work but LUE is looser and has more ROM.   There was no bleeding or complications.  Patient was advised to drink a lot of water on day after injections to flush system Will have increased soreness for 12-48 hours after injections.  Can use Lidocaine patches the day AFTER injections Can use theracane on day of injections in places didn't inject Can use heating pad 4-6 hours AFTER injections  4. Use theracane 3-4x/week mimimum-  for 20-30 minutes at a time. Use more than using, basically. Spend at least 2 minutes on each muscle.    5.  F/U- 8 weeks- for injections

## 2020-04-05 ENCOUNTER — Ambulatory Visit: Payer: No Typology Code available for payment source | Admitting: Physical Medicine and Rehabilitation

## 2020-05-29 ENCOUNTER — Encounter: Payer: Self-pay | Admitting: Physical Medicine and Rehabilitation

## 2020-09-25 ENCOUNTER — Other Ambulatory Visit: Payer: Self-pay

## 2020-09-25 ENCOUNTER — Ambulatory Visit: Payer: Self-pay | Attending: Nurse Practitioner

## 2020-10-04 ENCOUNTER — Other Ambulatory Visit: Payer: Self-pay | Admitting: Obstetrics & Gynecology

## 2020-10-04 DIAGNOSIS — Z1231 Encounter for screening mammogram for malignant neoplasm of breast: Secondary | ICD-10-CM

## 2020-10-23 ENCOUNTER — Ambulatory Visit: Payer: No Typology Code available for payment source | Admitting: Nurse Practitioner

## 2020-12-13 ENCOUNTER — Ambulatory Visit: Payer: Self-pay | Attending: Nurse Practitioner | Admitting: Nurse Practitioner

## 2020-12-13 ENCOUNTER — Other Ambulatory Visit: Payer: Self-pay

## 2020-12-13 ENCOUNTER — Other Ambulatory Visit: Payer: Self-pay | Admitting: Nurse Practitioner

## 2020-12-13 ENCOUNTER — Encounter: Payer: Self-pay | Admitting: Nurse Practitioner

## 2020-12-13 VITALS — BP 119/78 | HR 77 | Resp 16 | Wt 157.0 lb

## 2020-12-13 DIAGNOSIS — E781 Pure hyperglyceridemia: Secondary | ICD-10-CM

## 2020-12-13 DIAGNOSIS — R7303 Prediabetes: Secondary | ICD-10-CM

## 2020-12-13 DIAGNOSIS — D72829 Elevated white blood cell count, unspecified: Secondary | ICD-10-CM

## 2020-12-13 DIAGNOSIS — G5603 Carpal tunnel syndrome, bilateral upper limbs: Secondary | ICD-10-CM

## 2020-12-13 MED ORDER — GABAPENTIN 300 MG PO CAPS: 300.0000 mg | ORAL_CAPSULE | Freq: Three times a day (TID) | ORAL | 3 refills | Status: DC

## 2020-12-13 MED FILL — GABAPENTIN 300 MG CAPSULE: 300 | 30 days supply | Qty: 90 | Fill #0

## 2020-12-13 NOTE — Progress Notes (Signed)
Shoulder and hand discomfort

## 2020-12-13 NOTE — Progress Notes (Signed)
 Assessment & Plan:  Diagnoses and all orders for this visit:  Carpal tunnel syndrome, bilateral -     Ambulatory referral to Hand Surgery -     gabapentin (NEURONTIN) 300 MG capsule; Take 1 capsule (300 mg total) by mouth 3 (three) times daily.  Low calcium levels -     CMP14+EGFR  High triglycerides -     Lipid panel  Prediabetes -     Hemoglobin A1c  Leukocytosis, unspecified type -     CBC    Patient has been counseled on age-appropriate routine health concerns for screening and prevention. These are reviewed and up-to-date. Referrals have been placed accordingly. Immunizations are up-to-date or declined.    Subjective:  No chief complaint on file.  HPI Carrie Buchanan 44 y.o. female presents to office today   has a past medical history of Appendicitis, acute, with peritonitis (02/20/2013), Medical history non-contributory, and Prediabetes.  VRI was used to communicate directly with patient for the entire encounter including providing detailed patient instructions.    She has complaints of pins and needles sensation in both hands and wrists. She has been taking an OTC Curcuma which has a component of turmeric in it and this has slightly helped to relieve her pain. Pain is worse in the left hand and with repetitive fine mother activities. Associated symptoms: tightness in the left arm.   Prediabetes  Currently taking metformin 500 mg daily as prescribed. Denies any symptoms of hypo or hyperglycemia.  Lab Results  Component Value Date   HGBA1C 6.4 02/06/2020    Review of Systems  Constitutional: Negative for fever, malaise/fatigue and weight loss.  HENT: Negative.  Negative for nosebleeds.   Eyes: Negative.  Negative for blurred vision, double vision and photophobia.  Respiratory: Negative.  Negative for cough and shortness of breath.   Cardiovascular: Negative.  Negative for chest pain, palpitations and leg swelling.  Gastrointestinal: Negative.   Negative for heartburn, nausea and vomiting.  Musculoskeletal: Positive for joint pain. Negative for myalgias.  Neurological: Positive for tingling and sensory change. Negative for dizziness, focal weakness, seizures and headaches.  Psychiatric/Behavioral: Negative.  Negative for suicidal ideas.    Past Medical History:  Diagnosis Date  . Appendicitis, acute, with peritonitis 02/20/2013  . Medical history non-contributory   . Prediabetes     Past Surgical History:  Procedure Laterality Date  . APPENDECTOMY    . INTRAUTERINE DEVICE INSERTION  09/2012  . LAPAROSCOPIC APPENDECTOMY N/A 02/09/2013   Procedure: APPENDECTOMY LAPAROSCOPIC;  Surgeon: Matthew Wakefield, MD;  Location: [facility] OR;  Service: General;  Laterality: N/A;    Family History  Problem Relation Age of Onset  . Asthma Mother     Social History Reviewed with no changes to be made today.   Outpatient Medications Prior to Visit  Medication Sig Dispense Refill  . Cholecalciferol (VITAMIN D3) 2000 units CHEW Chew by mouth.    . cyclobenzaprine (FLEXERIL) 5 MG tablet Take 1 tablet (5 mg total) by mouth 3 (three) times daily as needed for muscle spasms. (Patient not taking: Reported on 12/13/2020) 60 tablet 1  . metFORMIN (GLUCOPHAGE XR) 500 MG 24 hr tablet Take 1 tablet (500 mg total) by mouth daily with breakfast. (Patient not taking: Reported on 12/13/2020) 90 tablet 3  . naproxen (NAPROSYN) 500 MG tablet Take 1 tablet (500 mg total) by mouth 2 (two) times daily with a meal. (Patient not taking: Reported on 12/13/2020) 60 tablet 1   No facility-administered medications prior   to visit.    No Known Allergies     Objective:    BP 119/78   Pulse 77   Resp 16   Wt 157 lb (71.2 kg)   SpO2 99%   BMI 30.66 kg/m  Wt Readings from Last 3 Encounters:  12/13/20 157 lb (71.2 kg)  04/03/20 154 lb 9.6 oz (70.1 kg)  02/16/20 154 lb (69.9 kg)    Physical Exam Vitals and nursing note reviewed.  Constitutional:      Appearance:  She is well-developed and well-nourished.  HENT:     Head: Normocephalic and atraumatic.  Eyes:     Extraocular Movements: EOM normal.  Cardiovascular:     Rate and Rhythm: Normal rate and regular rhythm.     Pulses: Intact distal pulses.     Heart sounds: Normal heart sounds. No murmur heard. No friction rub. No gallop.   Pulmonary:     Effort: Pulmonary effort is normal. No tachypnea or respiratory distress.     Breath sounds: Normal breath sounds. No decreased breath sounds, wheezing, rhonchi or rales.  Chest:     Chest wall: No tenderness.  Abdominal:     General: Bowel sounds are normal.     Palpations: Abdomen is soft.  Musculoskeletal:        General: No edema. Normal range of motion.     Right hand: Tenderness present. No swelling or deformity. Normal range of motion.     Left hand: Tenderness present. No swelling or deformity. Normal range of motion.     Cervical back: Normal range of motion.  Skin:    General: Skin is warm and dry.  Neurological:     Mental Status: She is alert and oriented to person, place, and time.     Coordination: Coordination normal.  Psychiatric:        Mood and Affect: Mood and affect normal.        Behavior: Behavior normal. Behavior is cooperative.        Thought Content: Thought content normal.        Judgment: Judgment normal.          Patient has been counseled extensively about nutrition and exercise as well as the importance of adherence with medications and regular follow-up. The patient was given clear instructions to go to ER or return to medical center if symptoms don't improve, worsen or new problems develop. The patient verbalized understanding.   Follow-up: Return for PAP in [place].   Gildardo Pounds, FNP-BC [facility], [place]   12/14/2020, 3:46 PM

## 2020-12-14 ENCOUNTER — Encounter: Payer: Self-pay | Admitting: Nurse Practitioner

## 2020-12-14 LAB — HEMOGLOBIN A1C
Est. average glucose Bld gHb Est-mCnc: 146 mg/dL
Hgb A1c MFr Bld: 6.7 % — ABNORMAL HIGH (ref 4.8–5.6)

## 2020-12-14 LAB — CMP14+EGFR
ALT: 19 IU/L (ref 0–32)
AST: 20 IU/L (ref 0–40)
Albumin/Globulin Ratio: 1.7 (ref 1.2–2.2)
Albumin: 4.4 g/dL (ref 3.8–4.8)
Alkaline Phosphatase: 90 IU/L (ref 44–121)
BUN/Creatinine Ratio: 17 (ref 9–23)
BUN: 16 mg/dL (ref 6–24)
Bilirubin Total: 0.3 mg/dL (ref 0.0–1.2)
CO2: 21 mmol/L (ref 20–29)
Calcium: 9.2 mg/dL (ref 8.7–10.2)
Chloride: 106 mmol/L (ref 96–106)
Creatinine, Ser: 0.92 mg/dL (ref 0.57–1.00)
GFR calc Af Amer: 86 mL/min/{1.73_m2} (ref >59)
GFR calc non Af Amer: 75 mL/min/{1.73_m2} (ref >59)
Globulin, Total: 2.6 g/dL (ref 1.5–4.5)
Glucose: 133 mg/dL — ABNORMAL HIGH (ref 65–99)
Potassium: 3.9 mmol/L (ref 3.5–5.2)
Sodium: 142 mmol/L (ref 134–144)
Total Protein: 7 g/dL (ref 6.0–8.5)

## 2020-12-14 LAB — LIPID PANEL
Chol/HDL Ratio: 3.9 ratio (ref 0.0–4.4)
Cholesterol, Total: 204 mg/dL — ABNORMAL HIGH (ref 100–199)
HDL: 52 mg/dL (ref >39)
LDL Chol Calc (NIH): 136 mg/dL — ABNORMAL HIGH (ref 0–99)
Triglycerides: 87 mg/dL (ref 0–149)
VLDL Cholesterol Cal: 16 mg/dL (ref 5–40)

## 2020-12-14 LAB — CBC
Hematocrit: 37.7 % (ref 34.0–46.6)
Hemoglobin: 12.7 g/dL (ref 11.1–15.9)
MCH: 31 pg (ref 26.6–33.0)
MCHC: 33.7 g/dL (ref 31.5–35.7)
MCV: 92 fL (ref 79–97)
Platelets: 273 10*3/uL (ref 150–450)
RBC: 4.1 x10E6/uL (ref 3.77–5.28)
RDW: 12.6 % (ref 11.7–15.4)
WBC: 8.2 10*3/uL (ref 3.4–10.8)

## 2020-12-18 NOTE — Progress Notes (Signed)
Called pt made aware of NP results note and instructions. Verbalized understanding

## 2020-12-20 MED FILL — metFORMIN HCL ER 500 MG TB2: 500 | 90 days supply | Qty: 90 | Fill #1

## 2020-12-24 ENCOUNTER — Ambulatory Visit (INDEPENDENT_AMBULATORY_CARE_PROVIDER_SITE_OTHER): Payer: Self-pay | Admitting: Orthopaedic Surgery

## 2020-12-24 ENCOUNTER — Encounter: Payer: Self-pay | Admitting: Orthopaedic Surgery

## 2020-12-24 VITALS — Ht 62.0 in | Wt 157.0 lb

## 2020-12-24 DIAGNOSIS — G5601 Carpal tunnel syndrome, right upper limb: Secondary | ICD-10-CM

## 2020-12-24 DIAGNOSIS — G5602 Carpal tunnel syndrome, left upper limb: Secondary | ICD-10-CM

## 2020-12-24 NOTE — Progress Notes (Signed)
Office Visit Note   Patient: Carrie Buchanan           Date of Birth: 04-25-1976           MRN: [id number] Visit Date: 12/24/2020              Requested by: Claiborne Rigg, NP [address] [place],  [place] [address] PCP: Claiborne Rigg, NP   Assessment & Plan: Visit Diagnoses:  1. Right carpal tunnel syndrome   2. Left carpal tunnel syndrome     Plan: Impression is bilateral hand carpal tunnel syndrome.  We will provide her with carpal tunnel splints to wear at nighttime for now.  We will order nerve conduction studies as well.  She will follow-up after the nerve conduction studies.  She will also need to be evaluated for left arm and shoulder pain on return.  Today's encounter was performed through an interpreter.  Follow-Up Instructions: Return if symptoms worsen or fail to improve.   Orders:  No orders of the defined types were placed in this encounter.  No orders of the defined types were placed in this encounter.     Procedures: No procedures performed   Clinical Data: No additional findings.   Subjective: Chief Complaint  Patient presents with  . Right Hand - Pain, Numbness  . Left Hand - Pain, Numbness    Carrie Buchanan is a 45 year old female that I saw for a ganglion cyst over 3 years ago comes in for bilateral hand pain tingling and numbness for 3 months is worse at night.  She has pain and difficulty weakness with grasping gripping and lifting things.  Both hands are equally symptomatic.  She is left-handed and takes gabapentin which does not help significantly.  She works Education officer, environmental houses.   Review of Systems  Constitutional: Negative.   HENT: Negative.   Eyes: Negative.   Respiratory: Negative.   Cardiovascular: Negative.   Endocrine: Negative.   Musculoskeletal: Negative.   Neurological: Negative.   Hematological: Negative.   Psychiatric/Behavioral: Negative.   All other systems reviewed and are negative.    Objective: Vital  Signs: Ht 5\' 2"  (1.575 m)   Wt 157 lb (71.2 kg)   BMI 28.72 kg/m   Physical Exam Vitals and nursing note reviewed.  Constitutional:      Appearance: She is well-developed and well-nourished.  HENT:     Head: Normocephalic and atraumatic.  Eyes:     Extraocular Movements: EOM normal.  Pulmonary:     Effort: Pulmonary effort is normal.  Abdominal:     Palpations: Abdomen is soft.  Musculoskeletal:     Cervical back: Neck supple.  Skin:    General: Skin is warm.     Capillary Refill: Capillary refill takes less than 2 seconds.  Neurological:     Mental Status: She is alert and oriented to person, place, and time.  Psychiatric:        Mood and Affect: Mood and affect normal.        Behavior: Behavior normal.        Thought Content: Thought content normal.        Judgment: Judgment normal.     Ortho Exam Bilateral hands show no atrophy or contractures.  Positive carpal tunnel compressive signs.  Negative Tinel.  Wrist range of motion is normal.  She is able to make a full composite fist.  Specialty Comments:  No specialty comments available.  Imaging: No results found.  PMFS History: Patient Active Problem List   Diagnosis Date Noted  . Chronic pain syndrome 02/16/2020  . Impingement syndrome of right shoulder 08/02/2019  . Myofascial pain syndrome 08/02/2019  . IFG (impaired fasting glucose) 07/11/2014  . High triglycerides 07/11/2014  . Appendicitis, acute, with peritonitis 02/20/2013  . Twin pregnancy, delivered vaginally, current hospitalization 04/21/2012  . Multiparity 02/18/2012  . Short cervix, antepartum, twins 01/14/2012  . Twin pregnancy, antepartum 01/01/2012  . AMA (advanced maternal age) multigravida 35+ 01/01/2012  . Late prenatal care 01/01/2012  . High-risk pregnancy supervision 01/01/2012   Past Medical History:  Diagnosis Date  . Appendicitis, acute, with peritonitis 02/20/2013  . Medical history non-contributory   . Prediabetes      Family History  Problem Relation Age of Onset  . Asthma Mother     Past Surgical History:  Procedure Laterality Date  . APPENDECTOMY    . INTRAUTERINE DEVICE INSERTION  09/2012  . LAPAROSCOPIC APPENDECTOMY N/A 02/09/2013   Procedure: APPENDECTOMY LAPAROSCOPIC;  Surgeon: Emelia Loron, MD;  Location: [facility] OR;  Service: General;  Laterality: N/A;   Social History   Occupational History  . Not on file  Tobacco Use  . Smoking status: Never Smoker  . Smokeless tobacco: Never Used  Vaping Use  . Vaping Use: Never used  Substance and Sexual Activity  . Alcohol use: No  . Drug use: No  . Sexual activity: Not Currently    Birth control/protection: I.U.D.

## 2020-12-24 NOTE — Addendum Note (Signed)
Addended by: Wendi Maya on: 12/24/2020 10:12 AM   Modules accepted: Orders

## 2020-12-31 ENCOUNTER — Other Ambulatory Visit: Payer: Self-pay

## 2020-12-31 ENCOUNTER — Ambulatory Visit
Admission: RE | Admit: 2020-12-31 | Discharge: 2020-12-31 | Disposition: A | Discharge status: Home / Self Care | Payer: No Typology Code available for payment source | Source: Ambulatory Visit | Attending: Obstetrics & Gynecology | Admitting: Obstetrics & Gynecology

## 2020-12-31 DIAGNOSIS — Z1231 Encounter for screening mammogram for malignant neoplasm of breast: Secondary | ICD-10-CM

## 2021-02-07 ENCOUNTER — Encounter: Payer: Self-pay | Admitting: Physical Medicine and Rehabilitation

## 2021-02-07 ENCOUNTER — Other Ambulatory Visit: Payer: Self-pay

## 2021-02-07 ENCOUNTER — Ambulatory Visit (INDEPENDENT_AMBULATORY_CARE_PROVIDER_SITE_OTHER): Payer: Self-pay | Admitting: Physical Medicine and Rehabilitation

## 2021-02-07 DIAGNOSIS — R202 Paresthesia of skin: Secondary | ICD-10-CM

## 2021-02-07 NOTE — Progress Notes (Signed)
Left upper arm pain. Tingling in hands not as bad now.  Left hand dominant + Lotion Numeric Pain Rating Scale and Functional Assessment Average Pain 7   In the last MONTH (on 0-10 scale) has pain interfered with the following?  1. General activity like being  able to carry out your everyday physical activities such as walking, climbing stairs, carrying groceries, or moving a chair?  Rating(5)

## 2021-02-07 NOTE — Progress Notes (Signed)
[facility] Carrie Buchanan - 45 y.o. female MRN [id number]  Date of birth: 02-07-1976  Office Visit Note: Visit Date: 02/07/2021 PCP: Claiborne Rigg, NP Referred by: Claiborne Rigg, NP  Subjective: Chief Complaint  Patient presents with  . Left Upper Arm - Pain  . Right Hand - Numbness  . Left Hand - Numbness   HPI:  [facility] Carrie Buchanan is a 45 y.o. female who comes in today at the request of Dr. Glee Arvin for electrodiagnostic study of the Bilateral upper extremities.  Patient is Left hand dominant.  She reports 7 out of 10 pain with numbness and tingling particularly in the left hand and radiating up into the left arm and lateral shoulder.  She has had some symptoms on the right but the left is much more dominant symptoms at this point and she is left-handed.  She denies any frank radicular symptoms.  She denies any prior electrodiagnostic studies.  She does have a history of prediabetes.   ROS Otherwise per HPI.  Assessment & Plan: Visit Diagnoses:    ICD-10-CM   1. Paresthesia of skin  R20.2 NCV with EMG (electromyography)    Plan: Impression: The above electrodiagnostic study is ABNORMAL and reveals evidence of a moderate bilateral Left > Right median nerve entrapment at the wrist (carpal tunnel syndrome) affecting sensory and motor components. There is also incidental finding of bilateral Martin-Gruber anastomoses which is a normal variant.   There is no significant electrodiagnostic evidence of any other focal nerve entrapment, brachial plexopathy or cervical radiculopathy.   Recommendations: 1.  Follow-up with referring physician. 2.  Continue current management of symptoms. 3.  Continue use of resting splint at night-time and as needed during the day. 4.  Suggest surgical evaluation.    Meds & Orders: No orders of the defined types were placed in this encounter.   Orders Placed This Encounter  Procedures  . NCV with EMG (electromyography)     Follow-up: Return in about 2 weeks (around 02/21/2021) for Glee Arvin, MD.   Procedures: No procedures performed  EMG & NCV Findings: Evaluation of the left median motor nerve showed prolonged distal onset latency (4.8 ms) and decreased conduction velocity (Elbow-Wrist, 38 m/s).  The right median motor nerve showed prolonged distal onset latency (4.5 ms), reduced amplitude (4.7 mV), and decreased conduction velocity (Elbow-Wrist, 46 m/s).  The left median (across palm) sensory nerve showed prolonged distal peak latency (Wrist, 5.3 ms) and prolonged distal peak latency (Palm, 6.3 ms).  The right median (across palm) sensory nerve showed prolonged distal peak latency (Wrist, 4.0 ms).  All remaining nerves (as indicated in the following tables) were within normal limits.  All left vs. right side differences were within normal limits.    All examined muscles (as indicated in the following table) showed no evidence of electrical instability.    Impression: The above electrodiagnostic study is ABNORMAL and reveals evidence of a moderate bilateral Left > Right median nerve entrapment at the wrist (carpal tunnel syndrome) affecting sensory and motor components. There is also incidental finding of bilateral Martin-Gruber anastomoses which is a normal variant.   There is no significant electrodiagnostic evidence of any other focal nerve entrapment, brachial plexopathy or cervical radiculopathy.   Recommendations: 1.  Follow-up with referring physician. 2.  Continue current management of symptoms. 3.  Continue use of resting splint at night-time and as needed during the day. 4.  Suggest surgical evaluation.  ___________________________ Naaman Plummer [facility] Board Certified,  Biomedical engineer of Physical Medicine and Rehabilitation    Nerve Conduction Studies Anti Sensory Summary Table   Stim Site NR Peak (ms) Norm Peak (ms) P-T Amp (V) Norm P-T Amp Site1 Site2 Delta-P (ms) Dist (cm) Vel (m/s) Norm Vel  (m/s)  Left Median Acr Palm Anti Sensory (2nd Digit)  32.2C  Wrist    *5.3 <3.6 21.0 >10 Wrist Palm 1.0 0.0    Palm    *6.3 <2.0 11.2         Right Median Acr Palm Anti Sensory (2nd Digit)  31.8C  Wrist    *4.0 <3.6 26.8 >10 Wrist Palm 2.3 0.0    Palm    1.7 <2.0 5.2         Left Radial Anti Sensory (Base 1st Digit)  32.2C  Wrist    1.7 <3.1 40.7  Wrist Base 1st Digit 1.7 0.0    Left Ulnar Anti Sensory (5th Digit)  32.3C  Wrist    3.0 <3.7 18.4 >15.0 Wrist 5th Digit 3.0 14.0 47 >38   Motor Summary Table   Stim Site NR Onset (ms) Norm Onset (ms) O-P Amp (mV) Norm O-P Amp Site1 Site2 Delta-0 (ms) Dist (cm) Vel (m/s) Norm Vel (m/s)  Left Median Motor (Abd Poll Brev)  32.2C    Martin-Gruber  Wrist    *4.8 <4.2 5.7 >5 Elbow Wrist 4.5 17.0 *38 >50  Elbow    9.3  4.7         Right Median Motor (Abd Poll Brev)  32.2C    Martin-Gruber  Wrist    *4.5 <4.2 *4.7 >5 Elbow Wrist 3.8 17.5 *46 >50  Elbow    8.3  6.5         Left Ulnar Motor (Abd Dig Min)  32.2C  Wrist    2.6 <4.2 12.6 >3 B Elbow Wrist 2.5 17.0 68 >53  B Elbow    5.1  12.5  A Elbow B Elbow 1.0 10.0 100 >53  A Elbow    6.1  12.1          EMG   Side Muscle Nerve Root Ins Act Fibs Psw Amp Dur Poly Recrt Int Dennie Bible Comment  Left Abd Poll Brev Median C8-T1 Nml Nml Nml Nml Nml 0 Nml Nml   Left 1stDorInt Ulnar C8-T1 Nml Nml Nml Nml Nml 0 Nml Nml   Left PronatorTeres Median C6-7 Nml Nml Nml Nml Nml 0 Nml Nml     Nerve Conduction Studies Anti Sensory Left/Right Comparison   Stim Site L Lat (ms) R Lat (ms) L-R Lat (ms) L Amp (V) R Amp (V) L-R Amp (%) Site1 Site2 L Vel (m/s) R Vel (m/s) L-R Vel (m/s)  Median Acr Palm Anti Sensory (2nd Digit)  32.2C  Wrist *5.3 *4.0 1.3 21.0 26.8 21.6 Wrist Palm     Palm *6.3 1.7 4.6 11.2 5.2 53.6       Radial Anti Sensory (Base 1st Digit)  32.2C  Wrist 1.7   40.7   Wrist Base 1st Digit     Ulnar Anti Sensory (5th Digit)  32.3C  Wrist 3.0   18.4   Wrist 5th Digit 47     Motor Left/Right  Comparison   Stim Site L Lat (ms) R Lat (ms) L-R Lat (ms) L Amp (mV) R Amp (mV) L-R Amp (%) Site1 Site2 L Vel (m/s) R Vel (m/s) L-R Vel (m/s)  Median Motor (Abd Poll Brev)  32.2C    Martin-Gruber  Wrist *4.8 *4.5  0.3 5.7 *4.7 17.5 Elbow Wrist *38 *46 8  Elbow 9.3 8.3 1.0 4.7 6.5 27.7       Ulnar Motor (Abd Dig Min)  32.2C  Wrist 2.6   12.6   B Elbow Wrist 68    B Elbow 5.1   12.5   A Elbow B Elbow 100    A Elbow 6.1   12.1            Waveforms:                 Clinical History: No specialty comments available.     Objective:  VS:  HT:    WT:   BMI:     BP:   HR: bpm  TEMP: ( )  RESP:  Physical Exam Musculoskeletal:        General: No swelling, tenderness or deformity.     Comments: Inspection reveals no atrophy of the bilateral APB or FDI or hand intrinsics. There is no swelling, color changes, allodynia or dystrophic changes. There is 5 out of 5 strength in the bilateral wrist extension, finger abduction and long finger flexion. There is intact sensation to light touch in all dermatomal and peripheral nerve distributions.  There is a positive. Phalen's test bilaterally. There is a negative Hoffmann's test bilaterally.  Skin:    General: Skin is warm and dry.     Findings: No erythema or rash.  Neurological:     General: No focal deficit present.     Mental Status: She is alert and oriented to person, place, and time.     Motor: No weakness or abnormal muscle tone.     Coordination: Coordination normal.  Psychiatric:        Mood and Affect: Mood normal.        Behavior: Behavior normal.      Imaging: No results found.

## 2021-02-07 NOTE — Procedures (Signed)
EMG & NCV Findings: Evaluation of the left median motor nerve showed prolonged distal onset latency (4.8 ms) and decreased conduction velocity (Elbow-Wrist, 38 m/s).  The right median motor nerve showed prolonged distal onset latency (4.5 ms), reduced amplitude (4.7 mV), and decreased conduction velocity (Elbow-Wrist, 46 m/s).  The left median (across palm) sensory nerve showed prolonged distal peak latency (Wrist, 5.3 ms) and prolonged distal peak latency (Palm, 6.3 ms).  The right median (across palm) sensory nerve showed prolonged distal peak latency (Wrist, 4.0 ms).  All remaining nerves (as indicated in the following tables) were within normal limits.  All left vs. right side differences were within normal limits.    All examined muscles (as indicated in the following table) showed no evidence of electrical instability.    Impression: The above electrodiagnostic study is ABNORMAL and reveals evidence of a moderate bilateral Left > Right median nerve entrapment at the wrist (carpal tunnel syndrome) affecting sensory and motor components. There is also incidental finding of bilateral Martin-Gruber anastomoses which is a normal variant.   There is no significant electrodiagnostic evidence of any other focal nerve entrapment, brachial plexopathy or cervical radiculopathy.   Recommendations: 1.  Follow-up with referring physician. 2.  Continue current management of symptoms. 3.  Continue use of resting splint at night-time and as needed during the day. 4.  Suggest surgical evaluation.  ___________________________ Naaman Plummer FAAPMR Board Certified, American Board of Physical Medicine and Rehabilitation    Nerve Conduction Studies Anti Sensory Summary Table   Stim Site NR Peak (ms) Norm Peak (ms) P-T Amp (V) Norm P-T Amp Site1 Site2 Delta-P (ms) Dist (cm) Vel (m/s) Norm Vel (m/s)  Left Median Acr Palm Anti Sensory (2nd Digit)  32.2C  Wrist    *5.3 <3.6 21.0 >10 Wrist Palm 1.0 0.0    Palm     *6.3 <2.0 11.2         Right Median Acr Palm Anti Sensory (2nd Digit)  31.8C  Wrist    *4.0 <3.6 26.8 >10 Wrist Palm 2.3 0.0    Palm    1.7 <2.0 5.2         Left Radial Anti Sensory (Base 1st Digit)  32.2C  Wrist    1.7 <3.1 40.7  Wrist Base 1st Digit 1.7 0.0    Left Ulnar Anti Sensory (5th Digit)  32.3C  Wrist    3.0 <3.7 18.4 >15.0 Wrist 5th Digit 3.0 14.0 47 >38   Motor Summary Table   Stim Site NR Onset (ms) Norm Onset (ms) O-P Amp (mV) Norm O-P Amp Site1 Site2 Delta-0 (ms) Dist (cm) Vel (m/s) Norm Vel (m/s)  Left Median Motor (Abd Poll Brev)  32.2C    Martin-Gruber  Wrist    *4.8 <4.2 5.7 >5 Elbow Wrist 4.5 17.0 *38 >50  Elbow    9.3  4.7         Right Median Motor (Abd Poll Brev)  32.2C    Martin-Gruber  Wrist    *4.5 <4.2 *4.7 >5 Elbow Wrist 3.8 17.5 *46 >50  Elbow    8.3  6.5         Left Ulnar Motor (Abd Dig Min)  32.2C  Wrist    2.6 <4.2 12.6 >3 B Elbow Wrist 2.5 17.0 68 >53  B Elbow    5.1  12.5  A Elbow B Elbow 1.0 10.0 100 >53  A Elbow    6.1  12.1  EMG   Side Muscle Nerve Root Ins Act Fibs Psw Amp Dur Poly Recrt Int Dennie Bible Comment  Left Abd Poll Brev Median C8-T1 Nml Nml Nml Nml Nml 0 Nml Nml   Left 1stDorInt Ulnar C8-T1 Nml Nml Nml Nml Nml 0 Nml Nml   Left PronatorTeres Median C6-7 Nml Nml Nml Nml Nml 0 Nml Nml     Nerve Conduction Studies Anti Sensory Left/Right Comparison   Stim Site L Lat (ms) R Lat (ms) L-R Lat (ms) L Amp (V) R Amp (V) L-R Amp (%) Site1 Site2 L Vel (m/s) R Vel (m/s) L-R Vel (m/s)  Median Acr Palm Anti Sensory (2nd Digit)  32.2C  Wrist *5.3 *4.0 1.3 21.0 26.8 21.6 Wrist Palm     Palm *6.3 1.7 4.6 11.2 5.2 53.6       Radial Anti Sensory (Base 1st Digit)  32.2C  Wrist 1.7   40.7   Wrist Base 1st Digit     Ulnar Anti Sensory (5th Digit)  32.3C  Wrist 3.0   18.4   Wrist 5th Digit 47     Motor Left/Right Comparison   Stim Site L Lat (ms) R Lat (ms) L-R Lat (ms) L Amp (mV) R Amp (mV) L-R Amp (%) Site1 Site2 L Vel (m/s) R Vel  (m/s) L-R Vel (m/s)  Median Motor (Abd Poll Brev)  32.2C    Martin-Gruber  Wrist *4.8 *4.5 0.3 5.7 *4.7 17.5 Elbow Wrist *38 *46 8  Elbow 9.3 8.3 1.0 4.7 6.5 27.7       Ulnar Motor (Abd Dig Min)  32.2C  Wrist 2.6   12.6   B Elbow Wrist 68    B Elbow 5.1   12.5   A Elbow B Elbow 100    A Elbow 6.1   12.1            Waveforms:

## 2021-02-21 ENCOUNTER — Encounter: Payer: Self-pay | Admitting: Orthopaedic Surgery

## 2021-02-21 ENCOUNTER — Other Ambulatory Visit: Payer: Self-pay

## 2021-02-21 ENCOUNTER — Ambulatory Visit (INDEPENDENT_AMBULATORY_CARE_PROVIDER_SITE_OTHER): Payer: Self-pay | Admitting: Orthopaedic Surgery

## 2021-02-21 VITALS — Ht 62.0 in | Wt 157.0 lb

## 2021-02-21 DIAGNOSIS — G5601 Carpal tunnel syndrome, right upper limb: Secondary | ICD-10-CM

## 2021-02-21 DIAGNOSIS — G5602 Carpal tunnel syndrome, left upper limb: Secondary | ICD-10-CM

## 2021-02-21 NOTE — Addendum Note (Signed)
Addended by: Mayra Reel on: 02/21/2021 08:29 AM   Modules accepted: Level of Service

## 2021-02-21 NOTE — Progress Notes (Signed)
Office Visit Note   Patient: Carrie Buchanan           Date of Birth: 17-Feb-1976           MRN: [id number] Visit Date: 02/21/2021              Requested by: Claiborne Rigg, NP [address] [place],  [place] [address] PCP: Claiborne Rigg, NP   Assessment & Plan: Visit Diagnoses:  1. Left carpal tunnel syndrome   2. Right carpal tunnel syndrome     Plan: Nerve conduction studies show left greater than right moderate carpal tunnel syndrome.  These findings were reviewed with the patient in detail and treatment options were discussed to include surgical release, cortisone injection, continued bracing.  I have recommended surgical release based on our discussion of her symptoms and dysfunction that it causes to her that her hands.  Risk benefits rehab recovery reviewed with the patient detail.  She will look to get this done in June.  My surgery scheduler Eunice Blase was able to meet with her today.  Spanish interpreter was present during the entire encounter.  Follow-Up Instructions: Return if symptoms worsen or fail to improve.   Orders:  No orders of the defined types were placed in this encounter.  No orders of the defined types were placed in this encounter.     Procedures: No procedures performed   Clinical Data: No additional findings.   Subjective: Chief Complaint  Patient presents with  . Left Hand - Follow-up    EMG review  . Right Hand - Follow-up    EMG review    Carrie Buchanan returns today for follow-up of recent nerve conduction studies evaluating for carpal tunnel syndrome.  She denies any changes in symptoms.  Nerve conduction studies are positive for a moderate left greater than right carpal tunnel syndrome.  Language interpreter present today.   Review of Systems  Constitutional: Negative.   HENT: Negative.   Eyes: Negative.   Respiratory: Negative.   Cardiovascular: Negative.   Endocrine: Negative.   Musculoskeletal: Negative.    Neurological: Negative.   Hematological: Negative.   Psychiatric/Behavioral: Negative.   All other systems reviewed and are negative.    Objective: Vital Signs: Ht 5\' 2"  (1.575 m)   Wt 157 lb (71.2 kg)   BMI 28.72 kg/m   Physical Exam Vitals and nursing note reviewed.  Constitutional:      Appearance: She is well-developed.  Pulmonary:     Effort: Pulmonary effort is normal.  Skin:    General: Skin is warm.     Capillary Refill: Capillary refill takes less than 2 seconds.  Neurological:     Mental Status: She is alert and oriented to person, place, and time.  Psychiatric:        Behavior: Behavior normal.        Thought Content: Thought content normal.        Judgment: Judgment normal.     Ortho Exam Bilateral hand exams are unchanged. Specialty Comments:  No specialty comments available.  Imaging: No results found.   PMFS History: Patient Active Problem List   Diagnosis Date Noted  . Chronic pain syndrome 02/16/2020  . Impingement syndrome of right shoulder 08/02/2019  . Myofascial pain syndrome 08/02/2019  . IFG (impaired fasting glucose) 07/11/2014  . High triglycerides 07/11/2014  . Appendicitis, acute, with peritonitis 02/20/2013  . Twin pregnancy, delivered vaginally, current hospitalization 04/21/2012  . Multiparity 02/18/2012  . Short cervix,  antepartum, twins 01/14/2012  . Twin pregnancy, antepartum 01/01/2012  . AMA (advanced maternal age) multigravida 35+ 01/01/2012  . Late prenatal care 01/01/2012  . High-risk pregnancy supervision 01/01/2012   Past Medical History:  Diagnosis Date  . Appendicitis, acute, with peritonitis 02/20/2013  . Medical history non-contributory   . Prediabetes     Family History  Problem Relation Age of Onset  . Asthma Mother     Past Surgical History:  Procedure Laterality Date  . APPENDECTOMY    . INTRAUTERINE DEVICE INSERTION  09/2012  . LAPAROSCOPIC APPENDECTOMY N/A 02/09/2013   Procedure: APPENDECTOMY  LAPAROSCOPIC;  Surgeon: Emelia Loron, MD;  Location: [facility] OR;  Service: General;  Laterality: N/A;   Social History   Occupational History  . Not on file  Tobacco Use  . Smoking status: Never Smoker  . Smokeless tobacco: Never Used  Vaping Use  . Vaping Use: Never used  Substance and Sexual Activity  . Alcohol use: No  . Drug use: No  . Sexual activity: Not Currently    Birth control/protection: I.U.D.

## 2021-04-01 ENCOUNTER — Encounter (HOSPITAL_BASED_OUTPATIENT_CLINIC_OR_DEPARTMENT_OTHER): Payer: Self-pay | Admitting: Orthopaedic Surgery

## 2021-04-01 ENCOUNTER — Other Ambulatory Visit: Payer: Self-pay

## 2021-04-02 ENCOUNTER — Ambulatory Visit: Payer: Self-pay | Attending: Nurse Practitioner

## 2021-04-07 ENCOUNTER — Encounter (HOSPITAL_BASED_OUTPATIENT_CLINIC_OR_DEPARTMENT_OTHER)
Admission: RE | Admit: 2021-04-07 | Discharge: 2021-04-07 | Disposition: A | Discharge status: Home / Self Care | Payer: Self-pay | Source: Ambulatory Visit | Attending: Orthopaedic Surgery | Admitting: Orthopaedic Surgery

## 2021-04-07 ENCOUNTER — Other Ambulatory Visit (HOSPITAL_COMMUNITY)
Admission: RE | Admit: 2021-04-07 | Discharge: 2021-04-07 | Disposition: A | Discharge status: Home / Self Care | Payer: Self-pay | Source: Ambulatory Visit | Attending: Orthopaedic Surgery | Admitting: Orthopaedic Surgery

## 2021-04-07 DIAGNOSIS — Z01818 Encounter for other preprocedural examination: Secondary | ICD-10-CM | POA: Insufficient documentation

## 2021-04-07 DIAGNOSIS — Z20822 Contact with and (suspected) exposure to covid-19: Secondary | ICD-10-CM | POA: Insufficient documentation

## 2021-04-07 DIAGNOSIS — Z01812 Encounter for preprocedural laboratory examination: Secondary | ICD-10-CM | POA: Insufficient documentation

## 2021-04-07 LAB — BASIC METABOLIC PANEL
Anion gap: 8 (ref 5–15)
BUN: 15 mg/dL (ref 6–20)
CO2: 25 mmol/L (ref 22–32)
Calcium: 9.2 mg/dL (ref 8.9–10.3)
Chloride: 105 mmol/L (ref 98–111)
Creatinine, Ser: 0.72 mg/dL (ref 0.44–1.00)
GFR, Estimated: >60 mL/min (ref >60)
Glucose, Bld: 183 mg/dL — ABNORMAL HIGH (ref 70–99)
Potassium: 4.3 mmol/L (ref 3.5–5.1)
Sodium: 138 mmol/L (ref 135–145)

## 2021-04-07 LAB — POCT PREGNANCY, URINE: Preg Test, Ur: NEGATIVE

## 2021-04-07 LAB — SARS CORONAVIRUS 2 (TAT 6-24 HRS): SARS Coronavirus 2: NEGATIVE

## 2021-04-07 NOTE — Progress Notes (Signed)

## 2021-04-09 ENCOUNTER — Encounter (HOSPITAL_BASED_OUTPATIENT_CLINIC_OR_DEPARTMENT_OTHER)
Admission: RE | Disposition: A | Discharge status: Home / Self Care | Payer: Self-pay | Source: Home / Self Care | Attending: Orthopaedic Surgery

## 2021-04-09 ENCOUNTER — Ambulatory Visit (HOSPITAL_BASED_OUTPATIENT_CLINIC_OR_DEPARTMENT_OTHER)
Admission: RE | Admit: 2021-04-09 | Discharge: 2021-04-09 | Disposition: A | Discharge status: Home / Self Care | Payer: Self-pay | Attending: Orthopaedic Surgery | Admitting: Orthopaedic Surgery

## 2021-04-09 ENCOUNTER — Encounter (HOSPITAL_BASED_OUTPATIENT_CLINIC_OR_DEPARTMENT_OTHER): Payer: Self-pay | Admitting: Orthopaedic Surgery

## 2021-04-09 ENCOUNTER — Other Ambulatory Visit: Payer: Self-pay

## 2021-04-09 ENCOUNTER — Ambulatory Visit (HOSPITAL_BASED_OUTPATIENT_CLINIC_OR_DEPARTMENT_OTHER): Payer: Self-pay | Admitting: Anesthesiology

## 2021-04-09 DIAGNOSIS — Z79899 Other long term (current) drug therapy: Secondary | ICD-10-CM | POA: Insufficient documentation

## 2021-04-09 DIAGNOSIS — G5602 Carpal tunnel syndrome, left upper limb: Secondary | ICD-10-CM | POA: Insufficient documentation

## 2021-04-09 DIAGNOSIS — Z7984 Long term (current) use of oral hypoglycemic drugs: Secondary | ICD-10-CM | POA: Insufficient documentation

## 2021-04-09 HISTORY — PX: CARPAL TUNNEL RELEASE: SHX101

## 2021-04-09 LAB — GLUCOSE, CAPILLARY
Glucose-Capillary: 121 mg/dL — ABNORMAL HIGH (ref 70–99)
Glucose-Capillary: 102 mg/dL — ABNORMAL HIGH (ref 70–99)

## 2021-04-09 LAB — POCT PREGNANCY, URINE: Preg Test, Ur: NEGATIVE

## 2021-04-09 SURGERY — CARPAL TUNNEL RELEASE
Anesthesia: Monitor Anesthesia Care | Site: Wrist | Laterality: Left

## 2021-04-09 MED ORDER — HYDROCODONE-ACETAMINOPHEN 5-325 MG PO TABS: 1.0000 | ORAL_TABLET | Freq: Four times a day (QID) | ORAL | 0 refills | Status: DC | PRN

## 2021-04-09 MED ORDER — FENTANYL CITRATE (PF) 100 MCG/2ML IJ SOLN: 25.0000 ug | INTRAMUSCULAR | Status: DC | PRN

## 2021-04-09 MED ORDER — OXYCODONE HCL 5 MG PO TABS
5.0000 mg | ORAL_TABLET | Freq: Once | ORAL | Status: AC | PRN
  Administered 2021-04-09: 5 mg via ORAL

## 2021-04-09 MED ORDER — BUPIVACAINE HCL (PF) 0.25 % IJ SOLN
INTRAMUSCULAR | Status: DC | PRN
  Administered 2021-04-09: 6 mL

## 2021-04-09 MED ORDER — LIDOCAINE HCL (PF) 0.5 % IJ SOLN
INTRAMUSCULAR | Status: DC | PRN
  Administered 2021-04-09: 30 mL via INTRAVENOUS

## 2021-04-09 MED ORDER — MIDAZOLAM HCL 2 MG/2ML IJ SOLN
INTRAMUSCULAR | Status: AC
  Filled 2021-04-09: qty 2

## 2021-04-09 MED ORDER — OXYCODONE HCL 5 MG PO TABS
ORAL_TABLET | ORAL | Status: AC
  Filled 2021-04-09: qty 1

## 2021-04-09 MED ORDER — LACTATED RINGERS IV SOLN: INTRAVENOUS | Status: DC

## 2021-04-09 MED ORDER — OXYCODONE HCL 5 MG/5ML PO SOLN: 5.0000 mg | Freq: Once | ORAL | Status: AC | PRN

## 2021-04-09 MED ORDER — CEFAZOLIN SODIUM-DEXTROSE 2-4 GM/100ML-% IV SOLN
INTRAVENOUS | Status: AC
  Filled 2021-04-09: qty 100

## 2021-04-09 MED ORDER — CEFAZOLIN SODIUM-DEXTROSE 2-4 GM/100ML-% IV SOLN
2.0000 g | INTRAVENOUS | Status: AC
  Administered 2021-04-09: 12:00:00 2 g via INTRAVENOUS

## 2021-04-09 MED ORDER — FENTANYL CITRATE (PF) 100 MCG/2ML IJ SOLN
INTRAMUSCULAR | Status: DC | PRN
  Administered 2021-04-09 (×2): 50 ug via INTRAVENOUS

## 2021-04-09 MED ORDER — ONDANSETRON HCL 4 MG/2ML IJ SOLN: 4.0000 mg | Freq: Once | INTRAMUSCULAR | Status: DC | PRN

## 2021-04-09 MED ORDER — PROPOFOL 500 MG/50ML IV EMUL
INTRAVENOUS | Status: DC | PRN
  Administered 2021-04-09: 75 ug/kg/min via INTRAVENOUS

## 2021-04-09 MED ORDER — BUPIVACAINE HCL (PF) 0.25 % IJ SOLN
INTRAMUSCULAR | Status: AC
  Filled 2021-04-09: qty 30

## 2021-04-09 MED ORDER — DEXMEDETOMIDINE (PRECEDEX) IN NS 20 MCG/5ML (4 MCG/ML) IV SYRINGE
PREFILLED_SYRINGE | INTRAVENOUS | Status: DC | PRN
  Administered 2021-04-09: 8 ug via INTRAVENOUS

## 2021-04-09 MED ORDER — 0.9 % SODIUM CHLORIDE (POUR BTL) OPTIME
TOPICAL | Status: DC | PRN
  Administered 2021-04-09: 200 mL

## 2021-04-09 MED ORDER — FENTANYL CITRATE (PF) 100 MCG/2ML IJ SOLN
INTRAMUSCULAR | Status: AC
  Filled 2021-04-09: qty 2

## 2021-04-09 MED ORDER — MIDAZOLAM HCL 5 MG/5ML IJ SOLN
INTRAMUSCULAR | Status: DC | PRN
  Administered 2021-04-09: 2 mg via INTRAVENOUS

## 2021-04-09 MED ORDER — ONDANSETRON HCL 4 MG/2ML IJ SOLN
INTRAMUSCULAR | Status: DC | PRN
  Administered 2021-04-09: 4 mg via INTRAVENOUS

## 2021-04-09 SURGICAL SUPPLY — 51 items
BAND INSRT 18 STRL LF DISP RB (MISCELLANEOUS) ×2
BAND RUBBER #18 3X1/16 STRL (MISCELLANEOUS) ×6 IMPLANT
BLADE MINI RND TIP GREEN BEAV (BLADE) ×1 IMPLANT
BLADE SURG 15 STRL LF DISP TIS (BLADE) ×1 IMPLANT
BLADE SURG 15 STRL SS (BLADE) ×3
BNDG CMPR 9X4 STRL LF SNTH (GAUZE/BANDAGES/DRESSINGS) ×1
BNDG ELASTIC 3X5.8 VLCR STR LF (GAUZE/BANDAGES/DRESSINGS) ×3 IMPLANT
BNDG ESMARK 4X9 LF (GAUZE/BANDAGES/DRESSINGS) ×3 IMPLANT
BNDG PLASTER X FAST 3X3 WHT LF (CAST SUPPLIES) IMPLANT
BNDG PLSTR 9X3 FST ST WHT (CAST SUPPLIES)
BRUSH SCRUB EZ PLAIN DRY (MISCELLANEOUS) ×3 IMPLANT
CANISTER SUCT 1200ML W/VALVE (MISCELLANEOUS) ×3 IMPLANT
CORD BIPOLAR FORCEPS 12FT (ELECTRODE) ×3 IMPLANT
COVER BACK TABLE 60X90IN (DRAPES) ×3 IMPLANT
COVER MAYO STAND STRL (DRAPES) ×3 IMPLANT
COVER WAND RF STERILE (DRAPES) IMPLANT
CUFF TOURN SGL QUICK 18X4 (TOURNIQUET CUFF) ×2 IMPLANT
DECANTER SPIKE VIAL GLASS SM (MISCELLANEOUS) IMPLANT
DRAPE EXTREMITY T 121X128X90 (DISPOSABLE) ×3 IMPLANT
DRAPE IMP U-DRAPE 54X76 (DRAPES) ×3 IMPLANT
DRAPE SURG 17X23 STRL (DRAPES) ×3 IMPLANT
GAUZE 4X4 16PLY RFD (DISPOSABLE) ×2 IMPLANT
GAUZE SPONGE 4X4 12PLY STRL (GAUZE/BANDAGES/DRESSINGS) ×3 IMPLANT
GAUZE XEROFORM 1X8 LF (GAUZE/BANDAGES/DRESSINGS) ×3 IMPLANT
GLOVE SURG ENC MOIS LTX SZ6.5 (GLOVE) ×2 IMPLANT
GLOVE SURG ENC MOIS LTX SZ7 (GLOVE) ×2 IMPLANT
GLOVE SURG LTX SZ7 (GLOVE) ×5 IMPLANT
GLOVE SURG NEOP MICRO LF SZ7.5 (GLOVE) ×3 IMPLANT
GLOVE SURG SYN 7.5  E (GLOVE) ×6
GLOVE SURG SYN 7.5 E (GLOVE) ×2 IMPLANT
GLOVE SURG SYN 7.5 PF PI (GLOVE) ×1 IMPLANT
GLOVE SURG UNDER POLY LF SZ7 (GLOVE) ×5 IMPLANT
GOWN STRL REIN XL XLG (GOWN DISPOSABLE) ×6 IMPLANT
GOWN STRL REUS W/ TWL LRG LVL3 (GOWN DISPOSABLE) ×1 IMPLANT
GOWN STRL REUS W/TWL LRG LVL3 (GOWN DISPOSABLE) ×6
NDL HYPO 25X1 1.5 SAFETY (NEEDLE) IMPLANT
NEEDLE HYPO 25X1 1.5 SAFETY (NEEDLE) ×3 IMPLANT
NS IRRIG 1000ML POUR BTL (IV SOLUTION) ×3 IMPLANT
PACK BASIN DAY SURGERY FS (CUSTOM PROCEDURE TRAY) ×3 IMPLANT
PAD CAST 3X4 CTTN HI CHSV (CAST SUPPLIES) ×1 IMPLANT
PADDING CAST COTTON 3X4 STRL (CAST SUPPLIES) ×3
SHEET MEDIUM DRAPE 40X70 STRL (DRAPES) ×3 IMPLANT
STOCKINETTE 4X48 STRL (DRAPES) ×3 IMPLANT
SUT ETHILON 3 0 PS 1 (SUTURE) ×3 IMPLANT
SYR BULB EAR ULCER 3OZ GRN STR (SYRINGE) ×3 IMPLANT
SYR CONTROL 10ML LL (SYRINGE) ×2 IMPLANT
TOWEL GREEN STERILE FF (TOWEL DISPOSABLE) ×3 IMPLANT
TRAY DSU PREP LF (CUSTOM PROCEDURE TRAY) ×3 IMPLANT
TUBE CONNECTING 20'X1/4 (TUBING)
TUBE CONNECTING 20X1/4 (TUBING) IMPLANT
UNDERPAD 30X36 HEAVY ABSORB (UNDERPADS AND DIAPERS) ×3 IMPLANT

## 2021-04-09 NOTE — Anesthesia Procedure Notes (Signed)
Anesthesia Regional Block: Bier block (IV Regional)   Pre-Anesthetic Checklist: ,, timeout performed, Correct Patient, Correct Site, Correct Laterality, Correct Procedure,, site marked, surgical consent,, at surgeon's request  Laterality: Left     Needles:  Injection technique: Single-shot  Needle Type: Other      Needle Gauge: 20     Additional Needles:   Procedures:,,,,, intact distal pulses, Esmarch exsanguination, single tourniquet utilized,  Narrative:   Performed by: Personally       

## 2021-04-09 NOTE — H&P (Signed)
PREOPERATIVE H&P  Chief Complaint: LEFT CARPAL TUNNEL SYNDROME  HPI: Carrie Buchanan is a 45 y.o. female who presents for surgical treatment of LEFT CARPAL TUNNEL SYNDROME.  She denies any changes in medical history.  Past Medical History:  Diagnosis Date  . Appendicitis, acute, with peritonitis 02/20/2013  . Prediabetes    Past Surgical History:  Procedure Laterality Date  . APPENDECTOMY    . INTRAUTERINE DEVICE INSERTION  09/2012  . LAPAROSCOPIC APPENDECTOMY N/A 02/09/2013   Procedure: APPENDECTOMY LAPAROSCOPIC;  Surgeon: Emelia Loron, MD;  Location: [facility] OR;  Service: General;  Laterality: N/A;   Social History   Socioeconomic History  . Marital status: Married    Spouse name: Not on file  . Number of children: Not on file  . Years of education: Not on file  . Highest education level: Not on file  Occupational History  . Not on file  Tobacco Use  . Smoking status: Never Smoker  . Smokeless tobacco: Never Used  Vaping Use  . Vaping Use: Never used  Substance and Sexual Activity  . Alcohol use: No  . Drug use: No  . Sexual activity: Not Currently  Other Topics Concern  . Not on file  Social History Narrative  . Not on file   Social Determinants of Health   Financial Resource Strain: Not on file  Food Insecurity: Not on file  Transportation Needs: Not on file  Physical Activity: Not on file  Stress: Not on file  Social Connections: Not on file   Family History  Problem Relation Age of Onset  . Asthma Mother    No Known Allergies Prior to Admission medications   Medication Sig Start Date End Date Taking? Authorizing Provider  Cholecalciferol (VITAMIN D3) 2000 units CHEW Chew by mouth.   Yes [provider]  gabapentin (NEURONTIN) 300 MG capsule TAKE 1 CAPSULE (300 MG TOTAL) BY MOUTH 3 (THREE) TIMES DAILY. 12/13/20 12/13/21 Yes Claiborne Rigg, NP  metFORMIN (GLUCOPHAGE-XR) 500 MG 24 hr tablet TAKE 1 TABLET (500 MG TOTAL) BY  MOUTH DAILY WITH BREAKFAST. Patient taking differently: Take 500 mg by mouth daily with breakfast. 02/06/20 02/05/21 Yes Claiborne Rigg, NP  cyclobenzaprine (FLEXERIL) 5 MG tablet Take 1 tablet (5 mg total) by mouth 3 (three) times daily as needed for muscle spasms. 02/06/20   Claiborne Rigg, NP  etonogestrel (NEXPLANON) 68 MG IMPL implant 1 each by Subdermal route once.    [provider]     Positive ROS: All other systems have been reviewed and were otherwise negative with the exception of those mentioned in the HPI and as above.  Physical Exam: General: Alert, no acute distress Cardiovascular: No pedal edema Respiratory: No cyanosis, no use of accessory musculature GI: abdomen soft Skin: No lesions in the area of chief complaint Neurologic: Sensation intact distally Psychiatric: Patient is competent for consent with normal mood and affect Lymphatic: no lymphedema  MUSCULOSKELETAL: exam stable  Assessment: LEFT CARPAL TUNNEL SYNDROME  Plan: Plan for Procedure(s): LEFT CARPAL TUNNEL RELEASE  The risks benefits and alternatives were discussed with the patient including but not limited to the risks of nonoperative treatment, versus surgical intervention including infection, bleeding, nerve injury,  blood clots, cardiopulmonary complications, morbidity, mortality, among others, and they were willing to proceed.   Preoperative templating of the joint replacement has been completed, documented, and submitted to the Operating Room personnel in order to optimize intra-operative equipment management.   Glee Arvin, MD 04/09/2021  11:57 AM

## 2021-04-09 NOTE — Transfer of Care (Signed)
Immediate Anesthesia Transfer of Care Note  Patient: Carrie Buchanan  Procedure(s) Performed: LEFT CARPAL TUNNEL RELEASE (Left Wrist)  Patient Location: PACU  Anesthesia Type:Bier block  Level of Consciousness: drowsy and patient cooperative  Airway & Oxygen Therapy: Patient Spontanous Breathing and Patient connected to face mask oxygen  Post-op Assessment: Report given to RN and Post -op Vital signs reviewed and stable  Post vital signs: Reviewed and stable  Last Vitals:  Vitals Value Taken Time  BP    Temp    Pulse 67 04/09/21 1244  Resp 17 04/09/21 1244  SpO2 99 % 04/09/21 1244  Vitals shown include unvalidated device data.  Last Pain:  Vitals:   04/09/21 1108  TempSrc: Oral  PainSc: 0-No pain         Complications: No complications documented.

## 2021-04-09 NOTE — Op Note (Signed)
   Carpal tunnel op note  DATE OF SURGERY:04/09/2021  PREOPERATIVE DIAGNOSIS:  Left carpal tunnel syndrome  POSTOPERATIVE DIAGNOSIS: same  PROCEDURE: Left carpal tunnel release. CPT 32355  SURGEON: Marianna Payment, M.D.  ASSIST: Madalyn Rob, PA-C  ANESTHESIA:  Bier and MAC  TOURNIQUET TIME: less than 20 minutes  BLOOD LOSS: Minimal.  COMPLICATIONS: None.  PATHOLOGY: None.  INDICATIONS: The patient is a 45 y.o. -year-old female who presented with carpal tunnel syndrome failing nonsurgical management, indicated for surgical release.  DESCRIPTION OF PROCEDURE: The patient was identified in the preoperative holding area.  The operative site was marked by the surgeon and confirmed by the patient.  The patient was brought back to the operating room.  MAC and Bier block anesthesia was administered.  Local anesthetic with epi was injected into the operative site.  A well padded nonsterile tourniquet was placed. The operative extremity was prepped and draped in standard sterile fashion.  A timeout was performed.  Preoperative antibiotics were given.   A palmar incision was made about 5 mm ulnar to the thenar crease.  The palmar aponeurosis was exposed and divided in line with the skin incision. The palmaris brevis was visualized and divided.  The distal edge of the transcarpal ligament was identified. A hemostat was inserted into the carpal tunnel to protect the median nerve and the flexor tendons. Then, the transverse carpal ligament was released under direct visualization. Proximally, a subcutaneous tunnel was made allowing a Sewell retractor to be placed. Then, the distal portion of the antebrachial fascia was released. Distally, all fibrous bands were released. The median nerve was visualized, and the fat pad was exposed. Wound was irrigated and closed with 4-0 nylon sutures. Sterile dressing applied. The patient was transferred to the recovery room in stable condition after all  counts were correct.  POSTOPERATIVE PLAN: To start nerve gliding exercises as tolerated and no heavy lifting for four weeks.  Eduard Roux, M.D. [facility] 12:32 PM

## 2021-04-09 NOTE — Anesthesia Procedure Notes (Signed)
Procedure Name: MAC Date/Time: 04/09/2021 12:23 PM Performed by: Signe Colt, CRNA Pre-anesthesia Checklist: Patient identified, Emergency Drugs available, Suction available, Patient being monitored and Timeout performed Patient Re-evaluated:Patient Re-evaluated prior to induction Oxygen Delivery Method: Simple face mask

## 2021-04-09 NOTE — Anesthesia Postprocedure Evaluation (Signed)
Anesthesia Post Note  Patient: Carrie Buchanan  Procedure(s) Performed: LEFT CARPAL TUNNEL RELEASE (Left Wrist)     Patient location during evaluation: PACU Anesthesia Type: MAC and Bier Block Level of consciousness: awake and alert Pain management: pain level controlled Vital Signs Assessment: post-procedure vital signs reviewed and stable Respiratory status: spontaneous breathing, nonlabored ventilation and respiratory function stable Cardiovascular status: stable and blood pressure returned to baseline Postop Assessment: no apparent nausea or vomiting Anesthetic complications: no   No complications documented.  Last Vitals:  Vitals:   04/09/21 1300 04/09/21 1315  BP: 121/70 133/76  Pulse: 62 66  Resp: 14 15  Temp:    SpO2: 100% 99%    Last Pain:  Vitals:   04/09/21 1300  TempSrc:   PainSc: 0-No pain                 Sapir Lavey A.

## 2021-04-09 NOTE — Anesthesia Preprocedure Evaluation (Addendum)
Anesthesia Evaluation  Patient identified by MRN, date of birth, ID band Patient awake    Reviewed: Allergy & Precautions, NPO status , Patient's Chart, lab work & pertinent test results  Airway Mallampati: II  TM Distance: >3 FB Neck ROM: Full    Dental no notable dental hx. (+) Dental Advisory Given, Upper Dentures, Missing,    Pulmonary neg pulmonary ROS,    Pulmonary exam normal breath sounds clear to auscultation       Cardiovascular negative cardio ROS Normal cardiovascular exam Rhythm:Regular Rate:Normal     Neuro/Psych Left Carpal Tunnel syndrome Myofascial pain syndrome  Neuromuscular disease negative psych ROS   GI/Hepatic negative GI ROS, Neg liver ROS,   Endo/Other  diabetes, Poorly Controlled, Type 2, Oral Hypoglycemic Agents  Renal/GU negative Renal ROS  negative genitourinary   Musculoskeletal negative musculoskeletal ROS (+)   Abdominal   Peds  Hematology negative hematology ROS (+)   Anesthesia Other Findings   Reproductive/Obstetrics                           Anesthesia Physical Anesthesia Plan  ASA: II  Anesthesia Plan: Bier Block and MAC and Bier Block-LIDOCAINE ONLY   Post-op Pain Management:    Induction: Intravenous  PONV Risk Score and Plan: 3 and Treatment may vary due to age or medical condition, Midazolam, Ondansetron and Dexamethasone  Airway Management Planned: Natural Airway, Simple Face Mask and Nasal Cannula  Additional Equipment:   Intra-op Plan:   Post-operative Plan:   Informed Consent: I have reviewed the patients History and Physical, chart, labs and discussed the procedure including the risks, benefits and alternatives for the proposed anesthesia with the patient or authorized representative who has indicated his/her understanding and acceptance.     Dental advisory given  Plan Discussed with: CRNA and Anesthesiologist  Anesthesia  Plan Comments: (Spanish interpreter used during preop.)      Anesthesia Quick Evaluation

## 2021-04-09 NOTE — Discharge Instructions (Signed)
 Postoperative instructions:  Weightbearing instructions: no lifting more than 10 lbs  Dressing instructions: Keep your dressing and/or splint clean and dry at all times.  It will be removed at your first post-operative appointment.  Your stitches and/or staples will be removed at this visit.  Incision instructions:  Do not soak your incision for 3 weeks after surgery.  If the incision gets wet, pat dry and do not scrub the incision.  Pain control:  You have been given a prescription to be taken as directed for post-operative pain control.  In addition, elevate the operative extremity above the heart at all times to prevent swelling and throbbing pain.  Take over-the-counter Colace, 100mg by mouth twice a day while taking narcotic pain medications to help prevent constipation.  Follow up appointments: 1) 7 days for wound check. 2) Dr. Xu as scheduled.   -------------------------------------------------------------------------------------------------------------  After Surgery Pain Control:  After your surgery, post-surgical discomfort or pain is likely. This discomfort can last several days to a few weeks. At certain times of the day your discomfort may be more intense.  Did you receive a nerve block?  A nerve block can provide pain relief for one hour to two days after your surgery. As long as the nerve block is working, you will experience little or no sensation in the area the surgeon operated on.  As the nerve block wears off, you will begin to experience pain or discomfort. It is very important that you begin taking your prescribed pain medication before the nerve block fully wears off. Treating your pain at the first sign of the block wearing off will ensure your pain is better controlled and more tolerable when full-sensation returns. Do not wait until the pain is intolerable, as the medicine will be less effective. It is better to treat pain in advance than to try and catch up.    General Anesthesia:  If you did not receive a nerve block during your surgery, you will need to start taking your pain medication shortly after your surgery and should continue to do so as prescribed by your surgeon.  Pain Medication:  Most commonly we prescribe Vicodin and Percocet for post-operative pain. Both of these medications contain a combination of acetaminophen (Tylenol) and a narcotic to help control pain.   It takes between 30 and 45 minutes before pain medication starts to work. It is important to take your medication before your pain level gets too intense.   Nausea is a common side effect of many pain medications. You will want to eat something before taking your pain medicine to help prevent nausea.   If you are taking a prescription pain medication that contains acetaminophen, we recommend that you do not take additional over the counter acetaminophen (Tylenol).  Other pain relieving options:   Using a cold pack to ice the affected area a few times a day (15 to 20 minutes at a time) can help to relieve pain, reduce swelling and bruising.   Elevation of the affected area can also help to reduce pain and swelling.   Post Anesthesia Home Care Instructions  Activity: Get plenty of rest for the remainder of the day. A responsible individual must stay with you for 24 hours following the procedure.  For the next 24 hours, DO NOT: -Drive a car -Operate machinery -Drink alcoholic beverages -Take any medication unless instructed by your physician -Make any legal decisions or sign important papers.  Meals: Start with liquid foods such as gelatin   or soup. Progress to regular foods as tolerated. Avoid greasy, spicy, heavy foods. If nausea and/or vomiting occur, drink only clear liquids until the nausea and/or vomiting subsides. Call your physician if vomiting continues.  Special Instructions/Symptoms: Your throat may feel dry or sore from the anesthesia or the breathing tube  placed in your throat during surgery. If this causes discomfort, gargle with warm salt water. The discomfort should disappear within 24 hours.  If you had a scopolamine patch placed behind your ear for the management of post- operative nausea and/or vomiting:  1. The medication in the patch is effective for 72 hours, after which it should be removed.  Wrap patch in a tissue and discard in the trash. Wash hands thoroughly with soap and water. 2. You may remove the patch earlier than 72 hours if you experience unpleasant side effects which may include dry mouth, dizziness or visual disturbances. 3. Avoid touching the patch. Wash your hands with soap and water after contact with the patch.      

## 2021-04-10 ENCOUNTER — Encounter (HOSPITAL_BASED_OUTPATIENT_CLINIC_OR_DEPARTMENT_OTHER): Payer: Self-pay | Admitting: Orthopaedic Surgery

## 2021-04-16 ENCOUNTER — Ambulatory Visit (INDEPENDENT_AMBULATORY_CARE_PROVIDER_SITE_OTHER): Payer: Self-pay | Admitting: Physician Assistant

## 2021-04-16 ENCOUNTER — Other Ambulatory Visit: Payer: Self-pay

## 2021-04-16 ENCOUNTER — Encounter: Payer: Self-pay | Admitting: Orthopaedic Surgery

## 2021-04-16 DIAGNOSIS — Z9889 Other specified postprocedural states: Secondary | ICD-10-CM

## 2021-04-16 DIAGNOSIS — G5602 Carpal tunnel syndrome, left upper limb: Secondary | ICD-10-CM

## 2021-04-16 NOTE — Progress Notes (Signed)
   Post-Op Visit Note   Patient: Carrie Buchanan           Date of Birth: [birth year]           MRN: [id number] Visit Date: 04/16/2021 PCP: Claiborne Rigg, NP   Assessment & Plan:  Chief Complaint:  Chief Complaint  Patient presents with   Left Hand - Pain   Visit Diagnoses:  1. Carpal tunnel syndrome on left   2. S/P carpal tunnel release     Plan: Patient is a pleasant 45 year old female who comes in today 1 week out left carpal tunnel release.  She has been doing well.  No pain.  No paresthesias.  Examination of her left wrist reveals a well-healing surgical incision with nylon sutures in place.  No evidence of infection or cellulitis.  Fingers are warm and well-perfused.  She is neurovascular intact distally.  Today, the wound was cleaned and recovered.  Removable Velcro splint was applied which she will wear for the next week.  No heavy lifting or submerging her hand in water for another 3 weeks.  Follow-up with [place] next week for suture removal.  Call with concerns or questions in the meantime.  Follow-Up Instructions: Return in about 1 week (around 04/23/2021).   Orders:  No orders of the defined types were placed in this encounter.  No orders of the defined types were placed in this encounter.   Imaging: No new imaging  PMFS History: Patient Active Problem List   Diagnosis Date Noted   Carpal tunnel syndrome on left 04/09/2021   Chronic pain syndrome 02/16/2020   Impingement syndrome of right shoulder 08/02/2019   Myofascial pain syndrome 08/02/2019   IFG (impaired fasting glucose) 07/11/2014   High triglycerides 07/11/2014   Appendicitis, acute, with peritonitis 02/20/2013   Twin pregnancy, delivered vaginally, current hospitalization 04/21/2012   Multiparity 02/18/2012   Short cervix, antepartum, twins 01/14/2012   Twin pregnancy, antepartum 01/01/2012   AMA (advanced maternal age) multigravida 35+ 01/01/2012   Late prenatal care 01/01/2012    High-risk pregnancy supervision 01/01/2012   Past Medical History:  Diagnosis Date   Appendicitis, acute, with peritonitis 02/20/2013   Prediabetes     Family History  Problem Relation Age of Onset   Asthma Mother     Past Surgical History:  Procedure Laterality Date   APPENDECTOMY     CARPAL TUNNEL RELEASE Left 04/09/2021   Procedure: LEFT CARPAL TUNNEL RELEASE;  Surgeon: Tarry Kos, MD;  Location: [facility];  Service: Orthopedics;  Laterality: Left;   INTRAUTERINE DEVICE INSERTION  09/2012   LAPAROSCOPIC APPENDECTOMY N/A 02/09/2013   Procedure: APPENDECTOMY LAPAROSCOPIC;  Surgeon: Emelia Loron, MD;  Location: [facility] OR;  Service: General;  Laterality: N/A;   Social History   Occupational History   Not on file  Tobacco Use   Smoking status: Never   Smokeless tobacco: Never  Vaping Use   Vaping Use: Never used  Substance and Sexual Activity   Alcohol use: No   Drug use: No   Sexual activity: Not Currently

## 2021-04-23 ENCOUNTER — Other Ambulatory Visit: Payer: Self-pay

## 2021-04-23 ENCOUNTER — Ambulatory Visit (INDEPENDENT_AMBULATORY_CARE_PROVIDER_SITE_OTHER): Payer: Self-pay | Admitting: Orthopaedic Surgery

## 2021-04-23 ENCOUNTER — Encounter: Payer: Self-pay | Admitting: Orthopaedic Surgery

## 2021-04-23 DIAGNOSIS — Z9889 Other specified postprocedural states: Secondary | ICD-10-CM

## 2021-04-23 NOTE — Progress Notes (Signed)
   Post-Op Visit Note   Patient: Carrie Buchanan           Date of Birth: June 28, 1976           MRN: [id number] Visit Date: 04/23/2021 PCP: Claiborne Rigg, NP   Assessment & Plan:  Chief Complaint:  Chief Complaint  Patient presents with   Left Hand - Pain   Visit Diagnoses:  1. S/P carpal tunnel release     Plan: Carrie Buchanan is a 2-week status post left carpal tunnel release.  She has already felt an improvement in her carpal tunnel symptoms.  She is complaining of some left shoulder discomfort and stiffness that started after surgery.  She is having some trouble reaching backwards.  She is always had some baseline discomfort in the left shoulder which has been exacerbated by the recent surgery.  Left hand shows healed surgical incision.  Sutures intact.  No signs of infection.  Neurovascular intact.  Left shoulder shows mild to moderate limitation range of motion secondary to discomfort.  Strength is grossly intact.  Sutures removed and Steri-Strips applied.  Wound care instructions reviewed.  Activity restrictions reviewed as well.  In terms of the shoulder I think this is related to positioning of the arm for the surgery which is exacerbated some underlying shoulder pathology.  We talked about the option of doing cortisone injection versus giving it some time and taking some NSAIDs.  She elected to go with time and NSAIDs.  We will recheck her in about 4 weeks.  Follow-Up Instructions: Return in about 4 weeks (around 05/21/2021).   Orders:  No orders of the defined types were placed in this encounter.  No orders of the defined types were placed in this encounter.   Imaging: No results found.  PMFS History: Patient Active Problem List   Diagnosis Date Noted   Carpal tunnel syndrome on left 04/09/2021   Chronic pain syndrome 02/16/2020   Impingement syndrome of right shoulder 08/02/2019   Myofascial pain syndrome 08/02/2019   IFG (impaired fasting glucose)  07/11/2014   High triglycerides 07/11/2014   Appendicitis, acute, with peritonitis 02/20/2013   Twin pregnancy, delivered vaginally, current hospitalization 04/21/2012   Multiparity 02/18/2012   Short cervix, antepartum, twins 01/14/2012   Twin pregnancy, antepartum 01/01/2012   AMA (advanced maternal age) multigravida 35+ 01/01/2012   Late prenatal care 01/01/2012   High-risk pregnancy supervision 01/01/2012   Past Medical History:  Diagnosis Date   Appendicitis, acute, with peritonitis 02/20/2013   Prediabetes     Family History  Problem Relation Age of Onset   Asthma Mother     Past Surgical History:  Procedure Laterality Date   APPENDECTOMY     CARPAL TUNNEL RELEASE Left 04/09/2021   Procedure: LEFT CARPAL TUNNEL RELEASE;  Surgeon: Tarry Kos, MD;  Location: [facility];  Service: Orthopedics;  Laterality: Left;   INTRAUTERINE DEVICE INSERTION  09/2012   LAPAROSCOPIC APPENDECTOMY N/A 02/09/2013   Procedure: APPENDECTOMY LAPAROSCOPIC;  Surgeon: Emelia Loron, MD;  Location: [facility];  Service: General;  Laterality: N/A;   Social History   Occupational History   Not on file  Tobacco Use   Smoking status: Never   Smokeless tobacco: Never  Vaping Use   Vaping Use: Never used  Substance and Sexual Activity   Alcohol use: No   Drug use: No   Sexual activity: Not Currently

## 2021-05-21 ENCOUNTER — Ambulatory Visit (INDEPENDENT_AMBULATORY_CARE_PROVIDER_SITE_OTHER): Payer: Self-pay

## 2021-05-21 ENCOUNTER — Ambulatory Visit (INDEPENDENT_AMBULATORY_CARE_PROVIDER_SITE_OTHER): Payer: Self-pay | Admitting: Orthopaedic Surgery

## 2021-05-21 ENCOUNTER — Encounter: Payer: Self-pay | Admitting: Orthopaedic Surgery

## 2021-05-21 VITALS — Ht 62.0 in | Wt 147.0 lb

## 2021-05-21 DIAGNOSIS — M25512 Pain in left shoulder: Secondary | ICD-10-CM

## 2021-05-21 DIAGNOSIS — G5602 Carpal tunnel syndrome, left upper limb: Secondary | ICD-10-CM

## 2021-05-21 DIAGNOSIS — G8929 Other chronic pain: Secondary | ICD-10-CM

## 2021-05-21 DIAGNOSIS — Z9889 Other specified postprocedural states: Secondary | ICD-10-CM

## 2021-05-21 MED ORDER — BUPIVACAINE HCL 0.25 % IJ SOLN
2.0000 mL | INTRAMUSCULAR | Status: AC | PRN
  Administered 2021-05-21: 2 mL via INTRA_ARTICULAR

## 2021-05-21 MED ORDER — METHYLPREDNISOLONE ACETATE 40 MG/ML IJ SUSP
40.0000 mg | INTRAMUSCULAR | Status: AC | PRN
  Administered 2021-05-21: 40 mg via INTRA_ARTICULAR

## 2021-05-21 MED ORDER — LIDOCAINE HCL 2 % IJ SOLN
2.0000 mL | INTRAMUSCULAR | Status: AC | PRN
  Administered 2021-05-21: 2 mL

## 2021-05-21 NOTE — Progress Notes (Signed)
Office Visit Note   Patient: Carrie Buchanan           Date of Birth: [birth year]           MRN: [id number] Visit Date: 05/21/2021              Requested by: Claiborne Rigg, NP [address] [place],  [place] [address] PCP: Claiborne Rigg, NP   Assessment & Plan: Visit Diagnoses:  1. Chronic left shoulder pain   2. Left carpal tunnel syndrome   3. S/P carpal tunnel release     Plan: Impression is status post left carpal tunnel release and left shoulder subacromial bursitis.  In regards to the carpal tunnel release, she is doing well.  I have encouraged her to pick up a stress ball or putty to work on range of motion and strengthening.  In regards to the shoulder, we discussed subacromial cortisone injection as well as a Jobe exercise program.  She agreed with proceeding with both.  She tolerated the injection well.  She will let us know in a few weeks should her symptoms not improve and at that point we may entertain an MRI.  Otherwise, follow-up with [place] as needed.  Follow-Up Instructions: Return if symptoms worsen or fail to improve.   Orders:  Orders Placed This Encounter  Procedures   Large Joint Inj   XR Shoulder Left   No orders of the defined types were placed in this encounter.     Procedures: Large Joint Inj: L subacromial bursa on 05/21/2021 8:49 AM Indications: pain Details: 22 G needle Medications: 2 mL lidocaine 2 %; 2 mL bupivacaine 0.25 %; 40 mg methylPREDNISolone acetate 40 MG/ML Outcome: tolerated well, no immediate complications Patient was prepped and draped in the usual sterile fashion.      Clinical Data: No additional findings.   Subjective: Chief Complaint  Patient presents with   Left Wrist - Follow-up    Left carpal tunnel release 04/09/2021   Left Shoulder - Pain    HPI patient is a pleasant 45 year old Spanish-speaking female who is here today with an interpreter.  She has 6 weeks status post left carpal tunnel release  04/09/2021.  She is doing well there without complaints of pain or paresthesias.  She does have slight limitation with making a full composite fist.  The main issue she brings up today is continued left shoulder pain.  She did have some underlying left shoulder pain prior to surgical intervention but has had increased pain since carpal tunnel surgery.  All of her pain is to the deltoid.  She notes mild weakness.  Pain is worse with external and internal rotation.  She has been taking Naprosyn with some relief.  She thinks she may have had a previous cortisone injection to the left shoulder years ago which did help.  Review of Systems as detailed in HPI.  All others reviewed and are negative.   Objective: Vital Signs: Ht 5\' 2"  (1.575 m)   Wt 147 lb (66.7 kg)   BMI 26.89 kg/m   Physical Exam well-developed well-nourished female no acute distress.  Alert and oriented x3.  Ortho Exam left wrist exam shows a fully healed surgical scar without complication.  She has slight limitation with making a full composite fist.  Fingers warm well perfused.  Left shoulder exam shows forward flexion to approximately 160 degrees.  This is with pain.  She has about 45 degrees of external and internal rotation.  Empty can and mildly positive cross body adduction.  No tenderness to the [facility] joint.  Negative speeds negative O'Brien's.  She is neurovascular intact distally.  Specialty Comments:  No specialty comments available.  Imaging: XR Shoulder Left  Result Date: 05/21/2021 No acute or structural abnormalities    PMFS History: Patient Active Problem List   Diagnosis Date Noted   Carpal tunnel syndrome on left 04/09/2021   Chronic pain syndrome 02/16/2020   Impingement syndrome of right shoulder 08/02/2019   Myofascial pain syndrome 08/02/2019   IFG (impaired fasting glucose) 07/11/2014   High triglycerides 07/11/2014   Appendicitis, acute, with peritonitis 02/20/2013   Twin pregnancy, delivered vaginally,  current hospitalization 04/21/2012   Multiparity 02/18/2012   Short cervix, antepartum, twins 01/14/2012   Twin pregnancy, antepartum 01/01/2012   AMA (advanced maternal age) multigravida 35+ 01/01/2012   Late prenatal care 01/01/2012   High-risk pregnancy supervision 01/01/2012   Past Medical History:  Diagnosis Date   Appendicitis, acute, with peritonitis 02/20/2013   Prediabetes     Family History  Problem Relation Age of Onset   Asthma Mother     Past Surgical History:  Procedure Laterality Date   APPENDECTOMY     CARPAL TUNNEL RELEASE Left 04/09/2021   Procedure: LEFT CARPAL TUNNEL RELEASE;  Surgeon: Tarry Kos, MD;  Location: [facility];  Service: Orthopedics;  Laterality: Left;   INTRAUTERINE DEVICE INSERTION  09/2012   LAPAROSCOPIC APPENDECTOMY N/A 02/09/2013   Procedure: APPENDECTOMY LAPAROSCOPIC;  Surgeon: Emelia Loron, MD;  Location: [facility];  Service: General;  Laterality: N/A;   Social History   Occupational History   Not on file  Tobacco Use   Smoking status: Never   Smokeless tobacco: Never  Vaping Use   Vaping Use: Never used  Substance and Sexual Activity   Alcohol use: No   Drug use: No   Sexual activity: Not Currently

## 2023-02-24 ENCOUNTER — Encounter: Payer: Self-pay | Admitting: Nurse Practitioner

## 2023-02-24 ENCOUNTER — Other Ambulatory Visit: Payer: Self-pay

## 2023-02-24 ENCOUNTER — Ambulatory Visit: Payer: Self-pay | Attending: Nurse Practitioner | Admitting: Nurse Practitioner

## 2023-02-24 VITALS — BP 114/68 | HR 75 | Ht 62.0 in | Wt 145.2 lb

## 2023-02-24 DIAGNOSIS — E1165 Type 2 diabetes mellitus with hyperglycemia: Secondary | ICD-10-CM

## 2023-02-24 DIAGNOSIS — Z1231 Encounter for screening mammogram for malignant neoplasm of breast: Secondary | ICD-10-CM

## 2023-02-24 DIAGNOSIS — E785 Hyperlipidemia, unspecified: Secondary | ICD-10-CM

## 2023-02-24 DIAGNOSIS — D72829 Elevated white blood cell count, unspecified: Secondary | ICD-10-CM

## 2023-02-24 DIAGNOSIS — Z1211 Encounter for screening for malignant neoplasm of colon: Secondary | ICD-10-CM

## 2023-02-24 MED ORDER — METFORMIN HCL ER 500 MG PO TB24
500.0000 mg | ORAL_TABLET | Freq: Every day | ORAL | 3 refills | Status: AC
  Filled 2023-02-24: qty 90, 90d supply, fill #0

## 2023-02-24 NOTE — Progress Notes (Signed)
Assessment & PlaLuccia:  Carrie Buchanan was seen today for diabetes.  Diagnoses and all orders for this visit:  Type 2 diabetes mellitus with hyperglycemia, without long-term current use of insulin -     Microalbumin / creatinine urine ratio -     CMP14+EGFR -     metFORMIN (GLUCOPHAGE-XR) 500 MG 24 hr tablet; TAKE 1 TABLET (500 MG TOTAL) BY MOUTH DAILY WITH BREAKFAST. -     Hemoglobin A1c Continue blood sugar control as discussed in office today, low carbohydrate diet, and regular physical exercise as tolerated, 150 minutes per week (30 min each day, 5 days per week, or 50 min 3 days per week). Keep blood sugar logs with fasting goal of 90-130 mg/dl, post prandial (after you eat) less than 180.  For Hypoglycemia: BS <60 and Hyperglycemia BS >400; contact the clinic ASAP. Annual eye exams and foot exams are recommended.   Colon cancer screening -     Fecal occult blood, imunochemical  Breast cancer screening by mammogram -     MS 3D SCR MAMMO BILAT BR (aka MM); Future  Dyslipidemia, goal LDL below 70 -     Lipid panel  Leukocytosis, unspecified type -     CBC with Differential    Patient has been counseled on age-appropriate routine health concerns for screening and prevention. These are reviewed and up-to-date. Referrals have been placed accordingly. Immunizations are up-to-date or declined.    Subjective:   Chief Complaint  Patient presents with   Diabetes    Carrie Buchanan 47 y.o. female presents to office today for follow up to DM  VRI was used to communicate directly with patient for the entire encounter including providing detailed patient instructions.    She has a PMH of DM. I have not seen her in a few years. States she has been feeling well with no concerns today  Patient has been counseled on age-appropriate routine health concerns for screening and prevention. These are reviewed and up-to-date. Referrals have been placed accordingly. Immunizations are  up-to-date or declined. MAMMOGRAM: OVERDUE. Ordered today COLON CANCER SCREENING: OVERDUE. Ordered today     PAP SMEAR: OVERDUE. Schedule next office visit  DM 2 She has not been taking metformin XR. We will resend today.  Lab Results  Component Value Date   HGBA1C 6.7 (H) 12/13/2020    Blood pressure is well controlled.  BP Readings from Last 3 Encounters:  02/24/23 114/68  04/09/21 128/81  12/13/20 119/78     Arthralgias She has chronic left shoulder pain. Has seen ortho in the past and received a steroid injection. Currently with mild pain in the shoulder but not as severe. Tylenol OTC can relieve pain.   Review of Systems  Constitutional:  Negative for fever, malaise/fatigue and weight loss.  HENT: Negative.  Negative for nosebleeds.   Eyes: Negative.  Negative for blurred vision, double vision and photophobia.  Respiratory: Negative.  Negative for cough and shortness of breath.   Cardiovascular: Negative.  Negative for chest pain, palpitations and leg swelling.  Gastrointestinal: Negative.  Negative for heartburn, nausea and vomiting.  Musculoskeletal:  Positive for joint pain. Negative for myalgias.  Neurological: Negative.  Negative for dizziness, focal weakness, seizures and headaches.  Psychiatric/Behavioral: Negative.  Negative for suicidal ideas.     Past Medical History:  Diagnosis Date   Appendicitis, acute, with peritonitis 02/20/2013   Diabetes mellitus without complication    Prediabetes     Past Surgical History:  Procedure Laterality  Date   APPENDECTOMY     CARPAL TUNNEL RELEASE Left 04/09/2021   Procedure: LEFT CARPAL TUNNEL RELEASE;  Surgeon: Tarry Kos, MD;  Location: [facility];  Service: Orthopedics;  Laterality: Left;   INTRAUTERINE DEVICE INSERTION  09/2012   LAPAROSCOPIC APPENDECTOMY N/A 02/09/2013   Procedure: APPENDECTOMY LAPAROSCOPIC;  Surgeon: Emelia Loron, MD;  Location: [facility];  Service: General;  Laterality: N/A;     Family History  Problem Relation Age of Onset   Asthma Mother     Social History Reviewed with no changes to be made today.   Outpatient Medications Prior to Visit  Medication Sig Dispense Refill   etonogestrel (NEXPLANON) 68 MG IMPL implant 1 each by Subdermal route once.     Cholecalciferol (VITAMIN D3) 2000 units CHEW Chew by mouth. (Patient not taking: Reported on 02/24/2023)     cyclobenzaprine (FLEXERIL) 5 MG tablet Take 1 tablet (5 mg total) by mouth 3 (three) times daily as needed for muscle spasms. (Patient not taking: Reported on 02/24/2023) 60 tablet 1   gabapentin (NEURONTIN) 300 MG capsule TAKE 1 CAPSULE (300 MG TOTAL) BY MOUTH 3 (THREE) TIMES DAILY. (Patient not taking: Reported on 02/24/2023) 90 capsule 3   HYDROcodone-acetaminophen (NORCO) 5-325 MG tablet Take 1 tablet by mouth every 6 (six) hours as needed. (Patient not taking: Reported on 02/24/2023) 20 tablet 0   metFORMIN (GLUCOPHAGE-XR) 500 MG 24 hr tablet TAKE 1 TABLET (500 MG TOTAL) BY MOUTH DAILY WITH BREAKFAST. (Patient not taking: Reported on 02/24/2023) 90 tablet 3   No facility-administered medications prior to visit.    No Known Allergies     Objective:    BP 114/68   Pulse 75   Ht 5\' 2"  (1.575 m)   Wt 145 lb 3.2 oz (65.9 kg)   LMP  (LMP Unknown)   SpO2 98%   BMI 26.56 kg/m  Wt Readings from Last 3 Encounters:  02/24/23 145 lb 3.2 oz (65.9 kg)  05/21/21 147 lb (66.7 kg)  04/09/21 147 lb 4.3 oz (66.8 kg)    Physical Exam Vitals and nursing note reviewed.  Constitutional:      Appearance: She is well-developed.  HENT:     Head: Normocephalic and atraumatic.  Cardiovascular:     Rate and Rhythm: Normal rate and regular rhythm.     Heart sounds: Normal heart sounds. No murmur heard.    No friction rub. No gallop.  Pulmonary:     Effort: Pulmonary effort is normal. No tachypnea or respiratory distress.     Breath sounds: Normal breath sounds. No decreased breath sounds, wheezing, rhonchi or  rales.  Chest:     Chest wall: No tenderness.  Abdominal:     General: Bowel sounds are normal.     Palpations: Abdomen is soft.  Musculoskeletal:        General: Normal range of motion.     Cervical back: Normal range of motion.  Skin:    General: Skin is warm and dry.  Neurological:     Mental Status: She is alert and oriented to person, place, and time.     Coordination: Coordination normal.  Psychiatric:        Behavior: Behavior normal. Behavior is cooperative.        Thought Content: Thought content normal.        Judgment: Judgment normal.          Patient has been counseled extensively about nutrition and exercise as well as the  importance of adherence with medications and regular follow-up. The patient was given clear instructions to go to ER or return to medical center if symptoms don't improve, worsen or new problems develop. The patient verbalized understanding.   Follow-up: Return in about 6 months (around 08/26/2023) for PAP SMEAR.   Claiborne Rigg, FNP-BC [facility] [place], [place] [phone]   02/24/2023, 3:17 PM

## 2023-02-25 LAB — CMP14+EGFR
ALT: 21 IU/L (ref 0–32)
AST: 21 IU/L (ref 0–40)
Albumin/Globulin Ratio: 1.6 (ref 1.2–2.2)
Albumin: 4.2 g/dL (ref 3.9–4.9)
Alkaline Phosphatase: 93 IU/L (ref 44–121)
BUN/Creatinine Ratio: 17 (ref 9–23)
BUN: 17 mg/dL (ref 6–24)
Bilirubin Total: 0.3 mg/dL (ref 0.0–1.2)
CO2: 24 mmol/L (ref 20–29)
Calcium: 9.4 mg/dL (ref 8.7–10.2)
Chloride: 104 mmol/L (ref 96–106)
Creatinine, Ser: 0.98 mg/dL (ref 0.57–1.00)
Globulin, Total: 2.6 g/dL (ref 1.5–4.5)
Glucose: 147 mg/dL — ABNORMAL HIGH (ref 70–99)
Potassium: 4 mmol/L (ref 3.5–5.2)
Sodium: 142 mmol/L (ref 134–144)
Total Protein: 6.8 g/dL (ref 6.0–8.5)
eGFR: 71 mL/min/{1.73_m2} (ref >59)

## 2023-02-25 LAB — MICROALBUMIN / CREATININE URINE RATIO
Creatinine, Urine: 49.1 mg/dL
Microalb/Creat Ratio: <6 mg/g creat (ref 0–29)
Microalbumin, Urine: <3 ug/mL

## 2023-04-01 ENCOUNTER — Ambulatory Visit
Admission: RE | Admit: 2023-04-01 | Discharge: 2023-04-01 | Disposition: A | Discharge status: Home / Self Care | Payer: No Typology Code available for payment source | Source: Ambulatory Visit | Attending: Nurse Practitioner | Admitting: Nurse Practitioner

## 2023-04-01 DIAGNOSIS — Z1231 Encounter for screening mammogram for malignant neoplasm of breast: Secondary | ICD-10-CM

## 2023-06-24 ENCOUNTER — Other Ambulatory Visit (HOSPITAL_COMMUNITY): Payer: Self-pay

## 2023-09-15 ENCOUNTER — Encounter: Payer: Self-pay | Admitting: Obstetrics & Gynecology

## 2023-10-22 ENCOUNTER — Other Ambulatory Visit (HOSPITAL_COMMUNITY)
Admission: RE | Admit: 2023-10-22 | Discharge: 2023-10-22 | Disposition: A | Discharge status: Home / Self Care | Payer: No Typology Code available for payment source | Source: Ambulatory Visit | Attending: Obstetrics and Gynecology | Admitting: Obstetrics and Gynecology

## 2023-10-22 ENCOUNTER — Ambulatory Visit: Payer: Self-pay | Admitting: Hematology and Oncology

## 2023-10-22 VITALS — BP 108/69 | Wt 145.0 lb

## 2023-10-22 DIAGNOSIS — R87620 Atypical squamous cells of undetermined significance on cytologic smear of vagina (ASC-US): Secondary | ICD-10-CM | POA: Insufficient documentation

## 2023-10-22 DIAGNOSIS — B977 Papillomavirus as the cause of diseases classified elsewhere: Secondary | ICD-10-CM

## 2023-10-22 DIAGNOSIS — N72 Inflammatory disease of cervix uteri: Secondary | ICD-10-CM | POA: Insufficient documentation

## 2023-10-22 DIAGNOSIS — Z01812 Encounter for preprocedural laboratory examination: Secondary | ICD-10-CM

## 2023-10-22 NOTE — Progress Notes (Signed)
Carrie Buchanan is a 47 y.o. female who presents to [facility] clinic today with no complaints.    Pap Smear: Pap not smear completed today. Last Pap smear was 09/01/2023 and was abnormal - ASCUS/ HPV+ . Per patient has no history of an abnormal Pap smear. Last Pap smear result is available in Epic.   Physical exam: Breasts Breasts symmetrical. No skin abnormalities bilateral breasts. No nipple retraction bilateral breasts. No nipple discharge bilateral breasts. No lymphadenopathy. No lumps palpated bilateral breasts.       Pelvic/Bimanual Pap is not indicated today  Teacher, early years/pre   GYNECOLOGY CLINIC COLPOSCOPY PROCEDURE NOTE  Ms. Carrie Buchanan is a 47 y.o. [id number] here for colposcopy for ASCUS with POSITIVE high risk HPV pap smear on 09/01/2023. Discussed role for HPV in cervical dysplasia, need for surveillance.  Patient given informed consent, signed copy in the chart, time out was performed.  Placed in lithotomy position. Cervix viewed with speculum and colposcope after application of acetic acid.   Colposcopy adequate? Yes  no visible lesions.  ECC specimen obtained. All specimens were labelled and sent to pathology.  Patient was given post procedure instructions.  Will follow up pathology and manage accordingly.  Routine preventative health maintenance measures emphasized.    Smoking History: Patient has never smoked and was not referred to quit line.    Patient Navigation: Patient education provided. Access to services provided for patient through [facility] program. Natale Lay interpreter provided. No transportation provided   Colorectal Cancer Screening: Per patient has never had colonoscopy completed No complaints today. FIT test per Dr. Job Founds   Breast and Cervical Cancer Risk Assessment: Patient does not have family history of breast cancer, known genetic mutations, or radiation treatment to the chest before age 1. Patient  has history of cervical dysplasia, immunocompromised, or DES exposure in-utero.  Risk Scores as of Encounter on 10/22/2023     Carrie Buchanan           5-year 0.39%   Lifetime 4.25%            Last calculated by Caprice Red, CMA on 10/22/2023 at  9:32 AM        A: B[facility]CP exam without pap smear Complaint of  P: Will follow up pathology accordingly. If normal or LSIL, will repeat Pap next year. If high grade, will refer to gynecology.   Carrie Buchanan A, NP 10/22/2023 10:05 AM

## 2023-10-26 LAB — SURGICAL PATHOLOGY

## 2023-10-28 ENCOUNTER — Telehealth: Payer: Self-pay

## 2023-10-28 NOTE — Telephone Encounter (Signed)
[employer] # [id number], [employer], Patient was informed benign colposcopy results, needs to repeat Pap/HPV within 12 months, can do at next [facility] appointment. Patient verbalized understanding.
